# Patient Record
Sex: Female | Born: 1965
Health system: Southern US, Community
[De-identification: ages and names within clinical notes are randomized; demographics above are authoritative.]

## PROBLEM LIST (undated history)

## (undated) DIAGNOSIS — R112 Nausea with vomiting, unspecified: Secondary | ICD-10-CM

## (undated) DIAGNOSIS — K219 Gastro-esophageal reflux disease without esophagitis: Secondary | ICD-10-CM

## (undated) DIAGNOSIS — I10 Essential (primary) hypertension: Secondary | ICD-10-CM

## (undated) DIAGNOSIS — Z9889 Other specified postprocedural states: Secondary | ICD-10-CM

## (undated) HISTORY — PX: FOOT SURGERY: SHX648

## (undated) HISTORY — DX: Gastro-esophageal reflux disease without esophagitis: K21.9

## (undated) HISTORY — PX: BREAST CYST ASPIRATION: SHX578

## (undated) HISTORY — PX: CERVICAL SPINE SURGERY: SHX589

---

## 1999-12-17 ENCOUNTER — Ambulatory Visit (HOSPITAL_COMMUNITY): Admission: RE | Admit: 1999-12-17 | Discharge: 1999-12-17 | Payer: Self-pay | Admitting: Obstetrics and Gynecology

## 2000-02-26 ENCOUNTER — Inpatient Hospital Stay (HOSPITAL_COMMUNITY): Admission: AD | Admit: 2000-02-26 | Discharge: 2000-02-29 | Payer: Self-pay | Admitting: Obstetrics and Gynecology

## 2000-02-29 ENCOUNTER — Encounter: Payer: Self-pay | Admitting: Obstetrics and Gynecology

## 2000-02-29 ENCOUNTER — Encounter: Payer: Self-pay | Admitting: *Deleted

## 2000-03-01 ENCOUNTER — Encounter: Admission: RE | Admit: 2000-03-01 | Discharge: 2000-04-16 | Payer: Self-pay | Admitting: Obstetrics and Gynecology

## 2000-04-10 ENCOUNTER — Other Ambulatory Visit: Admission: RE | Admit: 2000-04-10 | Discharge: 2000-04-10 | Payer: Self-pay | Admitting: Obstetrics and Gynecology

## 2000-06-11 ENCOUNTER — Ambulatory Visit (HOSPITAL_COMMUNITY): Admission: RE | Admit: 2000-06-11 | Discharge: 2000-06-11 | Payer: Self-pay | Admitting: Neurosurgery

## 2000-06-11 ENCOUNTER — Encounter: Payer: Self-pay | Admitting: Neurosurgery

## 2000-10-24 ENCOUNTER — Encounter: Payer: Self-pay | Admitting: Neurosurgery

## 2000-10-30 ENCOUNTER — Encounter: Payer: Self-pay | Admitting: Neurosurgery

## 2000-10-30 ENCOUNTER — Inpatient Hospital Stay (HOSPITAL_COMMUNITY): Admission: RE | Admit: 2000-10-30 | Discharge: 2000-10-31 | Payer: Self-pay | Admitting: Neurosurgery

## 2000-11-11 ENCOUNTER — Encounter: Admission: RE | Admit: 2000-11-11 | Discharge: 2000-11-11 | Payer: Self-pay | Admitting: Neurosurgery

## 2000-11-11 ENCOUNTER — Encounter: Payer: Self-pay | Admitting: Neurosurgery

## 2000-12-12 ENCOUNTER — Encounter: Admission: RE | Admit: 2000-12-12 | Discharge: 2000-12-12 | Payer: Self-pay | Admitting: Neurosurgery

## 2000-12-12 ENCOUNTER — Encounter: Payer: Self-pay | Admitting: Neurosurgery

## 2001-05-18 ENCOUNTER — Other Ambulatory Visit: Admission: RE | Admit: 2001-05-18 | Discharge: 2001-05-18 | Payer: Self-pay | Admitting: Obstetrics and Gynecology

## 2001-12-11 ENCOUNTER — Inpatient Hospital Stay (HOSPITAL_COMMUNITY): Admission: AD | Admit: 2001-12-11 | Discharge: 2001-12-11 | Payer: Self-pay | Admitting: Obstetrics & Gynecology

## 2002-01-11 ENCOUNTER — Other Ambulatory Visit: Admission: RE | Admit: 2002-01-11 | Discharge: 2002-01-11 | Payer: Self-pay | Admitting: Obstetrics and Gynecology

## 2002-05-05 ENCOUNTER — Ambulatory Visit (HOSPITAL_COMMUNITY): Admission: RE | Admit: 2002-05-05 | Discharge: 2002-05-05 | Payer: Self-pay | Admitting: Obstetrics and Gynecology

## 2002-07-24 ENCOUNTER — Inpatient Hospital Stay (HOSPITAL_COMMUNITY): Admission: AD | Admit: 2002-07-24 | Discharge: 2002-07-26 | Payer: Self-pay | Admitting: Obstetrics and Gynecology

## 2002-09-02 ENCOUNTER — Other Ambulatory Visit: Admission: RE | Admit: 2002-09-02 | Discharge: 2002-09-02 | Payer: Self-pay | Admitting: Obstetrics & Gynecology

## 2003-09-07 ENCOUNTER — Other Ambulatory Visit: Admission: RE | Admit: 2003-09-07 | Discharge: 2003-09-07 | Payer: Self-pay | Admitting: Obstetrics and Gynecology

## 2004-11-20 ENCOUNTER — Other Ambulatory Visit: Admission: RE | Admit: 2004-11-20 | Discharge: 2004-11-20 | Payer: Self-pay | Admitting: Obstetrics and Gynecology

## 2005-12-25 ENCOUNTER — Other Ambulatory Visit: Admission: RE | Admit: 2005-12-25 | Discharge: 2005-12-25 | Payer: Self-pay | Admitting: Obstetrics and Gynecology

## 2006-05-26 ENCOUNTER — Ambulatory Visit (HOSPITAL_COMMUNITY): Admission: RE | Admit: 2006-05-26 | Discharge: 2006-05-26 | Payer: Self-pay | Admitting: Family Medicine

## 2006-06-25 ENCOUNTER — Ambulatory Visit: Payer: Self-pay | Admitting: Internal Medicine

## 2006-06-27 ENCOUNTER — Ambulatory Visit (HOSPITAL_COMMUNITY): Admission: RE | Admit: 2006-06-27 | Discharge: 2006-06-27 | Payer: Self-pay | Admitting: Internal Medicine

## 2006-06-27 ENCOUNTER — Ambulatory Visit: Payer: Self-pay | Admitting: Internal Medicine

## 2006-07-22 ENCOUNTER — Ambulatory Visit: Payer: Self-pay | Admitting: Internal Medicine

## 2010-10-10 ENCOUNTER — Ambulatory Visit: Payer: Self-pay | Admitting: Internal Medicine

## 2010-10-16 DIAGNOSIS — K219 Gastro-esophageal reflux disease without esophagitis: Secondary | ICD-10-CM | POA: Insufficient documentation

## 2010-10-16 DIAGNOSIS — R1013 Epigastric pain: Secondary | ICD-10-CM | POA: Insufficient documentation

## 2010-11-04 HISTORY — PX: BREAST BIOPSY: SHX20

## 2010-12-06 NOTE — Assessment & Plan Note (Signed)
Summary: epigastric pain/ss   Visit Type:  Initial Consult Referring Provider:  Dr. Henderson Cloud Primary Care Provider:  [facility]  Chief Complaint:  epigastric pain.  History of Present Illness:  45 year old lady referred by to Dr. Huntley Dec, OB/GYN  for further evaluation of  fullness/epigastric discomfort. She is in the process returning 3 Hemoccults to Dr. Huntley Dec. She states that she's noticed a fullness behind her xiphoid process over the past couple of weeks. I saw her back in 2007 and made a diagnosis of GERD. She was  doing very nicely on Zegerid 40 orally daily. She ran out of the prescription and went to over-the-counter agents which did not do nearly as well for this nice lady. Now she takes  OTC Zegerid 20 mg daily which is not nearly as effective in combating her reflux symptoms. She has taken 6 TUMS nightly on average. Does not have any dysphagia or odynophagia. She does over induldge in  caffeinated beverages daily and uses a lot of spicy tomato-based products. Prior EGD demonstrated a patulous EG junction ; esophagus appeared normal -  prior gallbladder ultrasound also came back normal. She has not had any melena or hematochezia.  She does not consume alcohol or smoke.  No NSAIDs. No family history of GI neoplasia.   Current Medications (verified): 1)  Zegerid Otc .... One Daily 2)  Zyrtec  (Equate) Allergy .... As Needed 3)  Sudafed .... As Needed 4)  Tums .Marland Kitchen.. 2-6 At Bedtime 5)  Tylenol .... As Needed  Allergies (verified): 1)  ! 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 Lungs:  clear to auscultation Heart:  regular rate and rhythm without murmur gallop rub Abdomen:  nondistended positive bowel sounds soft she does have minimal epigastric tenderness to palpation. I do not appreciate a mass hernia organomegaly or other abnormality.  Impression & Recommendations: Impression: 45 year old lady with long-standing GERD more poorly controlled recently with dietary indiscretion and inadequate acid suppression therapy. She reports a fullness/bulge in her epigastric area. Objectively, she has only mild tenderness in this region without other abnormality appreciated. I suspect her symptoms are more related to poorly controlled GERD and anything else at this time. I doubt we are dealing with occult gallbladder or  peptic ulcer disease.  She is self insured and acquired her medications is somewhat challenging.  Recommendations:Two-week trial of AcipHex 20 mg orally daily; I gave her prescription  and coupon to get 2 weeks' worth of therapy for free. I have also given her a prescription for generic Zegerid 40 mg capsules once daily to be started when she runs out of the AcipHex.  Reviewed antireflux lifestyle/diet. Provided her literature.  I've asked her to make contact with me in 2 weeks to let me know how she is doing. If she has a nice response in her symptoms with acid suppression  therapy, then no further evaluation would be warranted. If not, then further evaluation will be pursued. In addition, I would like to get the results of the Hemoccults testing being done through Dr. Esperanza Richters office.  I want to thank Dr. Henderson Cloud for allowing me to see this nice lady once again today.  Appended Document: Orders Update    Clinical Lists Changes  Problems: Added new problem of GERD (ICD-530.81) Added new problem of EPIGASTRIC PAIN (ICD-789.06) Orders: Added new Service order of Consultation Level IV [phone]) - Signed

## 2011-01-16 ENCOUNTER — Encounter (INDEPENDENT_AMBULATORY_CARE_PROVIDER_SITE_OTHER): Payer: Self-pay | Admitting: *Deleted

## 2011-01-22 NOTE — Letter (Signed)
Summary: Recall Office Visit  [facility]  [address]   [place], [place] [address]   Phone: [phone]  Fax: [phone]      January 16, 2011   LYNSAY FESPERMAN [address] [place], [place]  [address] [birth year]   Dear Ms. Holzmann,   According to our records, it is time for you to schedule a follow-up office visit with [place].   At your convenience, please call [phone] to schedule an office visit. If you have any questions, concerns, or feel that this letter is in error, we would appreciate your call.   Sincerely,    Diana Eves  [facility] Ph: [phone]   Fax: [phone]

## 2011-03-20 ENCOUNTER — Other Ambulatory Visit: Payer: Self-pay | Admitting: Obstetrics and Gynecology

## 2011-03-20 DIAGNOSIS — N6325 Unspecified lump in the left breast, overlapping quadrants: Secondary | ICD-10-CM

## 2011-03-26 ENCOUNTER — Ambulatory Visit: Payer: Self-pay

## 2011-03-26 ENCOUNTER — Ambulatory Visit
Admission: RE | Admit: 2011-03-26 | Discharge: 2011-03-26 | Disposition: A | Discharge status: Home / Self Care | Payer: Self-pay | Source: Ambulatory Visit | Attending: Obstetrics and Gynecology | Admitting: Obstetrics and Gynecology

## 2011-03-26 ENCOUNTER — Other Ambulatory Visit: Payer: Self-pay | Admitting: Obstetrics and Gynecology

## 2011-03-26 DIAGNOSIS — N6325 Unspecified lump in the left breast, overlapping quadrants: Secondary | ICD-10-CM

## 2011-04-02 ENCOUNTER — Other Ambulatory Visit: Payer: Self-pay | Admitting: Diagnostic Radiology

## 2011-04-02 ENCOUNTER — Ambulatory Visit
Admission: RE | Admit: 2011-04-02 | Discharge: 2011-04-02 | Disposition: A | Discharge status: Home / Self Care | Payer: Self-pay | Source: Ambulatory Visit | Attending: Obstetrics and Gynecology | Admitting: Obstetrics and Gynecology

## 2011-04-02 DIAGNOSIS — N6325 Unspecified lump in the left breast, overlapping quadrants: Secondary | ICD-10-CM

## 2012-04-08 ENCOUNTER — Other Ambulatory Visit: Payer: Self-pay | Admitting: Obstetrics and Gynecology

## 2012-04-08 DIAGNOSIS — R928 Other abnormal and inconclusive findings on diagnostic imaging of breast: Secondary | ICD-10-CM

## 2012-04-15 ENCOUNTER — Ambulatory Visit
Admission: RE | Admit: 2012-04-15 | Discharge: 2012-04-15 | Disposition: A | Discharge status: Home / Self Care | Payer: Self-pay | Source: Ambulatory Visit | Attending: Obstetrics and Gynecology | Admitting: Obstetrics and Gynecology

## 2012-04-15 ENCOUNTER — Other Ambulatory Visit: Payer: Self-pay | Admitting: Obstetrics and Gynecology

## 2012-04-15 DIAGNOSIS — R928 Other abnormal and inconclusive findings on diagnostic imaging of breast: Secondary | ICD-10-CM

## 2012-06-04 ENCOUNTER — Encounter (INDEPENDENT_AMBULATORY_CARE_PROVIDER_SITE_OTHER): Payer: Self-pay | Admitting: *Deleted

## 2012-06-15 ENCOUNTER — Other Ambulatory Visit (INDEPENDENT_AMBULATORY_CARE_PROVIDER_SITE_OTHER): Payer: Self-pay | Admitting: *Deleted

## 2012-06-15 ENCOUNTER — Encounter (INDEPENDENT_AMBULATORY_CARE_PROVIDER_SITE_OTHER): Payer: Self-pay | Admitting: Internal Medicine

## 2012-06-15 ENCOUNTER — Encounter (INDEPENDENT_AMBULATORY_CARE_PROVIDER_SITE_OTHER): Payer: Self-pay | Admitting: *Deleted

## 2012-06-15 ENCOUNTER — Ambulatory Visit (INDEPENDENT_AMBULATORY_CARE_PROVIDER_SITE_OTHER): Payer: Self-pay | Admitting: Internal Medicine

## 2012-06-15 VITALS — BP 100/62 | HR 72 | Temp 98.1°F | Ht 63.0 in | Wt 133.1 lb

## 2012-06-15 DIAGNOSIS — R195 Other fecal abnormalities: Secondary | ICD-10-CM | POA: Insufficient documentation

## 2012-06-15 DIAGNOSIS — R222 Localized swelling, mass and lump, trunk: Secondary | ICD-10-CM | POA: Insufficient documentation

## 2012-06-15 DIAGNOSIS — R1900 Intra-abdominal and pelvic swelling, mass and lump, unspecified site: Secondary | ICD-10-CM

## 2012-06-15 NOTE — Progress Notes (Signed)
Subjective:     Patient ID: Stephanie Bishop, female   DOB: June 01, 1966, 46 y.o.   MRN: [id number]  HPI Referred to our office by Harold Hedge for  Hem positive stools. This test involves having a BM and then dropping a special piece of chemical treated paper over the stool. The third test was positive and she had her period. Appetite is good. No weight loss. She has acid reflux and takes PPI for this. She occasionally has lower abdominal pain.  She usually has a BM x 3-4 a day, usually after a meal. Stools are brown in color. No melena or bright red rectal bleeding. There is no family hx of colon cancer. She also c/o midline mass for several years.    Review of Systems see hpi Current Outpatient Prescriptions  Medication Sig Dispense Refill  . cetirizine (ZYRTEC) 10 MG tablet Take 10 mg by mouth daily.      Maxwell Caul Bicarbonate (ZEGERID) 20-1100 MG CAPS Take 1 capsule by mouth daily before breakfast.       Past Medical History  Diagnosis Date  . GERD (gastroesophageal reflux disease)    . Past Surgical History  Procedure Date  . Cervical spine surgery     2000/04/19   Allergies  Allergen Reactions  . Ciprofloxacin    Family Status  Relation Status Death Age  . Mother Alive     good health  . Father Deceased     Apr 19, 2008 COPD  . Brother Alive     One has CAD, hypertension, One has had a CVA and is in a nursing home   History   Social History  . Marital Status: Married    Spouse Name: N/A    Number of Children: N/A  . Years of Education: N/A   Occupational History  . Not on file.   Social History Main Topics  . Smoking status: Never Smoker   . Smokeless tobacco: Not on file  . Alcohol Use: No  . Drug Use: No  . Sexually Active: Not on file   Other Topics Concern  . Not on file   Social History Narrative  . No narrative on file        Objective:   Physical Exam  Filed Vitals:   06/15/12 1017  Height: 5\' 3"  (1.6 m)  Weight: 133 lb 1.6 oz (60.374 kg)     Alert and oriented. Skin warm and dry. Oral mucosa is moist.   . Sclera anicteric, conjunctivae is pink. Thyroid not enlarged. No cervical lymphadenopathy. Lungs clear. Heart regular rate and rhythm.  Abdomen is soft. Bowel sounds are positive. No hepatomegaly. No abdominal masses felt. No tenderness. Epigastric area firmer to the touch, probably muscle. Stool brown and guaiac negative.  No edema to lower extremities.   Dr Karilyn Cota in to exam patient.    Assessment:    Guaiac positive stool per history. Colonic neoplasm needs to be ruled out.  Abdominal mid line abdominal mass.     Plan:   Colonoscopy. US abdomen.? Abdominal wall mass midline.The risks and benefits such as perforation, bleeding, and infection were reviewed with the patient and is agreeable.

## 2012-06-15 NOTE — Telephone Encounter (Signed)
This encounter was created in error - please disregard.

## 2012-06-15 NOTE — Patient Instructions (Signed)
Colonoscopy. The risks and benefits such as perforation, bleeding, and infection were reviewed with the patient and is agreeable.  US abdomen to rule out a mass.

## 2012-06-16 ENCOUNTER — Ambulatory Visit (HOSPITAL_COMMUNITY)
Admission: RE | Admit: 2012-06-16 | Discharge: 2012-06-16 | Disposition: A | Discharge status: Home / Self Care | Payer: Self-pay | Source: Ambulatory Visit | Attending: Internal Medicine | Admitting: Internal Medicine

## 2012-06-16 DIAGNOSIS — R1909 Other intra-abdominal and pelvic swelling, mass and lump: Secondary | ICD-10-CM | POA: Insufficient documentation

## 2012-06-16 DIAGNOSIS — R1900 Intra-abdominal and pelvic swelling, mass and lump, unspecified site: Secondary | ICD-10-CM

## 2012-06-30 ENCOUNTER — Encounter (HOSPITAL_COMMUNITY): Payer: Self-pay | Admitting: Pharmacy Technician

## 2012-07-15 MED ORDER — SODIUM CHLORIDE 0.45 % IV SOLN
INTRAVENOUS | Status: DC
  Administered 2012-07-16: 09:00:00 via INTRAVENOUS

## 2012-07-16 ENCOUNTER — Encounter (HOSPITAL_COMMUNITY): Payer: Self-pay | Admitting: *Deleted

## 2012-07-16 ENCOUNTER — Ambulatory Visit (HOSPITAL_COMMUNITY)
Admission: RE | Admit: 2012-07-16 | Discharge: 2012-07-16 | Disposition: A | Discharge status: Home Health | Payer: Self-pay | Source: Ambulatory Visit | Attending: Internal Medicine | Admitting: Internal Medicine

## 2012-07-16 ENCOUNTER — Encounter (HOSPITAL_COMMUNITY)
Admission: RE | Disposition: A | Discharge status: Home Health | Payer: Self-pay | Source: Ambulatory Visit | Attending: Internal Medicine

## 2012-07-16 DIAGNOSIS — K5730 Diverticulosis of large intestine without perforation or abscess without bleeding: Secondary | ICD-10-CM

## 2012-07-16 DIAGNOSIS — R195 Other fecal abnormalities: Secondary | ICD-10-CM

## 2012-07-16 DIAGNOSIS — K921 Melena: Secondary | ICD-10-CM

## 2012-07-16 DIAGNOSIS — K644 Residual hemorrhoidal skin tags: Secondary | ICD-10-CM | POA: Insufficient documentation

## 2012-07-16 HISTORY — PX: COLONOSCOPY: SHX5424

## 2012-07-16 HISTORY — DX: Other specified postprocedural states: Z98.890

## 2012-07-16 HISTORY — DX: Other specified postprocedural states: R11.2

## 2012-07-16 SURGERY — COLONOSCOPY
Anesthesia: Moderate Sedation

## 2012-07-16 MED ORDER — MEPERIDINE HCL 50 MG/ML IJ SOLN
INTRAMUSCULAR | Status: AC
  Filled 2012-07-16: qty 1

## 2012-07-16 MED ORDER — MIDAZOLAM HCL 5 MG/5ML IJ SOLN
INTRAMUSCULAR | Status: AC
  Filled 2012-07-16: qty 10

## 2012-07-16 MED ORDER — ONDANSETRON 4 MG PO TBDP
8.0000 mg | ORAL_TABLET | Freq: Once | ORAL | Status: AC
  Administered 2012-07-16: 8 mg via ORAL

## 2012-07-16 MED ORDER — MEPERIDINE HCL 50 MG/ML IJ SOLN
INTRAMUSCULAR | Status: DC | PRN
  Administered 2012-07-16: 20 mg via INTRAVENOUS
  Administered 2012-07-16: 10 mg via INTRAVENOUS

## 2012-07-16 MED ORDER — STERILE WATER FOR IRRIGATION IR SOLN
Status: DC | PRN
  Administered 2012-07-16: 09:00:00

## 2012-07-16 MED ORDER — ONDANSETRON 4 MG PO TBDP
ORAL_TABLET | ORAL | Status: AC
  Filled 2012-07-16: qty 2

## 2012-07-16 MED ORDER — MIDAZOLAM HCL 5 MG/5ML IJ SOLN
INTRAMUSCULAR | Status: DC | PRN
  Administered 2012-07-16 (×4): 2 mg via INTRAVENOUS

## 2012-07-16 NOTE — Op Note (Signed)
COLONOSCOPY PROCEDURE REPORT  PATIENT:  Stephanie Bishop  MR#:  [id number] Birthdate:  1966/08/15, 46 y.o., female Endoscopist:  Dr. Malissa Hippo, MD Referred By:  Dr. Donna Bernard, MD Procedure Date: 07/16/2012  Procedure:   Colonoscopy.  Indications:  Patient is 46 year old Caucasian female stone have heme positive stools. She has no symptoms. Her heartburn is well controlled with therapy. She is undergoing diagnostic colonoscopy.  Informed Consent:  The procedure and risks were reviewed with the patient and informed consent was obtained.  Medications:  Demerol 30 mg IV Versed 8 mg IV  Description of procedure:  After a digital rectal exam was performed, that colonoscope was advanced from the anus through the rectum and colon to the area of the cecum, ileocecal valve and appendiceal orifice. The cecum was deeply intubated. These structures were well-seen and photographed for the record. From the level of the cecum and ileocecal valve, the scope was slowly and cautiously withdrawn. The mucosal surfaces were carefully surveyed utilizing scope tip to flexion to facilitate fold flattening as needed. The scope was pulled down into the rectum where a thorough exam including retroflexion was performed.  Findings:   Prep excellent. Very tortuous sigmoid colon with single diverticulum. No polyps or other mucosal abnormalities noted. Normal rectal mucosa. Small hemorrhoids below the dentate line.  Therapeutic/Diagnostic Maneuvers Performed:  None  Complications:  None  Cecal Withdrawal Time:  8 minutes  Impression:  Examination performed to cecum. Single diverticulum at sigmoid colon and external hemorrhoids otherwise normal examination.  Recommendations:  Standard instructions given. She will have CBC and Hemoccults at the time of her next physical examination.  REHMAN,NAJEEB U  07/16/2012 9:54 AM  CC: Dr. Harlow Asa, MD & Dr. Bonnetta Barry ref. provider found

## 2012-07-16 NOTE — H&P (Signed)
Stephanie Bishop is an 46 y.o. female.   Chief Complaint: Patient is here for colonoscopy. HPI: Patient is 88 Caucasian female was noted to have heme positive stool. She did test herself at home. There is no history of melena or rectal bleeding abdominal pain. Family history is negative for colorectal carcinoma. She has noted fullness in epigastrium which is felt to be secondary to thickened linea alba.  Past Medical History  Diagnosis Date  . GERD (gastroesophageal reflux disease)   . PONV (postoperative nausea and vomiting)     Past Surgical History  Procedure Date  . Cervical spine surgery     2001    Family History  Problem Relation Age of Onset  . Colon cancer Neg Hx    Social History:  reports that she has never smoked. She does not have any smokeless tobacco history on file. She reports that she does not drink alcohol or use illicit drugs.  Allergies:  Allergies  Allergen Reactions  . Ciprofloxacin Other (See Comments)    Knots on Face     Medications Prior to Admission  Medication Sig Dispense Refill  . calcium-vitamin D (OSCAL WITH D) 500-200 MG-UNIT per tablet Take 1 tablet by mouth daily.      . calcium carbonate (TUMS - DOSED IN MG ELEMENTAL CALCIUM) 500 MG chewable tablet Chew 2 tablets by mouth at bedtime.      . cetirizine (ZYRTEC) 10 MG tablet Take 10 mg by mouth daily as needed.       Stephanie Bishop Bicarbonate (ZEGERID) 20-1100 MG CAPS Take 1 capsule by mouth daily.         No results found for this or any previous visit (from the past 48 hour(s)). No results found.  ROS  Blood pressure 114/73, pulse 86, temperature 98 F (36.7 C), temperature source Oral, resp. rate 14, height 5\' 3"  (1.6 m), weight 133 lb (60.328 kg), last menstrual period 07/07/2012, SpO2 100.00%. Physical Exam  Constitutional: She appears well-developed and well-nourished.  HENT:  Mouth/Throat: Oropharynx is clear and moist.  Eyes: Conjunctivae normal are normal. No scleral  icterus.  Neck: No thyromegaly present.  Cardiovascular: Normal rate, regular rhythm and normal heart sounds.   No murmur heard. Respiratory: Effort normal and breath sounds normal.  GI: Soft. She exhibits no distension and no mass. There is no tenderness.  Musculoskeletal: She exhibits no edema.  Lymphadenopathy:    She has no cervical adenopathy.  Neurological: She is alert.  Skin: Skin is warm and dry.     Assessment/Plan Heme positive stool. Diagnostic colonoscopy.  REHMAN,NAJEEB U 07/16/2012, 9:12 AM

## 2012-07-22 ENCOUNTER — Encounter (HOSPITAL_COMMUNITY): Payer: Self-pay | Admitting: Internal Medicine

## 2013-06-17 ENCOUNTER — Other Ambulatory Visit: Payer: Self-pay | Admitting: Obstetrics and Gynecology

## 2013-06-24 ENCOUNTER — Other Ambulatory Visit: Payer: Self-pay | Admitting: Obstetrics and Gynecology

## 2013-06-24 DIAGNOSIS — R928 Other abnormal and inconclusive findings on diagnostic imaging of breast: Secondary | ICD-10-CM

## 2013-07-13 ENCOUNTER — Ambulatory Visit
Admission: RE | Admit: 2013-07-13 | Discharge: 2013-07-13 | Disposition: A | Discharge status: Home / Self Care | Payer: Self-pay | Source: Ambulatory Visit | Attending: Obstetrics and Gynecology | Admitting: Obstetrics and Gynecology

## 2013-07-13 DIAGNOSIS — R928 Other abnormal and inconclusive findings on diagnostic imaging of breast: Secondary | ICD-10-CM

## 2013-08-23 ENCOUNTER — Encounter: Payer: Self-pay | Admitting: Family Medicine

## 2013-08-23 ENCOUNTER — Ambulatory Visit (INDEPENDENT_AMBULATORY_CARE_PROVIDER_SITE_OTHER): Payer: Self-pay | Admitting: Family Medicine

## 2013-08-23 VITALS — BP 120/88 | Temp 98.5°F | Ht 63.5 in | Wt 139.2 lb

## 2013-08-23 DIAGNOSIS — J019 Acute sinusitis, unspecified: Secondary | ICD-10-CM

## 2013-08-23 MED ORDER — AMOXICILLIN 500 MG PO TABS: 500.0000 mg | ORAL_TABLET | Freq: Two times a day (BID) | ORAL | Status: DC

## 2013-08-23 NOTE — Progress Notes (Signed)
  Subjective:    Patient ID: Stephanie Bishop, female    DOB: Oct 18, 1966, 47 y.o.   MRN: [id number]  Sinusitis This is a new problem. The current episode started in the past 7 days. The problem has been gradually worsening since onset. Maximum temperature: Low grade. Associated symptoms include congestion, coughing, ear pain, headaches, sinus pressure, sneezing and a sore throat. Past treatments include acetaminophen and oral decongestants. The treatment provided mild relief.   Started on the 16th with head sypmtoms then sinus headache Low grade fever No sweats   Review of Systems  HENT: Positive for congestion, ear pain, sinus pressure, sneezing and sore throat.   Respiratory: Positive for cough.   Neurological: Positive for headaches.       Objective:   Physical Exam  Nursing note and vitals reviewed. Constitutional: She appears well-developed.  HENT:  Head: Normocephalic.  Nose: Nose normal.  Mouth/Throat: Oropharynx is clear and moist. No oropharyngeal exudate.  Neck: Neck supple.  Cardiovascular: Normal rate and normal heart sounds.   No murmur heard. Pulmonary/Chest: Effort normal and breath sounds normal. She has no wheezes.  Lymphadenopathy:    She has no cervical adenopathy.  Skin: Skin is warm and dry.          Assessment & Plan:  Sinusitis antibiotics prescribed warning signs discussed call if problems

## 2013-12-14 ENCOUNTER — Other Ambulatory Visit: Payer: Self-pay | Admitting: Obstetrics and Gynecology

## 2013-12-14 DIAGNOSIS — N6320 Unspecified lump in the left breast, unspecified quadrant: Secondary | ICD-10-CM

## 2013-12-20 ENCOUNTER — Encounter: Payer: Self-pay | Admitting: Family Medicine

## 2013-12-20 ENCOUNTER — Ambulatory Visit (INDEPENDENT_AMBULATORY_CARE_PROVIDER_SITE_OTHER): Payer: BC Managed Care – PPO | Admitting: Family Medicine

## 2013-12-20 VITALS — BP 112/70 | Temp 99.8°F | Ht 63.0 in | Wt 141.0 lb

## 2013-12-20 DIAGNOSIS — H6693 Otitis media, unspecified, bilateral: Secondary | ICD-10-CM

## 2013-12-20 DIAGNOSIS — H6690 Otitis media, unspecified, unspecified ear: Secondary | ICD-10-CM

## 2013-12-20 MED ORDER — AMOXICILLIN-POT CLAVULANATE 875-125 MG PO TABS: 1.0000 | ORAL_TABLET | Freq: Two times a day (BID) | ORAL | Status: AC

## 2013-12-20 NOTE — Progress Notes (Signed)
   Subjective:    Patient ID: Stephanie Bishop, female    DOB: [birth year], 48 y.o.   MRN: [id number]  Cough This is a new problem. The current episode started in the past 7 days. Associated symptoms include ear pain, a fever, headaches and myalgias. Associated symptoms comments: Diarrhea, fatigue. Treatments tried: tylenol, OTC cough med. The treatment provided no relief.   High fevers  Both ears pain,  t max 103.6  Sig headache and pressure,  Bad cough  Hurts and aches to cough No hx of ear infxns.  Energy  And app no taste   Review of Systems  Constitutional: Positive for fever.  HENT: Positive for ear pain.   Respiratory: Positive for cough.   Musculoskeletal: Positive for myalgias.  Neurological: Positive for headaches.       Objective:   Physical Exam  Alert moderate malaise. Intermittent cough during exam. Positive nasal discharge. TMs bilateral erythema effusion. Pharynx normal neck supple. Lungs clear. Heart regular in rhythm.      Assessment & Plan:  Impression post flu bilateral otitis media discussed at length plan antibiotics prescribed. Symptomatic care discussed. Hycodan when necessary for pain and cough. WSL

## 2013-12-22 ENCOUNTER — Other Ambulatory Visit: Payer: Self-pay

## 2013-12-23 ENCOUNTER — Ambulatory Visit (INDEPENDENT_AMBULATORY_CARE_PROVIDER_SITE_OTHER): Payer: BC Managed Care – PPO | Admitting: General Surgery

## 2013-12-30 ENCOUNTER — Other Ambulatory Visit: Payer: Self-pay

## 2013-12-31 ENCOUNTER — Encounter: Payer: Self-pay | Admitting: Family Medicine

## 2013-12-31 ENCOUNTER — Ambulatory Visit (INDEPENDENT_AMBULATORY_CARE_PROVIDER_SITE_OTHER): Payer: BC Managed Care – PPO | Admitting: General Surgery

## 2013-12-31 ENCOUNTER — Ambulatory Visit (INDEPENDENT_AMBULATORY_CARE_PROVIDER_SITE_OTHER): Payer: BC Managed Care – PPO | Admitting: Family Medicine

## 2013-12-31 VITALS — BP 122/72 | Ht 63.0 in | Wt 142.2 lb

## 2013-12-31 DIAGNOSIS — J019 Acute sinusitis, unspecified: Secondary | ICD-10-CM

## 2013-12-31 MED ORDER — SULFAMETHOXAZOLE-TMP DS 800-160 MG PO TABS: 1.0000 | ORAL_TABLET | Freq: Two times a day (BID) | ORAL | Status: DC

## 2013-12-31 NOTE — Progress Notes (Signed)
   Subjective:    Patient ID: Stephanie Bishop, female    DOB: [birth year], 48 y.o.   MRN: [id number]  HPI  Patient arrives with continued low grade fever and ear pain. Patient has finished antibiotic but still having some ear pain and low grade temp at times. Patient denies any wheezing shortness of breath. States her energy level overall doing okay.  Review of Systems Denies high fever chills sweats nausea vomiting diarrhea.    Objective:   Physical Exam Eardrums are normal sinuses nontender throat is normal neck is supple lungs are clear hearts regular      Assessment & Plan:  Mild sinus infection with referred pain to the ears should gradually get better warning signs discussed call us if any issues  An additional round of antibiotics was sent in.

## 2014-01-04 ENCOUNTER — Ambulatory Visit
Admission: RE | Admit: 2014-01-04 | Discharge: 2014-01-04 | Disposition: A | Discharge status: Home / Self Care | Payer: BC Managed Care – PPO | Source: Ambulatory Visit | Attending: Obstetrics and Gynecology | Admitting: Obstetrics and Gynecology

## 2014-01-04 DIAGNOSIS — N6320 Unspecified lump in the left breast, unspecified quadrant: Secondary | ICD-10-CM

## 2014-01-05 ENCOUNTER — Encounter (INDEPENDENT_AMBULATORY_CARE_PROVIDER_SITE_OTHER): Payer: Self-pay | Admitting: General Surgery

## 2014-01-05 ENCOUNTER — Ambulatory Visit (INDEPENDENT_AMBULATORY_CARE_PROVIDER_SITE_OTHER): Payer: BC Managed Care – PPO | Admitting: General Surgery

## 2014-01-05 ENCOUNTER — Telehealth (INDEPENDENT_AMBULATORY_CARE_PROVIDER_SITE_OTHER): Payer: Self-pay

## 2014-01-05 VITALS — BP 124/80 | HR 76 | Temp 98.4°F | Resp 20 | Ht 63.0 in | Wt 140.0 lb

## 2014-01-05 DIAGNOSIS — R2230 Localized swelling, mass and lump, unspecified upper limb: Secondary | ICD-10-CM

## 2014-01-05 DIAGNOSIS — R229 Localized swelling, mass and lump, unspecified: Secondary | ICD-10-CM

## 2014-01-05 NOTE — Telephone Encounter (Signed)
Patient called back and was given below message.

## 2014-01-05 NOTE — Progress Notes (Signed)
Patient ID: Stephanie Bishop, female   DOB: 1966/05/14, 48 y.o.   MRN: [id number]  Chief Complaint  Patient presents with  . axillary mass    left    HPI Stephanie Bishop is a 48 y.o. female.  Referred by Dr Gaetano Net HPI This is a 48 year old female who noted a left axillary mass in January. This has not bothered her at all. This has no symptoms and has had no history of any infection. She notes no changes or any history in her breasts at all.  She underwent a highway with an ultrasound that is below. She comes in today for evaluation.  Past Medical History  Diagnosis Date  . GERD (gastroesophageal reflux disease)   . PONV (postoperative nausea and vomiting)     Past Surgical History  Procedure Laterality Date  . Cervical spine surgery      2001  . Colonoscopy  07/16/2012    Procedure: COLONOSCOPY;  Surgeon: Rogene Houston, MD;  Location: [facility];  Service: Endoscopy;  Laterality: N/A;  930    Family History  Problem Relation Age of Onset  . Colon cancer Neg Hx     Social History History  Substance Use Topics  . Smoking status: Never Smoker   . Smokeless tobacco: Not on file  . Alcohol Use: No    Allergies  Allergen Reactions  . Ciprofloxacin Other (See Comments)    Knots on Face     Current Outpatient Prescriptions  Medication Sig Dispense Refill  . calcium carbonate (TUMS - DOSED IN MG ELEMENTAL CALCIUM) 500 MG chewable tablet Chew 2 tablets by mouth at bedtime.      . calcium-vitamin D (OSCAL WITH D) 500-200 MG-UNIT per tablet Take 1 tablet by mouth daily.      . cetirizine (ZYRTEC) 10 MG tablet Take 10 mg by mouth daily as needed.       Earney Navy Bicarbonate (ZEGERID) 20-1100 MG CAPS Take 1 capsule by mouth daily.       Marland Kitchen sulfamethoxazole-trimethoprim (BACTRIM DS) 800-160 MG per tablet Take 1 tablet by mouth 2 (two) times daily.  20 tablet  0   No current facility-administered medications for this visit.    Review of Systems Review of Systems    Constitutional: Negative for fever, chills and unexpected weight change.  HENT: Negative for congestion, hearing loss, sore throat, trouble swallowing and voice change.   Eyes: Negative for visual disturbance.  Respiratory: Negative for cough and wheezing.   Cardiovascular: Negative for chest pain, palpitations and leg swelling.  Gastrointestinal: Negative for nausea, vomiting, abdominal pain, diarrhea, constipation, blood in stool, abdominal distention and anal bleeding.  Genitourinary: Negative for hematuria, vaginal bleeding and difficulty urinating.  Musculoskeletal: Negative for arthralgias.  Skin: Negative for rash and wound.  Neurological: Negative for seizures, syncope and headaches.  Hematological: Negative for adenopathy. Does not bruise/bleed easily.  Psychiatric/Behavioral: Negative for confusion.    Blood pressure 124/80, pulse 76, temperature 98.4 F (36.9 C), temperature source Oral, resp. rate 20, height 5\' 3"  (1.6 m), weight 140 lb (63.504 kg).  Physical Exam Physical Exam  Vitals reviewed. Constitutional: She appears well-developed and well-nourished.  Pulmonary/Chest: Left breast exhibits no inverted nipple, no mass, no nipple discharge, no skin change and no tenderness.    Lymphadenopathy:    She has no cervical adenopathy.       Left axillary: No pectoral and no lateral adenopathy present.   Data Reviewed ACR Breast Density Category b: There are  scattered areas of  fibroglandular density.  FINDINGS:  Within the left axillary region a spot tangential view demonstrates  underlying fat containing mass measuring up to 3.9 cm.  Additionally within the left breast upper outer quadrant middle and  posterior depth are 2 new circumscribed masses measuring 1.5 cm and  1.1 cm respectively. These are further evaluated with spot  compression CC and MLO views.  Mammographic images were processed with CAD.  On physical exam, I palpate a soft mass within the left  axillary  region. No discrete mass is palpated within the lateral aspect of  the left breast.  Ultrasound is performed, showing a homogeneously hyperechoic to  isoechoic 3.7 x 3.9 cm mass within the left axillary region  corresponding with palpable abnormality, favored to represent a  benign appearing lipoma or lymph node.  Within the left breast 233 o'clock position 8 cm from the nipple  there is a 2.2 x 1.2 x 0.4 cm cluster of cysts with thin internal  septations. This corresponds with mammographic abnormality.  Additionally within the left breast 3 o'clock position 5 cm from the  nipple there is a 0.9 x 0.6 x 0.9 cm simple cyst. This corresponds  with mammographic abnormality.  IMPRESSION:  1. Palpable abnormality within the left axilla is favored to  represent either benign lipoma or benign lymph node.  2. New lobular mass within the posterior lateral aspect left breast  on mammography corresponds with a cluster of cysts.  3. Simple cyst within the left breast.  RECOMMENDATION:  Diagnostic ultrasound left breast in 6 months to ensure stability of  cluster of microcysts at the 230 o'clock 8 cm from the nipple.    Assessment    Likely left axillary lipoma     Plan    I think clinically this area is pretty clearly a lipoma. Her ultrasound supports that also. We discussed excision but she's not interested in that. I think that that is perfectly fine also. I asked her to call me back if this area increases in size or become symptomatic. Otherwise she's going to continue her regular breast cancer screening with followup in 6 months for reevaluation of her cyst.       , 01/05/2014, 2:30 PM

## 2014-01-05 NOTE — Patient Instructions (Signed)

## 2014-01-05 NOTE — Telephone Encounter (Signed)
Called and left message for patient re: appointment today.  Provider has been changed to Dr. Donne Hazel due to possibility of breast involvement.  Please make patient aware, appointment time still the same.

## 2014-01-07 ENCOUNTER — Other Ambulatory Visit: Payer: Self-pay

## 2014-01-17 ENCOUNTER — Ambulatory Visit (INDEPENDENT_AMBULATORY_CARE_PROVIDER_SITE_OTHER): Payer: BC Managed Care – PPO | Admitting: General Surgery

## 2014-03-03 ENCOUNTER — Telehealth: Payer: Self-pay | Admitting: Family Medicine

## 2014-03-03 MED ORDER — AZITHROMYCIN 250 MG PO TABS: ORAL_TABLET | ORAL | Status: DC

## 2014-03-03 NOTE — Telephone Encounter (Signed)
Patient states she has fever, congestion, cough and sore throat. No SOB or wheezing.

## 2014-03-03 NOTE — Telephone Encounter (Signed)
Patient brought her two children in on Monday and saw Dr Nicki Reaper. Dr Nicki Reaper told her that if she started to show the same symptoms that we would just call her something in because she has her sons wedding this weekend. Patient is having a low grade fever, a lot of congestion and coughing.  [facility]

## 2014-03-03 NOTE — Telephone Encounter (Signed)
Medication sent to pharmacy. Patient was notified.  

## 2014-03-03 NOTE — Telephone Encounter (Signed)
Please call in Zithromax-Z-Pak use over the next 5 days. Hopefully patient turns corner quickly. If problems call us.

## 2014-04-12 ENCOUNTER — Other Ambulatory Visit: Payer: Self-pay | Admitting: Obstetrics and Gynecology

## 2014-04-12 DIAGNOSIS — N63 Unspecified lump in unspecified breast: Secondary | ICD-10-CM

## 2014-04-18 ENCOUNTER — Encounter (INDEPENDENT_AMBULATORY_CARE_PROVIDER_SITE_OTHER): Payer: Self-pay | Admitting: *Deleted

## 2014-04-18 ENCOUNTER — Ambulatory Visit (INDEPENDENT_AMBULATORY_CARE_PROVIDER_SITE_OTHER): Payer: BC Managed Care – PPO | Admitting: Internal Medicine

## 2014-04-18 ENCOUNTER — Encounter (INDEPENDENT_AMBULATORY_CARE_PROVIDER_SITE_OTHER): Payer: Self-pay | Admitting: Internal Medicine

## 2014-04-18 VITALS — BP 100/58 | HR 80 | Temp 98.7°F | Ht 63.0 in | Wt 142.7 lb

## 2014-04-18 DIAGNOSIS — R109 Unspecified abdominal pain: Secondary | ICD-10-CM

## 2014-04-18 DIAGNOSIS — K469 Unspecified abdominal hernia without obstruction or gangrene: Secondary | ICD-10-CM | POA: Insufficient documentation

## 2014-04-18 NOTE — Patient Instructions (Signed)
US abdomen. If no change, will wait.  [facility] in [place]

## 2014-04-18 NOTE — Progress Notes (Signed)
Subjective:     Patient ID: Stephanie Bishop, female   DOB: [birth year], 48 y.o.   MRN: [id number]  HPI She c/o weight gain. She has gained about9 pounds since her last visit in September of 2013. She tells me she has a dull ache in her mid abdomen.\ The pain is worse when lifting objects or stooping over.  She says it feels like a dull ache. She is anxious to be referred to a Psychologist, sport and exercise. Appetite is good. No weight loss. (She has gained 9 pounds over the past 2 yrs.) She has a BM 3-4 times a day. No melena or bright red rectal bleeding.     07/16/2012 Colonoscopy: Impression:  Examination performed to cecum.  Single diverticulum at sigmoid colon and external hemorrhoids otherwise normal examination.  06/15/2012 US abdomen: IMPRESSION:  Supraumbilical mass represents a midline hernia containing fat.     Review of Systems     Past Medical History  Diagnosis Date  . GERD (gastroesophageal reflux disease)   . PONV (postoperative nausea and vomiting)     Past Surgical History  Procedure Laterality Date  . Cervical spine surgery      2001  . Colonoscopy  07/16/2012    Procedure: COLONOSCOPY;  Surgeon: Rogene Houston, MD;  Location: [facility];  Service: Endoscopy;  Laterality: N/A;  930    Allergies  Allergen Reactions  . Ciprofloxacin Other (See Comments)    Knots on Face     Current Outpatient Prescriptions on File Prior to Visit  Medication Sig Dispense Refill  . cetirizine (ZYRTEC) 10 MG tablet Take 10 mg by mouth daily as needed.       Earney Navy Bicarbonate (ZEGERID) 20-1100 MG CAPS Take 1 capsule by mouth as needed.        No current facility-administered medications on file prior to visit.    Past Medical History  Diagnosis Date  . GERD (gastroesophageal reflux disease)   . PONV (postoperative nausea and vomiting)     Past Surgical History  Procedure Laterality Date  . Cervical spine surgery      2001  . Colonoscopy  07/16/2012    Procedure:  COLONOSCOPY;  Surgeon: Rogene Houston, MD;  Location: [facility];  Service: Endoscopy;  Laterality: N/A;  930    Allergies  Allergen Reactions  . Ciprofloxacin Other (See Comments)    Knots on Face     Current Outpatient Prescriptions on File Prior to Visit  Medication Sig Dispense Refill  . cetirizine (ZYRTEC) 10 MG tablet Take 10 mg by mouth daily as needed.       Earney Navy Bicarbonate (ZEGERID) 20-1100 MG CAPS Take 1 capsule by mouth as needed.        No current facility-administered medications on file prior to visit.     Objective:   Physical Exam  Filed Vitals:   04/18/14 1436  BP: 100/58  Pulse: 80  Temp: 98.7 F (37.1 C)  Height: 5\' 3"  (1.6 m)  Weight: 142 lb 11.2 oz (64.728 kg)   Alert and oriented. Skin warm and dry. Oral mucosa is moist.   . Sclera anicteric, conjunctivae is pink. Thyroid not enlarged. No cervical lymphadenopathy. Lungs clear. Heart regular rate and rhythm.  Abdomen is soft. Bowel sounds are positive. No hepatomegaly. No abdominal masses felt. Very slight mid abdominal tenderness above the umbilicus.   No edema to lower extremities.     Assessment:    Assessment:  IMPRESSION:  Supraumbilical mass represents  a midline hernia containing fat. Patient requesting f/u.       Plan:     US abdomen. If normal, will watch. If there is enlargement: patient request referral to [facility].

## 2014-04-20 ENCOUNTER — Ambulatory Visit (HOSPITAL_COMMUNITY)
Admission: RE | Admit: 2014-04-20 | Discharge: 2014-04-20 | Disposition: A | Discharge status: Home / Self Care | Payer: BC Managed Care – PPO | Source: Ambulatory Visit | Attending: Internal Medicine | Admitting: Internal Medicine

## 2014-04-20 DIAGNOSIS — R109 Unspecified abdominal pain: Secondary | ICD-10-CM

## 2014-04-20 DIAGNOSIS — K439 Ventral hernia without obstruction or gangrene: Secondary | ICD-10-CM | POA: Insufficient documentation

## 2014-04-21 ENCOUNTER — Ambulatory Visit
Admission: RE | Admit: 2014-04-21 | Discharge: 2014-04-21 | Disposition: A | Discharge status: Home / Self Care | Payer: BC Managed Care – PPO | Source: Ambulatory Visit | Attending: Obstetrics and Gynecology | Admitting: Obstetrics and Gynecology

## 2014-04-21 ENCOUNTER — Other Ambulatory Visit: Payer: Self-pay | Admitting: Obstetrics and Gynecology

## 2014-04-21 DIAGNOSIS — N63 Unspecified lump in unspecified breast: Secondary | ICD-10-CM

## 2014-06-24 ENCOUNTER — Other Ambulatory Visit: Payer: Self-pay | Admitting: Obstetrics and Gynecology

## 2014-06-27 LAB — CYTOLOGY - PAP

## 2014-08-08 ENCOUNTER — Other Ambulatory Visit: Payer: Self-pay | Admitting: Nurse Practitioner

## 2014-08-08 ENCOUNTER — Telehealth: Payer: Self-pay | Admitting: Nurse Practitioner

## 2014-08-08 MED ORDER — AZITHROMYCIN 250 MG PO TABS: ORAL_TABLET | ORAL | Status: DC

## 2014-08-08 NOTE — Telephone Encounter (Signed)
Patients children (Stephanie Bishop and Stephanie Bishop) were seen on 08/05/14 and [name] said that if she started showing symptoms that her children were showing, we could call her something in.  She is having congestion, ear ache, low grade fever, sore throat   [facility] [place]

## 2014-08-08 NOTE — Telephone Encounter (Signed)
Patient notified

## 2014-08-08 NOTE — Telephone Encounter (Signed)
Daughter treated for strep; will call in antibiotic

## 2014-08-23 ENCOUNTER — Other Ambulatory Visit: Payer: Self-pay

## 2014-08-23 DIAGNOSIS — Z1231 Encounter for screening mammogram for malignant neoplasm of breast: Secondary | ICD-10-CM

## 2014-09-08 ENCOUNTER — Ambulatory Visit
Admission: RE | Admit: 2014-09-08 | Discharge: 2014-09-08 | Disposition: A | Discharge status: Home / Self Care | Payer: BC Managed Care – PPO | Source: Ambulatory Visit

## 2014-09-08 ENCOUNTER — Encounter (INDEPENDENT_AMBULATORY_CARE_PROVIDER_SITE_OTHER): Payer: Self-pay

## 2014-09-08 DIAGNOSIS — Z1231 Encounter for screening mammogram for malignant neoplasm of breast: Secondary | ICD-10-CM

## 2015-07-12 ENCOUNTER — Other Ambulatory Visit: Payer: Self-pay | Admitting: Obstetrics and Gynecology

## 2015-07-14 LAB — CYTOLOGY - PAP

## 2015-08-07 ENCOUNTER — Other Ambulatory Visit: Payer: Self-pay

## 2015-08-07 DIAGNOSIS — Z1231 Encounter for screening mammogram for malignant neoplasm of breast: Secondary | ICD-10-CM

## 2015-08-14 ENCOUNTER — Encounter: Payer: Self-pay | Admitting: Family Medicine

## 2015-08-14 ENCOUNTER — Ambulatory Visit (INDEPENDENT_AMBULATORY_CARE_PROVIDER_SITE_OTHER): Payer: 59 | Admitting: Family Medicine

## 2015-08-14 ENCOUNTER — Ambulatory Visit (HOSPITAL_COMMUNITY)
Admission: RE | Admit: 2015-08-14 | Discharge: 2015-08-14 | Disposition: A | Discharge status: Home / Self Care | Payer: 59 | Source: Ambulatory Visit | Attending: Family Medicine | Admitting: Family Medicine

## 2015-08-14 VITALS — BP 118/80 | Ht 62.0 in | Wt 140.0 lb

## 2015-08-14 DIAGNOSIS — M5010 Cervical disc disorder with radiculopathy, unspecified cervical region: Secondary | ICD-10-CM

## 2015-08-14 MED ORDER — PREDNISONE 20 MG PO TABS: ORAL_TABLET | ORAL | Status: DC

## 2015-08-14 NOTE — Progress Notes (Signed)
   Subjective:    Patient ID: Stephanie Bishop, female    DOB: 01/29/1966, 49 y.o.   MRN: [id number]  HPIneck pain. Neck surgery 15 years ago. Pain started several months ago. Getting worse the past 2 weeks.  Treatments tried advil, motrin, heat, and ice.  Ice helps better Dr. Rita Ohara  Patient states when the pain is severe as a 10 out of 10. She is noticed over the past 6 months progression on neck pain radiating into the right trapezius in the right arm worse over the past couple weeks. She denies high fever chills. Denies any chest congestion or cough Review of Systems    see above Objective:   Physical Exam on physical exam she has tenderness on the right side of the neck. She has good strength in both arms. Reflexes are good. Subjective pain discomfort damage up easiest back of the arm into the forearm.      Assessment & Plan:  Severe right side neck pain with previous surgery recommend x-rays recommend prednisone taper patient defers on pain medicine anti-inflammatory's may be used after the prednisone if not doing better over the next couple weeks then will need MRI possible referral back to neurosurgery. Patient aware that if she has severe pain weakness to follow-up immediately.

## 2015-09-11 ENCOUNTER — Ambulatory Visit
Admission: RE | Admit: 2015-09-11 | Discharge: 2015-09-11 | Disposition: A | Discharge status: Home / Self Care | Payer: 59 | Source: Ambulatory Visit

## 2015-09-11 DIAGNOSIS — Z1231 Encounter for screening mammogram for malignant neoplasm of breast: Secondary | ICD-10-CM

## 2015-09-12 ENCOUNTER — Encounter: Payer: Self-pay | Admitting: Family Medicine

## 2015-09-12 ENCOUNTER — Ambulatory Visit (INDEPENDENT_AMBULATORY_CARE_PROVIDER_SITE_OTHER): Payer: 59 | Admitting: Family Medicine

## 2015-09-12 VITALS — BP 118/80 | Ht 62.0 in | Wt 148.4 lb

## 2015-09-12 DIAGNOSIS — R635 Abnormal weight gain: Secondary | ICD-10-CM

## 2015-09-12 DIAGNOSIS — J0100 Acute maxillary sinusitis, unspecified: Secondary | ICD-10-CM

## 2015-09-12 MED ORDER — CEFDINIR 300 MG PO CAPS: 300.0000 mg | ORAL_CAPSULE | Freq: Two times a day (BID) | ORAL | Status: DC

## 2015-09-12 NOTE — Progress Notes (Signed)
   Subjective:    Patient ID: Stephanie Bishop, female    DOB: June 17, 1966, 49 y.o.   MRN: [id number]  Cough This is a chronic problem. The current episode started in the past 7 days. Associated symptoms include ear pain, a fever, headaches, nasal congestion and a sore throat. Treatments tried: zyrtec, tussin dm, sudafed.   Felt bad Friday, got gradually worse  Felt a ittle better, but now prod of phlegm Multi meds, but not helpoing  [place]  First thig in the morn h a the worse  Review of Systems  Constitutional: Positive for fever.  HENT: Positive for ear pain and sore throat.   Respiratory: Positive for cough.   Neurological: Positive for headaches.       Objective:   Physical Exam Alert mild malaise lungs clear heart rare rhythm H&T moderate nasal congestion abdomen soft       Assessment & Plan:  Impression rhinosinusitis/bronchitis plan antibiotics prescribed. Symptom care discussed anticipatory guidance given. On way out door patient said she would like to have thyroid checked because of weight gain past couple months WSL

## 2015-09-13 LAB — TSH: TSH: 2.88 u[IU]/mL (ref 0.450–4.500)

## 2015-09-14 ENCOUNTER — Telehealth: Payer: Self-pay | Admitting: Family Medicine

## 2015-09-14 ENCOUNTER — Encounter: Payer: Self-pay | Admitting: Family Medicine

## 2015-09-14 DIAGNOSIS — R1900 Intra-abdominal and pelvic swelling, mass and lump, unspecified site: Secondary | ICD-10-CM

## 2015-09-14 NOTE — Telephone Encounter (Signed)
Please give referral for this patient for these reasons. Thank you

## 2015-09-14 NOTE — Telephone Encounter (Signed)
Pt is needing a referral sent to Dr. Olevia Perches office by Monday for abd. Swelling and hernia issues. Pt has an appt there on Tues at 3:30 pm

## 2015-09-15 NOTE — Telephone Encounter (Signed)
Referral in system. Patient was notified.  

## 2015-09-19 ENCOUNTER — Encounter (INDEPENDENT_AMBULATORY_CARE_PROVIDER_SITE_OTHER): Payer: Self-pay | Admitting: Internal Medicine

## 2015-09-19 ENCOUNTER — Ambulatory Visit (INDEPENDENT_AMBULATORY_CARE_PROVIDER_SITE_OTHER): Payer: 59 | Admitting: Internal Medicine

## 2015-09-19 ENCOUNTER — Encounter (INDEPENDENT_AMBULATORY_CARE_PROVIDER_SITE_OTHER): Payer: Self-pay | Admitting: *Deleted

## 2015-09-19 VITALS — BP 144/86 | HR 96 | Temp 98.3°F | Ht 63.0 in | Wt 143.8 lb

## 2015-09-19 DIAGNOSIS — M542 Cervicalgia: Secondary | ICD-10-CM | POA: Insufficient documentation

## 2015-09-19 DIAGNOSIS — M4310 Spondylolisthesis, site unspecified: Secondary | ICD-10-CM | POA: Insufficient documentation

## 2015-09-19 DIAGNOSIS — K429 Umbilical hernia without obstruction or gangrene: Secondary | ICD-10-CM | POA: Diagnosis not present

## 2015-09-19 NOTE — Patient Instructions (Signed)
US abdomen 

## 2015-09-19 NOTE — Progress Notes (Signed)
   Subjective:    Patient ID: Stephanie Bishop, female    DOB: 22-Jul-1966, 49 y.o.   MRN: [id number]  HPIHere today for f/u with c/o abdominal distention and weight gain. She presents today with c/o 08/14/2015. She says she had a broken screw in her neck and was placed on Prednisone x 8 days and is finished now. The following week she had diarrhea and then the next week she had diarrhea x 3 days. She says her stomach began to swell.  She says she couldn't bend over to pick something up. This was the end of October. She says her BMs are not regular now since she had bouts of diarrhea.  She is now only having one BM a day and not as large.  Most of her stools are formed. No melena or BRRB.  She says her hernia feels bigger.  Her appetite is normal.  07/16/2012 Colonoscopy: Impression:  Examination performed to cecum.  Single diverticulum at sigmoid colon and external hemorrhoids otherwise normal examination.   04/20/2014 US abdomen;   FINDINGS: Real-time sonography of the anterior abdomen was performed. There is a supraumbilical fat containing hernia measuring approximately 3.4 cm. There is no peristalsing abnormality to suggest bowel.  IMPRESSION: Stable fact containing supraumbilical hernia.  06/15/2012 US abdomen: IMPRESSION:  Supraumbilical mass represents a midline hernia containing fat.    Review of Systems Past Medical History  Diagnosis Date  . GERD (gastroesophageal reflux disease)   . PONV (postoperative nausea and vomiting)     Past Surgical History  Procedure Laterality Date  . Cervical spine surgery      2001  . Colonoscopy  07/16/2012    Procedure: COLONOSCOPY;  Surgeon: Rogene Houston, MD;  Location: [facility];  Service: Endoscopy;  Laterality: N/A;  930    Allergies  Allergen Reactions  . Ciprofloxacin Other (See Comments)    Knots on Face     Current Outpatient Prescriptions on File Prior to Visit  Medication Sig Dispense Refill  . cefdinir  (OMNICEF) 300 MG capsule Take 1 capsule (300 mg total) by mouth 2 (two) times daily. 20 capsule 0  . cetirizine (ZYRTEC) 10 MG tablet Take 10 mg by mouth daily as needed.     Earney Navy Bicarbonate (ZEGERID) 20-1100 MG CAPS Take 1 capsule by mouth as needed.      No current facility-administered medications on file prior to visit.        Objective:   Physical Exam Blood pressure 144/86, pulse 96, temperature 98.3 F (36.8 C), height 5\' 3"  (1.6 m), weight 143 lb 12.8 oz (65.227 kg).  Alert and oriented. Skin warm and dry. Oral mucosa is moist.   . Sclera anicteric, conjunctivae is pink. Thyroid not enlarged. No cervical lymphadenopathy. Lungs clear. Heart regular rate and rhythm.  Abdomen is soft. Bowel sounds are positive. No hepatomegaly. No abdominal masses felt. Slight tenderness just above umblicus.  No edema to lower extremities.   When standing, I can feel her hernia.        Assessment & Plan:  Supraumbilical hernia. Will get US abdomen today to be sure there is no change.

## 2015-09-22 ENCOUNTER — Ambulatory Visit (HOSPITAL_COMMUNITY)
Admission: RE | Admit: 2015-09-22 | Discharge: 2015-09-22 | Disposition: A | Discharge status: Home / Self Care | Payer: 59 | Source: Ambulatory Visit | Attending: Internal Medicine | Admitting: Internal Medicine

## 2015-09-22 DIAGNOSIS — K429 Umbilical hernia without obstruction or gangrene: Secondary | ICD-10-CM | POA: Diagnosis not present

## 2015-09-25 ENCOUNTER — Telehealth: Payer: Self-pay | Admitting: Family Medicine

## 2015-09-25 NOTE — Telephone Encounter (Signed)
Thyroid is normal

## 2015-09-25 NOTE — Telephone Encounter (Signed)
Pt would like to know the results of her TSH

## 2015-09-25 NOTE — Telephone Encounter (Signed)
Notified patient that thyroid is normal. Patient verbalized understanding.

## 2015-10-03 ENCOUNTER — Encounter: Payer: Self-pay | Admitting: Nurse Practitioner

## 2015-10-03 ENCOUNTER — Ambulatory Visit (INDEPENDENT_AMBULATORY_CARE_PROVIDER_SITE_OTHER): Payer: 59 | Admitting: Nurse Practitioner

## 2015-10-03 VITALS — BP 120/86 | Temp 98.3°F | Ht 62.0 in | Wt 145.4 lb

## 2015-10-03 DIAGNOSIS — J329 Chronic sinusitis, unspecified: Secondary | ICD-10-CM

## 2015-10-03 DIAGNOSIS — H66001 Acute suppurative otitis media without spontaneous rupture of ear drum, right ear: Secondary | ICD-10-CM

## 2015-10-03 MED ORDER — AMOXICILLIN-POT CLAVULANATE 875-125 MG PO TABS: 1.0000 | ORAL_TABLET | Freq: Two times a day (BID) | ORAL | Status: DC

## 2015-10-03 NOTE — Patient Instructions (Signed)
Nasacort AQ as directed 

## 2015-10-06 ENCOUNTER — Encounter: Payer: Self-pay | Admitting: Nurse Practitioner

## 2015-10-06 NOTE — Progress Notes (Signed)
Subjective:  Presents for recheck on her sinus symptoms. Last seen 11/8. Continue to have some head congestion. Worse over the past 3 days. No fever. Sore throat. Maxillary area sinus headache. Yellow-green nasal drainage. Began having cough this morning. Bilateral ear pain. No wheezing. Minimal relief with OTC meds.   Objective:   BP 120/86 mmHg  Temp(Src) 98.3 F (36.8 C) (Oral)  Ht 5\' 2"  (1.575 m)  Wt 145 lb 6 oz (65.942 kg)  BMI 26.58 kg/m2 NAD. Alert, oriented. Left TM clear effusion, no erythema. Right TM effusion with moderate erythema. Pharynx injected with PND noted. Neck supple with mild soft anterior adenopathy. Lungs clear. Heart regular rate rhythm.  Assessment: Rhinosinusitis  Acute suppurative otitis media of right ear without spontaneous rupture of tympanic membrane, recurrence not specified  Plan:  Meds ordered this encounter  Medications  . amoxicillin-clavulanate (AUGMENTIN) 875-125 MG tablet    Sig: Take 1 tablet by mouth 2 (two) times daily.    Dispense:  20 tablet    Refill:  0    Order Specific Question:  Supervising Provider    Answer:  Mikey Kirschner [[id number]]   Continue OTC meds as directed. Add Nasacort AQ as directed. Call back in 7-10 days if no improvement, sooner if worse.

## 2016-02-08 ENCOUNTER — Encounter: Payer: Self-pay | Admitting: Nurse Practitioner

## 2016-02-08 ENCOUNTER — Ambulatory Visit (INDEPENDENT_AMBULATORY_CARE_PROVIDER_SITE_OTHER): Payer: BLUE CROSS/BLUE SHIELD | Admitting: Nurse Practitioner

## 2016-02-08 VITALS — BP 112/72 | Temp 98.3°F | Ht 63.0 in | Wt 142.0 lb

## 2016-02-08 DIAGNOSIS — R42 Dizziness and giddiness: Secondary | ICD-10-CM | POA: Diagnosis not present

## 2016-02-08 DIAGNOSIS — J300 Vasomotor rhinitis: Secondary | ICD-10-CM

## 2016-02-08 MED ORDER — PREDNISONE 20 MG PO TABS: ORAL_TABLET | ORAL | Status: DC

## 2016-02-08 MED ORDER — FLUTICASONE PROPIONATE 50 MCG/ACT NA SUSP: 2.0000 | Freq: Every day | NASAL | Status: DC

## 2016-02-08 NOTE — Patient Instructions (Signed)
Meclizine 25 mg every 6 hours as needed for dizziness. 

## 2016-02-09 ENCOUNTER — Encounter: Payer: Self-pay | Admitting: Nurse Practitioner

## 2016-02-09 NOTE — Progress Notes (Signed)
Subjective:  Presents for c/o dizziness occuring rarely for the past few months, worse over past 7 days. No fever. Slight headache. PND. Cough mainly in am. No sore throat or ear pain. Slight nausea. No vomiting. Worse with lying down and turning her head. Also with bending head. Minimal problem with position change. No visual changes or syncope. No numbness or weakness of face, arms or legs. Can still function and drive.   Objective:   BP 112/72 mmHg  Temp(Src) 98.3 F (36.8 C) (Oral)  Ht 5\' 3"  (1.6 m)  Wt 142 lb (64.411 kg)  BMI 25.16 kg/m2 NAD. Alert, oriented. TMs clear effusion, greater on the left. Pharynx non erythematous with cloudy PND noted. Neck supple with mild anterior adenopathy. Lungs clear. Heart RRR. Pupils equal and reactive to light. EOMs intact without nystagmus. Point to point localization normal. No nystagmus noted with head rotation in supine position although patient moved slowly due to dizziness. Gait normal. Patellar reflexes normal. Romberg neg.   Assessment: Vasomotor rhinitis  Vertigo  Plan:  Meds ordered this encounter  Medications  . fluticasone (FLONASE) 50 MCG/ACT nasal spray    Sig: Place 2 sprays into both nostrils daily.    Dispense:  16 g    Refill:  6    Order Specific Question:  Supervising Provider    Answer:  Mikey Kirschner [[id number]]  . predniSONE (DELTASONE) 20 MG tablet    Sig: 2 po qd x 5 d    Dispense:  10 tablet    Refill:  0    Order Specific Question:  Supervising Provider    Answer:  Mikey Kirschner [id number]   Warning signs reviewed. Call back in 7-10 days if no better, sooner if worse. Consider ENT referral if persists. OTC meclizine as directed.

## 2016-07-31 ENCOUNTER — Ambulatory Visit (INDEPENDENT_AMBULATORY_CARE_PROVIDER_SITE_OTHER): Payer: BLUE CROSS/BLUE SHIELD | Admitting: Family Medicine

## 2016-07-31 ENCOUNTER — Encounter: Payer: Self-pay | Admitting: Family Medicine

## 2016-07-31 VITALS — BP 124/82 | Ht 63.0 in | Wt 145.0 lb

## 2016-07-31 DIAGNOSIS — G729 Myopathy, unspecified: Secondary | ICD-10-CM | POA: Insufficient documentation

## 2016-07-31 NOTE — Progress Notes (Signed)
   Subjective:    Patient ID: Stephanie Bishop, female    DOB: [birth year], 50 y.o.   MRN: [id number]  Shoulder Pain   The pain is present in the left shoulder. This is a recurrent problem. The current episode started more than 1 month ago. There has been a history of trauma.  This patient relates that she's had some discomfort. She was lifting some items from a shed and she slipped and fell she landed on her shoulder felt fairly good afterwards but then later on started experiencing pain on the left side of her neck that goes into the trapezius does not go down the arm no numbness or tingling when she extends her head upward causes the pain as well as when she twists her head to the left. She denies weakness. She does have a history of previous cervical surgery. Patient states no other concerns this visit.    Review of Systems Relates trapezius pain denies arm pain no chest tightness pressure pain or shortness of breath    Objective:   Physical Exam Subjective discomfort left side of neck in the trapezius region increased pain with extending her head upward difficulty rotating left and right due to previous surgery reflexes good lungs clear heart regular strength good       Assessment & Plan:  Cervical nerve root impingement made worse with certain movements long discussion held with the patient she would prefer not to go through surgery if possible she will try anti-inflammatory over the next few weeks if that does not help enough consider gabapentin if ongoing troubles consider referral back to her surgeon for possible MRI and nerve root injections. If patient starts having unilateral arm pain or numbness or weakness into the arm she needs to notify us she will give [place] an update in several weeks

## 2016-08-06 DIAGNOSIS — H5213 Myopia, bilateral: Secondary | ICD-10-CM | POA: Diagnosis not present

## 2016-08-09 ENCOUNTER — Other Ambulatory Visit: Payer: Self-pay | Admitting: Obstetrics and Gynecology

## 2016-08-09 DIAGNOSIS — Z1231 Encounter for screening mammogram for malignant neoplasm of breast: Secondary | ICD-10-CM

## 2016-09-11 ENCOUNTER — Ambulatory Visit
Admission: RE | Admit: 2016-09-11 | Discharge: 2016-09-11 | Disposition: A | Discharge status: Home / Self Care | Payer: BLUE CROSS/BLUE SHIELD | Source: Ambulatory Visit | Attending: Obstetrics and Gynecology | Admitting: Obstetrics and Gynecology

## 2016-09-11 DIAGNOSIS — Z1231 Encounter for screening mammogram for malignant neoplasm of breast: Secondary | ICD-10-CM | POA: Diagnosis not present

## 2016-09-16 DIAGNOSIS — Z13820 Encounter for screening for osteoporosis: Secondary | ICD-10-CM | POA: Diagnosis not present

## 2016-09-16 DIAGNOSIS — Z01419 Encounter for gynecological examination (general) (routine) without abnormal findings: Secondary | ICD-10-CM | POA: Diagnosis not present

## 2016-09-16 DIAGNOSIS — Z6825 Body mass index (BMI) 25.0-25.9, adult: Secondary | ICD-10-CM | POA: Diagnosis not present

## 2016-11-08 DIAGNOSIS — M5030 Other cervical disc degeneration, unspecified cervical region: Secondary | ICD-10-CM | POA: Diagnosis not present

## 2016-11-08 DIAGNOSIS — M4722 Other spondylosis with radiculopathy, cervical region: Secondary | ICD-10-CM | POA: Diagnosis not present

## 2016-11-08 DIAGNOSIS — M4802 Spinal stenosis, cervical region: Secondary | ICD-10-CM | POA: Diagnosis not present

## 2016-11-08 DIAGNOSIS — R42 Dizziness and giddiness: Secondary | ICD-10-CM | POA: Diagnosis not present

## 2016-11-08 DIAGNOSIS — M5412 Radiculopathy, cervical region: Secondary | ICD-10-CM | POA: Insufficient documentation

## 2016-11-08 DIAGNOSIS — M4312 Spondylolisthesis, cervical region: Secondary | ICD-10-CM | POA: Diagnosis not present

## 2016-11-11 DIAGNOSIS — M5030 Other cervical disc degeneration, unspecified cervical region: Secondary | ICD-10-CM | POA: Diagnosis not present

## 2016-11-11 DIAGNOSIS — M4312 Spondylolisthesis, cervical region: Secondary | ICD-10-CM | POA: Diagnosis not present

## 2016-11-11 DIAGNOSIS — M5020 Other cervical disc displacement, unspecified cervical region: Secondary | ICD-10-CM | POA: Diagnosis not present

## 2016-11-11 DIAGNOSIS — M4722 Other spondylosis with radiculopathy, cervical region: Secondary | ICD-10-CM | POA: Diagnosis not present

## 2016-11-18 ENCOUNTER — Ambulatory Visit (INDEPENDENT_AMBULATORY_CARE_PROVIDER_SITE_OTHER): Payer: BLUE CROSS/BLUE SHIELD | Admitting: Family Medicine

## 2016-11-18 ENCOUNTER — Encounter: Payer: Self-pay | Admitting: Family Medicine

## 2016-11-18 VITALS — BP 122/86 | Temp 98.2°F | Ht 62.0 in | Wt 145.0 lb

## 2016-11-18 DIAGNOSIS — J329 Chronic sinusitis, unspecified: Secondary | ICD-10-CM | POA: Diagnosis not present

## 2016-11-18 MED ORDER — AMOXICILLIN 500 MG PO CAPS: 500.0000 mg | ORAL_CAPSULE | Freq: Three times a day (TID) | ORAL | 0 refills | Status: DC

## 2016-11-18 NOTE — Progress Notes (Signed)
   Subjective:    Patient ID: Stephanie Bishop, female    DOB: [birth year], 51 y.o.   MRN: [id number]  Sinusitis  This is a new problem. Episode onset: 10 days. Associated symptoms include congestion, coughing, ear pain, headaches and a sore throat. Treatments tried: allergy meds, tussin dm.    Started with dranage and cough  Now both ears painful  irrit throat worse in the bmorn   cough  Pos gunky prodtn both nasal anc cough    Allergy meds prn  No subs fever, dim erngy   Review of Systems  HENT: Positive for congestion, ear pain and sore throat.   Respiratory: Positive for cough.   Neurological: Positive for headaches.       Objective:   Physical Exam Alert, mild malaise. Hydration good Vitals stable. frontal/ maxillary tenderness evident positive nasal congestion. pharynx normal neck supple  lungs clear/no crackles or wheezes. heart regular in rhythm        Assessment & Plan:  Impression rhinosinusitis likely post viral, discussed with patient. plan antibiotics prescribed. Questions answered. Symptomatic care discussed. warning signs discussed. WSL

## 2016-12-04 ENCOUNTER — Ambulatory Visit (INDEPENDENT_AMBULATORY_CARE_PROVIDER_SITE_OTHER): Payer: BLUE CROSS/BLUE SHIELD | Admitting: Family Medicine

## 2016-12-04 VITALS — BP 118/82 | Temp 98.5°F | Wt 144.0 lb

## 2016-12-04 DIAGNOSIS — B349 Viral infection, unspecified: Secondary | ICD-10-CM | POA: Diagnosis not present

## 2016-12-04 DIAGNOSIS — J019 Acute sinusitis, unspecified: Secondary | ICD-10-CM

## 2016-12-04 MED ORDER — CLARITHROMYCIN 500 MG PO TABS: 500.0000 mg | ORAL_TABLET | Freq: Two times a day (BID) | ORAL | 0 refills | Status: DC

## 2016-12-04 NOTE — Progress Notes (Signed)
   Subjective:    Patient ID: BONNIELOU STEINGRABER, female    DOB: May 07, 1966, 51 y.o.   MRN: [id number]  HPI  Patient c/o productive cough-clear white mucus, bilateral ear pain, increased SOB on exertion.  She denies fever. Pt states symptoms started 11/18/16, saw Dr. Richardson Landry.  Viral like illness Secondary sinus Treated with antibiotics Now having bronchial chest component denies high fever chills vomiting sweats  Review of Systems Please see above    Objective:   Physical Exam Eardrums normal throat normal neck supple lungs clear heart regular       Assessment & Plan:  Viral syndrome Secondary rhinosinusitis Bronchial component which may take a couple weeks to go away Second round of antibiotics prescribed Warning signs discussed follow-up if

## 2017-08-07 ENCOUNTER — Other Ambulatory Visit: Payer: Self-pay | Admitting: Obstetrics and Gynecology

## 2017-08-07 DIAGNOSIS — Z1239 Encounter for other screening for malignant neoplasm of breast: Secondary | ICD-10-CM

## 2017-08-14 DIAGNOSIS — H5213 Myopia, bilateral: Secondary | ICD-10-CM | POA: Diagnosis not present

## 2017-08-28 ENCOUNTER — Other Ambulatory Visit: Payer: Self-pay | Admitting: Nurse Practitioner

## 2017-09-01 ENCOUNTER — Encounter: Payer: Self-pay | Admitting: Family Medicine

## 2017-09-01 ENCOUNTER — Ambulatory Visit (INDEPENDENT_AMBULATORY_CARE_PROVIDER_SITE_OTHER): Payer: BLUE CROSS/BLUE SHIELD | Admitting: Family Medicine

## 2017-09-01 VITALS — Temp 98.5°F | Ht 62.0 in | Wt 148.2 lb

## 2017-09-01 DIAGNOSIS — J019 Acute sinusitis, unspecified: Secondary | ICD-10-CM

## 2017-09-01 DIAGNOSIS — M7711 Lateral epicondylitis, right elbow: Secondary | ICD-10-CM

## 2017-09-01 MED ORDER — AMOXICILLIN-POT CLAVULANATE 875-125 MG PO TABS: 1.0000 | ORAL_TABLET | Freq: Two times a day (BID) | ORAL | 0 refills | Status: DC

## 2017-09-01 NOTE — Progress Notes (Signed)
   Subjective:    Patient ID: Stephanie Bishop, female    DOB: Feb 15, 1966, 51 y.o.   MRN: [id number]  Cough  This is a new problem. The current episode started 1 to 4 weeks ago. Associated symptoms include ear pain, a fever, headaches, nasal congestion, rhinorrhea and a sore throat. Pertinent negatives include no chest pain, shortness of breath or wheezing. She has tried OTC cough suppressant for the symptoms.  Viral-like illness for couple weeks with head congestion drainage coughing now with sinus pressure denies high fever chills wheezing or difficulty breathing  Also has left index finger MCP tenderness from slamming her hand on a table approximately 4 months ago some soreness and stiffness in the morning but then it goes away uses ibuprofen intermittently she wonders if she could have broke it  Also right elbow pain and discomfort after doing some heavy lifting in the summertime this is been present persistent.  She is not tried anything for it.    Review of Systems  Constitutional: Positive for fever. Negative for activity change.  HENT: Positive for congestion, ear pain, rhinorrhea and sore throat.   Eyes: Negative for discharge.  Respiratory: Positive for cough. Negative for shortness of breath and wheezing.   Cardiovascular: Negative for chest pain.  Neurological: Positive for headaches.       Objective:   Physical Exam  Constitutional: She appears well-developed.  HENT:  Head: Normocephalic.  Right Ear: External ear normal.  Left Ear: External ear normal.  Nose: Nose normal.  Mouth/Throat: Oropharynx is clear and moist. No oropharyngeal exudate.  Eyes: Right eye exhibits no discharge. Left eye exhibits no discharge.  Neck: Neck supple. No tracheal deviation present.  Cardiovascular: Normal rate and normal heart sounds.   No murmur heard. Pulmonary/Chest: Effort normal and breath sounds normal. She has no wheezes. She has no rales.  Lymphadenopathy:    She has no  cervical adenopathy.  Skin: Skin is warm and dry.  Nursing note and vitals reviewed.   Tenderness in the lateral epicondylitis region of the elbow no sign of any dislocation or fracture  Left MCP of the index finger slightly tender subjectively but does not appear to be abnormal.      Assessment & Plan:  Left MCP tenderness probably posttraumatic arthritis Aleve as needed if persistent or getting worse consider x-rays patient does not have any other signs stone with rheumatoid arthritis  Lateral epicondylitis right elbow ice compresses 20 minutes at a time several times per day wear tennis elbow brace and Aleve for the next 7-14 days follow-up if progressive troubles  Viral syndrome with secondary rhinosinusitis antibiotics prescribed warning signs discussed follow-up if problems

## 2017-09-12 ENCOUNTER — Ambulatory Visit
Admission: RE | Admit: 2017-09-12 | Discharge: 2017-09-12 | Disposition: A | Discharge status: Home / Self Care | Payer: BLUE CROSS/BLUE SHIELD | Source: Ambulatory Visit | Attending: Obstetrics and Gynecology | Admitting: Obstetrics and Gynecology

## 2017-09-12 DIAGNOSIS — Z1231 Encounter for screening mammogram for malignant neoplasm of breast: Secondary | ICD-10-CM | POA: Diagnosis not present

## 2017-09-12 DIAGNOSIS — Z1239 Encounter for other screening for malignant neoplasm of breast: Secondary | ICD-10-CM

## 2017-09-18 DIAGNOSIS — Z6825 Body mass index (BMI) 25.0-25.9, adult: Secondary | ICD-10-CM | POA: Diagnosis not present

## 2017-09-18 DIAGNOSIS — Z01419 Encounter for gynecological examination (general) (routine) without abnormal findings: Secondary | ICD-10-CM | POA: Diagnosis not present

## 2017-09-18 DIAGNOSIS — N841 Polyp of cervix uteri: Secondary | ICD-10-CM | POA: Diagnosis not present

## 2017-10-09 ENCOUNTER — Encounter: Payer: Self-pay | Admitting: Nurse Practitioner

## 2017-10-09 ENCOUNTER — Ambulatory Visit: Payer: BLUE CROSS/BLUE SHIELD | Admitting: Nurse Practitioner

## 2017-10-09 VITALS — BP 118/82 | Temp 98.2°F | Ht 62.0 in | Wt 150.8 lb

## 2017-10-09 DIAGNOSIS — B9689 Other specified bacterial agents as the cause of diseases classified elsewhere: Secondary | ICD-10-CM | POA: Diagnosis not present

## 2017-10-09 DIAGNOSIS — J019 Acute sinusitis, unspecified: Secondary | ICD-10-CM | POA: Diagnosis not present

## 2017-10-09 MED ORDER — CLARITHROMYCIN 500 MG PO TABS: 500.0000 mg | ORAL_TABLET | Freq: Two times a day (BID) | ORAL | 0 refills | Status: DC

## 2017-10-10 ENCOUNTER — Encounter: Payer: Self-pay | Admitting: Nurse Practitioner

## 2017-10-10 NOTE — Progress Notes (Signed)
Subjective: Presents for complaints of cough and congestion that began around Thanksgiving.  Had a similar issue back  in October, was prescribed Augmentin.  Symptoms improved but still had some coughing.  No fever or sore throat.  No wheezing.  Green yellow sputum at times.  Mainly coughing all day.  Minimal coughing at night as long as she lays very still.  Head congestion and runny nose.  Bilateral ear pain.  Pressure behind the eyes.  Non-smoker.  No alcohol use.  Has decreased her caffeine and citrus intake.  History of reflux which has been stable on Zegerid.  No abdominal pain.  Objective:   BP 118/82   Temp 98.2 F (36.8 C) (Oral)   Ht 5\' 2"  (1.575 m)   Wt 150 lb 12.8 oz (68.4 kg)   LMP 06/24/2013   BMI 27.58 kg/m  NAD.  Alert, oriented.  TMs clear effusion more on the right.  Pharynx posterior erythematous with green PND noted.  Neck supple with mild soft anterior adenopathy.  Lungs clear.  Occasional nonproductive cough noted.  Heart regular rate and rhythm.  Abdomen soft nontender.  Assessment:  Acute bacterial rhinosinusitis    Plan:   Meds ordered this encounter  Medications  . clarithromycin (BIAXIN) 500 MG tablet    Sig: Take 1 tablet (500 mg total) by mouth 2 (two) times daily.    Dispense:  20 tablet    Refill:  0    Order Specific Question:   Supervising Provider    Answer:   Mikey Kirschner [[id number]]   Continue Flonase and Zyrtec daily as directed.  Call back in 10-14 days if symptoms persist, sooner if worse.

## 2018-07-07 ENCOUNTER — Encounter (INDEPENDENT_AMBULATORY_CARE_PROVIDER_SITE_OTHER): Payer: Self-pay | Admitting: *Deleted

## 2018-07-07 ENCOUNTER — Encounter (INDEPENDENT_AMBULATORY_CARE_PROVIDER_SITE_OTHER): Payer: Self-pay | Admitting: Internal Medicine

## 2018-07-07 ENCOUNTER — Ambulatory Visit (INDEPENDENT_AMBULATORY_CARE_PROVIDER_SITE_OTHER): Payer: BLUE CROSS/BLUE SHIELD | Admitting: Internal Medicine

## 2018-07-07 VITALS — BP 118/90 | HR 78 | Temp 98.6°F | Resp 18 | Ht 63.0 in | Wt 151.9 lb

## 2018-07-07 DIAGNOSIS — R1010 Upper abdominal pain, unspecified: Secondary | ICD-10-CM | POA: Diagnosis not present

## 2018-07-07 NOTE — Patient Instructions (Signed)
Can take Tylenol up to 2 g/day in divided dose on as-needed basis.

## 2018-07-07 NOTE — Progress Notes (Signed)
Presenting complaint;  Upper abdominal pain.  History of present illness:  Patient is a 52 year old Caucasian female history of small ventral/supraumbilical hernia who was last seen in November 2016 and was doing well until April 15, 2018 when she experience sudden onset of pain across her upper abdomen while she was having helping her elderly mother.  Pain occurred around 2 AM.  She did not have any other symptoms such as nausea vomiting diaphoreses.  She took few times and went to sleep.  She woke up 2 hours later and now had just soreness.  The soreness gradually resolved a few days later. She had another episode of more or less similar pain a few days ago after she ate lasagna.  Once again she did not experience nausea vomiting diaphoreses chest pain or shortness of breath.  She has gone back to her PPI which she was taking 1 as-needed basis.  When she had this pain did she did not feel any lump or swelling in epigastric region where she has hernia.  She says her appetite is good but she is scared to eat pain might return.  But she has not lost any weight.  She denies hematuria dysuria melena or rectal bleeding.  She takes Aleve on as-needed basis which is not very often. Ultrasound in July 2007 and was negative for cholelithiasis. Stone in November 2016 was a limited study and did not address gallbladder.    Current Medications: Outpatient Encounter Medications as of 07/07/2018  Medication Sig  . calcium carbonate (TUMS - DOSED IN MG ELEMENTAL CALCIUM) 500 MG chewable tablet Chew 1 tablet by mouth as needed for indigestion or heartburn.  . cetirizine (ZYRTEC) 10 MG tablet Take 10 mg by mouth daily as needed.   . fluticasone (FLONASE) 50 MCG/ACT nasal spray USE TWO SPRAY(S) IN EACH NOSTRIL ONCE DAILY  . Omeprazole-Sodium Bicarbonate (ZEGERID) 20-1100 MG CAPS Take 1 capsule by mouth daily.   . [DISCONTINUED] clarithromycin (BIAXIN) 500 MG tablet Take 1 tablet (500 mg total) by mouth 2 (two) times  daily. (Patient not taking: Reported on 07/07/2018)   No facility-administered encounter medications on file as of 07/07/2018.    Past Medical History:  Diagnosis Date  . GERD (gastroesophageal reflux disease)   . PONV (postoperative nausea and vomiting)    Past Surgical History:  Procedure Laterality Date  . BREAST BIOPSY Left   . BREAST CYST ASPIRATION Bilateral   . CERVICAL SPINE SURGERY     2001  . COLONOSCOPY  07/16/2012   Procedure: COLONOSCOPY;  Surgeon: Rogene Houston, MD;  Location: [facility];  Service: Endoscopy;  Laterality: N/A;  930   Allergies:  Allergies  Allergen Reactions  . Ciprofloxacin Other (See Comments)    Knots on Face    Family history:  Mother is 46 years old and stays at home.  She and her brother take turns taking care of her. Father had pulmonary TB and died at age 41. She has 2 brothers with hypertension.  One brother died at age 51.  He had a hemorrhagic stroke at 65 and also had end-stage renal disease.  Social history:  She is married.  She has 3 children ages 44, 70 and 97. 57 years.  She is not working but taking care of her elderly mother. She has never smoked cigarettes and does not drink alcohol.   Objective: Blood pressure 118/90, pulse 78, temperature 98.6 F (37 C), temperature source Oral, resp. rate 18, height 5\' 3"  (1.6 m),  weight 151 lb 14.4 oz (68.9 kg), last menstrual period 06/24/2013. Patient is alert and in no acute distress. Conjunctiva is pink. Sclera is nonicteric Oropharyngeal mucosa is normal. No neck masses or thyromegaly noted. Cardiac exam with regular rhythm normal S1 and S2. No murmur or gallop noted. Lungs are clear to auscultation. Abdomen is symmetrical.  Bowel sounds are normal.  On palpation abdomen is soft and nontender with organomegaly or masses. No LE edema or clubbing noted.   Assessment:  Patient is 52 year old Caucasian female who presents with intermittent upper abdominal pain without any  associated symptoms.  Between the spells she has been asymptomatic.  She has chronic GERD and heartburn is well controlled with therapy.  She is on OTC NSAID on PRN basis but her symptoms are not typical of peptic ulcer disease.  She certainly could have cholelithiasis or could be having intermittent recurrence with pain.   Plan:  Patient advised to stop taking Aleve. She can take Tylenol up to 2 g/day on as-needed basis. Patient will call if she has another episode of severe pain in which case she may have to be seen on urgent basis. She will go to the lab for CBC and comprehensive chemistry panel. Upper abdominal ultrasound. Further recommendations to follow.

## 2018-07-10 ENCOUNTER — Ambulatory Visit (HOSPITAL_COMMUNITY)
Admission: RE | Admit: 2018-07-10 | Discharge: 2018-07-10 | Disposition: A | Discharge status: Home / Self Care | Payer: BLUE CROSS/BLUE SHIELD | Source: Ambulatory Visit | Attending: Internal Medicine | Admitting: Internal Medicine

## 2018-07-10 DIAGNOSIS — R1010 Upper abdominal pain, unspecified: Secondary | ICD-10-CM | POA: Insufficient documentation

## 2018-07-10 DIAGNOSIS — K7689 Other specified diseases of liver: Secondary | ICD-10-CM | POA: Diagnosis not present

## 2018-07-10 LAB — COMPREHENSIVE METABOLIC PANEL
AG Ratio: 1.6 (calc) (ref 1.0–2.5)
ALT: 19 U/L (ref 6–29)
AST: 20 U/L (ref 10–35)
Albumin: 4.4 g/dL (ref 3.6–5.1)
Alkaline phosphatase (APISO): 87 U/L (ref 33–130)
BUN: 13 mg/dL (ref 7–25)
CO2: 28 mmol/L (ref 20–32)
Calcium: 9.5 mg/dL (ref 8.6–10.4)
Chloride: 106 mmol/L (ref 98–110)
Creat: 0.92 mg/dL (ref 0.50–1.05)
Globulin: 2.7 g/dL (calc) (ref 1.9–3.7)
Glucose, Bld: 86 mg/dL (ref 65–99)
Potassium: 4.6 mmol/L (ref 3.5–5.3)
Sodium: 140 mmol/L (ref 135–146)
Total Bilirubin: 0.3 mg/dL (ref 0.2–1.2)
Total Protein: 7.1 g/dL (ref 6.1–8.1)

## 2018-07-10 LAB — CBC
HCT: 45.5 % — ABNORMAL HIGH (ref 35.0–45.0)
Hemoglobin: 14.7 g/dL (ref 11.7–15.5)
MCH: 27.5 pg (ref 27.0–33.0)
MCHC: 32.3 g/dL (ref 32.0–36.0)
MCV: 85 fL (ref 80.0–100.0)
MPV: 11.1 fL (ref 7.5–12.5)
Platelets: 272 10*3/uL (ref 140–400)
RBC: 5.35 10*6/uL — ABNORMAL HIGH (ref 3.80–5.10)
RDW: 13.8 % (ref 11.0–15.0)
WBC: 5.7 10*3/uL (ref 3.8–10.8)

## 2018-07-22 ENCOUNTER — Other Ambulatory Visit: Payer: Self-pay | Admitting: Obstetrics and Gynecology

## 2018-07-22 DIAGNOSIS — Z1231 Encounter for screening mammogram for malignant neoplasm of breast: Secondary | ICD-10-CM

## 2018-07-23 ENCOUNTER — Encounter: Payer: Self-pay | Admitting: Family Medicine

## 2018-07-23 ENCOUNTER — Ambulatory Visit: Payer: BLUE CROSS/BLUE SHIELD | Admitting: Family Medicine

## 2018-07-23 VITALS — BP 112/78 | Wt 154.1 lb

## 2018-07-23 DIAGNOSIS — M7741 Metatarsalgia, right foot: Secondary | ICD-10-CM | POA: Diagnosis not present

## 2018-07-23 DIAGNOSIS — M19079 Primary osteoarthritis, unspecified ankle and foot: Secondary | ICD-10-CM | POA: Diagnosis not present

## 2018-07-23 NOTE — Progress Notes (Signed)
   Subjective:    Patient ID: Stephanie Bishop, female    DOB: 1966/03/12, 52 y.o.   MRN: [id number]  HPI  Patient is here today with bilateral foot pain. She states the left foot pain on the top of the foot. Right foot hurts under the bottom and feels like she is walking on marbles. She states she had injured her self in January when she had fallen and states since then she has been having these problems. Patient relates some midfoot pain ever since having twisted foot back in January She also relates a lot of pain discomfort on the bottom of her feet more so on the right than the left She does try to use good walking shoes Denies any other injury Does not tolerate anti-inflammatories well Recently seen for gastritis and they advised her to be off of NSAIDs. Review of Systems Denies calf pain knee pain ankle pain please see above    Objective:   Physical Exam Calf normal ankle normal tenderness in the midportion of the right foot as well as the midportion of the left foot tenderness on the right side and the metatarsal joints No significant swelling noted.       Assessment & Plan:  Significant left foot pain discomfort probably related to a midfoot sprain hopefully will gradually get better patient also has arthritis in her left great MTP  She also has metatarsalgia in the bottom of the right foot she may try cool compresses frequently and if ongoing troubles notify us we will help set her up with podiatry.

## 2018-08-24 ENCOUNTER — Telehealth: Payer: Self-pay | Admitting: Family Medicine

## 2018-08-24 DIAGNOSIS — M79671 Pain in right foot: Secondary | ICD-10-CM

## 2018-08-24 DIAGNOSIS — M79672 Pain in left foot: Secondary | ICD-10-CM

## 2018-08-24 NOTE — Telephone Encounter (Signed)
Pt was seen on 07/23/18 and discussed moving forward with seeing Dr. Caprice Beaver for her feet. She would like to move forward and have that referral placed.

## 2018-08-24 NOTE — Telephone Encounter (Signed)
[place] referral was put in for dr. Yehuda Mao but its DR. Mckinney. Pt was notified our office would set up appt

## 2018-08-24 NOTE — Telephone Encounter (Signed)
Please go ahead and place referral to podiatry Dr. Yehuda Mao for patient bilateral foot pain

## 2018-09-01 ENCOUNTER — Encounter: Payer: Self-pay | Admitting: Family Medicine

## 2018-09-14 ENCOUNTER — Ambulatory Visit
Admission: RE | Admit: 2018-09-14 | Discharge: 2018-09-14 | Disposition: A | Discharge status: Home / Self Care | Payer: BLUE CROSS/BLUE SHIELD | Source: Ambulatory Visit | Attending: Obstetrics and Gynecology | Admitting: Obstetrics and Gynecology

## 2018-09-14 DIAGNOSIS — Z1231 Encounter for screening mammogram for malignant neoplasm of breast: Secondary | ICD-10-CM

## 2018-09-21 DIAGNOSIS — Z6826 Body mass index (BMI) 26.0-26.9, adult: Secondary | ICD-10-CM | POA: Diagnosis not present

## 2018-09-21 DIAGNOSIS — Z01419 Encounter for gynecological examination (general) (routine) without abnormal findings: Secondary | ICD-10-CM | POA: Diagnosis not present

## 2018-09-22 DIAGNOSIS — M19071 Primary osteoarthritis, right ankle and foot: Secondary | ICD-10-CM | POA: Diagnosis not present

## 2018-09-22 DIAGNOSIS — M79671 Pain in right foot: Secondary | ICD-10-CM | POA: Diagnosis not present

## 2018-09-22 DIAGNOSIS — M79672 Pain in left foot: Secondary | ICD-10-CM | POA: Diagnosis not present

## 2018-09-22 DIAGNOSIS — M205X2 Other deformities of toe(s) (acquired), left foot: Secondary | ICD-10-CM | POA: Diagnosis not present

## 2018-09-22 DIAGNOSIS — M205X1 Other deformities of toe(s) (acquired), right foot: Secondary | ICD-10-CM | POA: Diagnosis not present

## 2018-12-29 ENCOUNTER — Encounter: Payer: Self-pay | Admitting: Family Medicine

## 2018-12-30 NOTE — Telephone Encounter (Signed)
Nurses please communicate with the patient It is true that flulike illnesses can hurt the immune system It is quite possible that the patient has another viral illness If she gradually gets better over the next four or 5 days then no further antibiotics are necessary But if she does not see things getting better or if they are getting worse I recommend an office visit  I would recommend doing the lab work in approximately 10 days regardless  We are certainly happy to see her sooner if need be or if family would like to be seen  I also recommend the next time they are at the office to discuss with the front help to create a my chart application for the daughter specifically so all documentation regarding Shirlee Limerick can end up in her chart

## 2018-12-30 NOTE — Telephone Encounter (Signed)
Replied to mother via phone under patient chart.

## 2019-01-07 DIAGNOSIS — D1720 Benign lipomatous neoplasm of skin and subcutaneous tissue of unspecified limb: Secondary | ICD-10-CM | POA: Insufficient documentation

## 2019-01-07 DIAGNOSIS — N6009 Solitary cyst of unspecified breast: Secondary | ICD-10-CM | POA: Insufficient documentation

## 2019-01-07 DIAGNOSIS — N6332 Unspecified lump in axillary tail of the left breast: Secondary | ICD-10-CM | POA: Diagnosis not present

## 2019-01-07 DIAGNOSIS — N819 Female genital prolapse, unspecified: Secondary | ICD-10-CM | POA: Insufficient documentation

## 2019-01-08 ENCOUNTER — Other Ambulatory Visit: Payer: Self-pay | Admitting: Obstetrics and Gynecology

## 2019-01-08 DIAGNOSIS — N6320 Unspecified lump in the left breast, unspecified quadrant: Secondary | ICD-10-CM

## 2019-01-13 ENCOUNTER — Other Ambulatory Visit: Payer: BLUE CROSS/BLUE SHIELD

## 2019-01-13 ENCOUNTER — Ambulatory Visit
Admission: RE | Admit: 2019-01-13 | Discharge: 2019-01-13 | Disposition: A | Discharge status: Home / Self Care | Payer: BLUE CROSS/BLUE SHIELD | Source: Ambulatory Visit | Attending: Obstetrics and Gynecology | Admitting: Obstetrics and Gynecology

## 2019-01-13 ENCOUNTER — Other Ambulatory Visit: Payer: Self-pay

## 2019-01-13 ENCOUNTER — Other Ambulatory Visit: Payer: Self-pay | Admitting: Obstetrics and Gynecology

## 2019-01-13 DIAGNOSIS — N6320 Unspecified lump in the left breast, unspecified quadrant: Secondary | ICD-10-CM

## 2019-01-13 DIAGNOSIS — R928 Other abnormal and inconclusive findings on diagnostic imaging of breast: Secondary | ICD-10-CM | POA: Diagnosis not present

## 2019-01-19 ENCOUNTER — Ambulatory Visit (INDEPENDENT_AMBULATORY_CARE_PROVIDER_SITE_OTHER): Payer: BLUE CROSS/BLUE SHIELD | Admitting: Family Medicine

## 2019-01-19 ENCOUNTER — Other Ambulatory Visit: Payer: Self-pay

## 2019-01-19 ENCOUNTER — Encounter: Payer: Self-pay | Admitting: Family Medicine

## 2019-01-19 VITALS — HR 80 | Temp 98.4°F

## 2019-01-19 DIAGNOSIS — J329 Chronic sinusitis, unspecified: Secondary | ICD-10-CM | POA: Diagnosis not present

## 2019-01-19 MED ORDER — AMOXICILLIN 500 MG PO TABS: ORAL_TABLET | ORAL | 0 refills | Status: DC

## 2019-01-19 NOTE — Progress Notes (Signed)
   Subjective:    Patient ID: Stephanie Bishop, female    DOB: September 18, 1966, 53 y.o.   MRN: [id number]  Cough  This is a new problem. Episode onset: first week of february.  cough getting worse with tree pollen. Coughing up green mucus. Started out clear, then white now green. Has noticied fever. No wheezing or sob.  Tried zyrtec, sudafed, robitussin dm, nasal spray,.     Review of Systems  Respiratory: Positive for cough.        Objective:   Physical Exam  Alert active afebrile 98.5 O2 sat 95% pharynx good lungs clear heart rate and rhythm abdomen soft occasional bronchial cough     Assessment & Plan:  Impression subacute bronchitis likely triggered by allergies.  Antibiotics prescribed symptom care discussed Robitussin-DM.

## 2019-04-19 ENCOUNTER — Telehealth (INDEPENDENT_AMBULATORY_CARE_PROVIDER_SITE_OTHER): Payer: Self-pay | Admitting: Internal Medicine

## 2019-04-19 NOTE — Telephone Encounter (Signed)
Tried to call the patient. Phone rang multiple times. No answer nor did it go to voicemail.

## 2019-04-19 NOTE — Telephone Encounter (Signed)
Patient wants to know if Dr Laural Golden can call her in something for her acid reflux -  Patient did not want to see Karna Christmas  -  Patient ph# [phone]

## 2019-04-20 ENCOUNTER — Emergency Department (HOSPITAL_COMMUNITY)
Admission: EM | Admit: 2019-04-20 | Discharge: 2019-04-20 | Disposition: A | Discharge status: Home / Self Care | Payer: BC Managed Care – PPO | Attending: Emergency Medicine | Admitting: Emergency Medicine

## 2019-04-20 ENCOUNTER — Other Ambulatory Visit: Payer: Self-pay

## 2019-04-20 ENCOUNTER — Encounter (HOSPITAL_COMMUNITY): Payer: Self-pay | Admitting: *Deleted

## 2019-04-20 ENCOUNTER — Ambulatory Visit (INDEPENDENT_AMBULATORY_CARE_PROVIDER_SITE_OTHER): Payer: BC Managed Care – PPO | Admitting: Family Medicine

## 2019-04-20 DIAGNOSIS — Z79899 Other long term (current) drug therapy: Secondary | ICD-10-CM | POA: Diagnosis not present

## 2019-04-20 DIAGNOSIS — R112 Nausea with vomiting, unspecified: Secondary | ICD-10-CM

## 2019-04-20 LAB — CBC WITH DIFFERENTIAL/PLATELET
Abs Immature Granulocytes: 0.02 10*3/uL (ref 0.00–0.07)
Basophils Absolute: 0 10*3/uL (ref 0.0–0.1)
Basophils Relative: 1 %
Eosinophils Absolute: 0 10*3/uL (ref 0.0–0.5)
Eosinophils Relative: 0 %
HCT: 44.3 % (ref 36.0–46.0)
Hemoglobin: 14.3 g/dL (ref 12.0–15.0)
Immature Granulocytes: 0 %
Lymphocytes Relative: 15 %
Lymphs Abs: 1.1 10*3/uL (ref 0.7–4.0)
MCH: 27.6 pg (ref 26.0–34.0)
MCHC: 32.3 g/dL (ref 30.0–36.0)
MCV: 85.4 fL (ref 80.0–100.0)
Monocytes Absolute: 0.4 10*3/uL (ref 0.1–1.0)
Monocytes Relative: 6 %
Neutro Abs: 5.9 10*3/uL (ref 1.7–7.7)
Neutrophils Relative %: 78 %
Platelets: 261 10*3/uL (ref 150–400)
RBC: 5.19 MIL/uL — ABNORMAL HIGH (ref 3.87–5.11)
RDW: 14.4 % (ref 11.5–15.5)
WBC: 7.5 10*3/uL (ref 4.0–10.5)
nRBC: 0 % (ref 0.0–0.2)

## 2019-04-20 LAB — URINALYSIS, ROUTINE W REFLEX MICROSCOPIC
Bilirubin Urine: NEGATIVE
Glucose, UA: NEGATIVE mg/dL
Hgb urine dipstick: NEGATIVE
Ketones, ur: 5 mg/dL — AB
Leukocytes,Ua: NEGATIVE
Nitrite: NEGATIVE
Protein, ur: NEGATIVE mg/dL
Specific Gravity, Urine: 1.004 — ABNORMAL LOW (ref 1.005–1.030)
pH: 7 (ref 5.0–8.0)

## 2019-04-20 LAB — COMPREHENSIVE METABOLIC PANEL
ALT: 17 U/L (ref 0–44)
AST: 20 U/L (ref 15–41)
Albumin: 4 g/dL (ref 3.5–5.0)
Alkaline Phosphatase: 86 U/L (ref 38–126)
Anion gap: 10 (ref 5–15)
BUN: 12 mg/dL (ref 6–20)
CO2: 25 mmol/L (ref 22–32)
Calcium: 9.2 mg/dL (ref 8.9–10.3)
Chloride: 105 mmol/L (ref 98–111)
Creatinine, Ser: 0.79 mg/dL (ref 0.44–1.00)
GFR calc Af Amer: >60 mL/min (ref >60)
GFR calc non Af Amer: >60 mL/min (ref >60)
Glucose, Bld: 100 mg/dL — ABNORMAL HIGH (ref 70–99)
Potassium: 3.7 mmol/L (ref 3.5–5.1)
Sodium: 140 mmol/L (ref 135–145)
Total Bilirubin: 0.6 mg/dL (ref 0.3–1.2)
Total Protein: 7.4 g/dL (ref 6.5–8.1)

## 2019-04-20 LAB — LIPASE, BLOOD: Lipase: 27 U/L (ref 11–51)

## 2019-04-20 MED ORDER — LIDOCAINE VISCOUS HCL 2 % MT SOLN
15.0000 mL | Freq: Once | OROMUCOSAL | Status: AC
  Administered 2019-04-20: 15 mL via OROMUCOSAL
  Filled 2019-04-20: qty 15

## 2019-04-20 MED ORDER — ONDANSETRON HCL 4 MG/2ML IJ SOLN
4.0000 mg | Freq: Once | INTRAMUSCULAR | Status: AC
  Administered 2019-04-20: 4 mg via INTRAVENOUS
  Filled 2019-04-20: qty 2

## 2019-04-20 MED ORDER — ALUM & MAG HYDROXIDE-SIMETH 200-200-20 MG/5ML PO SUSP
15.0000 mL | Freq: Once | ORAL | Status: AC
  Administered 2019-04-20: 15 mL via ORAL
  Filled 2019-04-20: qty 30

## 2019-04-20 MED ORDER — PROMETHAZINE HCL 25 MG/ML IJ SOLN
12.5000 mg | Freq: Once | INTRAMUSCULAR | Status: AC
  Administered 2019-04-20: 12.5 mg via INTRAVENOUS
  Filled 2019-04-20: qty 1

## 2019-04-20 MED ORDER — SODIUM CHLORIDE 0.9 % IV BOLUS
1000.0000 mL | Freq: Once | INTRAVENOUS | Status: AC
  Administered 2019-04-20: 1000 mL via INTRAVENOUS

## 2019-04-20 MED ORDER — ONDANSETRON 8 MG PO TBDP: ORAL_TABLET | ORAL | 1 refills | Status: DC

## 2019-04-20 MED ORDER — PROMETHAZINE HCL 25 MG RE SUPP: 25.0000 mg | Freq: Four times a day (QID) | RECTAL | 1 refills | Status: DC | PRN

## 2019-04-20 NOTE — Progress Notes (Signed)
   Subjective:    Patient ID: Stephanie Bishop, female    DOB: May 23, 1966, 53 y.o.   MRN: [id number]  Dizziness This is a new problem. Associated symptoms include nausea, neck pain and vomiting. The symptoms are aggravated by bending. Treatments tried: Tums.  Unable to keep anything down today. Pt was able to eat a bland diet yesterday and the weekend. Symptoms primarily of nausea vomiting not feeling good denies body aches sweats chills fevers Pt did try to get in touch with GI but GI is booked up until September. Pt has stopped caffeine, chocolate, ibuprofen and Tums.  Virtual Visit via Video Note  I connected with Stephanie Bishop on 04/20/19 at  3:50 PM EDT by a video enabled telemedicine application and verified that I am speaking with the correct person using two identifiers.  Location: Patient: home Provider: office   I discussed the limitations of evaluation and management by telemedicine and the availability of in person appointments. The patient expressed understanding and agreed to proceed.  History of Present Illness:    Observations/Objective:   Assessment and Plan:   Follow Up Instructions:    I discussed the assessment and treatment plan with the patient. The patient was provided an opportunity to ask questions and all were answered. The patient agreed with the plan and demonstrated an understanding of the instructions.   The patient was advised to call back or seek an in-person evaluation if the symptoms worsen or if the condition fails to improve as anticipated.  I provided 15 minutes of non-face-to-face time during this encounter.   Vicente Males, LPN    Review of Systems  Gastrointestinal: Positive for nausea and vomiting.  Musculoskeletal: Positive for neck pain.  Neurological: Positive for dizziness.  Denies abdominal pain She denies high fevers chills body aches    Objective:   Physical Exam   Patient had virtual visit Appears to be in no  distress Atraumatic Neuro able to relate and oriented No apparent resp distress Color normal Patient looks like she does not feel good     Assessment & Plan:  Significant nausea for several days Intractable vomiting Will try nausea medicine Patient denies abdominal pain but is having some reflux pain If patient does not see significant improvement with nausea medicine I highly recommend going to the ER for further evaluation treatment and nausea medicines

## 2019-04-20 NOTE — ED Provider Notes (Signed)
[facility] EMERGENCY DEPARTMENT Provider Note   CSN: [id number] Arrival date & time: 04/20/19  1728    History   Chief Complaint Chief Complaint  Patient presents with  . Emesis    HPI Stephanie Bishop is a 53 y.o. female past medical history of GERD, abdominal hernia, presenting to the emergency department with complaint of nausea and vomiting that began Saturday evening.  She states on Friday she had a meal she should have, she had "fat boys" and then had ice cream later that evening.  She states Saturday evening she began having nausea and vomiting with associated epigastric abdominal gnawing pain.  She states her emesis is nonbloody and nonbilious.  She has no associated diarrhea, constipation, fever, urinary symptoms.  She called her PCP who prescribed her ODT Zofran, however she states it has not been helping.  She states she has had intermittent issues with vertigo throughout the month, however this is different.  She states her nausea and vomiting with the vertigo was when she was having changes in position and had the dizziness which caused a nausea and vomiting.  Her nausea and vomiting is not associated with her dizziness since Saturday.  No history of abdominal surgeries.     The history is provided by the patient.    Past Medical History:  Diagnosis Date  . GERD (gastroesophageal reflux disease)   . PONV (postoperative nausea and vomiting)     Patient Active Problem List   Diagnosis Date Noted  . Arthritis of first MTP joint 07/23/2018  . Metatarsalgia of right foot 07/23/2018  . Cervical myopathy 07/31/2016  . Abdominal hernia 04/18/2014  . Guaiac positive stools 06/15/2012  . Abdominal wall mass 06/15/2012  . GERD 10/16/2010  . EPIGASTRIC PAIN 10/16/2010    Past Surgical History:  Procedure Laterality Date  . BREAST BIOPSY Left 2012   benign  . BREAST CYST ASPIRATION Bilateral   . CERVICAL SPINE SURGERY     2001  . COLONOSCOPY  07/16/2012   Procedure:  COLONOSCOPY;  Surgeon: Rogene Houston, MD;  Location: [facility];  Service: Endoscopy;  Laterality: N/A;  930     OB History   No obstetric history on file.      Home Medications    Prior to Admission medications   Medication Sig Start Date End Date Taking? Authorizing Provider  calcium carbonate (TUMS - DOSED IN MG ELEMENTAL CALCIUM) 500 MG chewable tablet Chew 1 tablet by mouth as needed for indigestion or heartburn.   Yes [provider]  cetirizine (ZYRTEC) 10 MG tablet Take 10 mg by mouth daily as needed for allergies.    Yes [provider]  Omeprazole-Sodium Bicarbonate (ZEGERID) 20-1100 MG CAPS Take 1 capsule by mouth daily.    Yes [provider]  ondansetron (ZOFRAN ODT) 8 MG disintegrating tablet Take one tablet TID prn Patient taking differently: Take 8 mg by mouth every 8 (eight) hours as needed for nausea or vomiting.  04/20/19  Yes Luking, Elayne Snare, MD  promethazine (PHENERGAN) 25 MG suppository Place 1 suppository (25 mg total) rectally every 6 (six) hours as needed for nausea or vomiting. 04/20/19  Yes Luking, Scott A, MD  pseudoephedrine (SUDAFED) 30 MG tablet Take 30 mg by mouth daily as needed for congestion.   Yes [provider]  fluticasone (FLONASE) 50 MCG/ACT nasal spray USE TWO SPRAY(S) IN EACH NOSTRIL ONCE DAILY Patient not taking: Reported on 04/20/2019 08/28/17   Nilda Simmer, NP  Family History Family History  Problem Relation Age of Onset  . Colon cancer Neg Hx     Social History Social History   Tobacco Use  . Smoking status: Never Smoker  . Smokeless tobacco: Never Used  Substance Use Topics  . Alcohol use: No  . Drug use: No     Allergies   Ciprofloxacin   Review of Systems Review of Systems  Constitutional: Negative for fever.  Gastrointestinal: Positive for abdominal pain, nausea and vomiting. Negative for constipation and diarrhea.  Genitourinary: Negative for dysuria and frequency.   All other systems reviewed and are negative.    Physical Exam Updated Vital Signs BP (!) 133/91   Pulse 97   Temp 97.9 F (36.6 C)   Resp 20   Ht 5\' 2"  (1.575 m)   Wt 68 kg   LMP 06/24/2013   SpO2 100%   BMI 27.44 kg/m   Physical Exam Vitals signs and nursing note reviewed.  Constitutional:      General: She is not in acute distress.    Appearance: She is well-developed. She is not ill-appearing.  HENT:     Head: Normocephalic and atraumatic.     Mouth/Throat:     Mouth: Mucous membranes are moist.  Eyes:     Conjunctiva/sclera: Conjunctivae normal.  Cardiovascular:     Rate and Rhythm: Normal rate and regular rhythm.  Pulmonary:     Effort: Pulmonary effort is normal. No respiratory distress.     Breath sounds: Normal breath sounds.  Abdominal:     General: Abdomen is flat. There is no distension.     Palpations: Abdomen is soft.     Tenderness: There is abdominal tenderness in the right upper quadrant and epigastric area. There is no guarding or rebound. Negative signs include Murphy's sign.  Skin:    General: Skin is warm.  Neurological:     Mental Status: She is alert.  Psychiatric:        Behavior: Behavior normal.      ED Treatments / Results  Labs (all labs ordered are listed, but only abnormal results are displayed) Labs Reviewed  COMPREHENSIVE METABOLIC PANEL - Abnormal; Notable for the following components:      Result Value   Glucose, Bld 100 (*)    All other components within normal limits  CBC WITH DIFFERENTIAL/PLATELET - Abnormal; Notable for the following components:   RBC 5.19 (*)    All other components within normal limits  URINALYSIS, ROUTINE W REFLEX MICROSCOPIC - Abnormal; Notable for the following components:   Color, Urine STRAW (*)    Specific Gravity, Urine 1.004 (*)    Ketones, ur 5 (*)    All other components within normal limits  LIPASE, BLOOD    EKG None  Radiology No results found.  Procedures Procedures  (including critical care time)  Medications Ordered in ED Medications  sodium chloride 0.9 % bolus 1,000 mL (0 mLs Intravenous Stopped 04/20/19 1948)  ondansetron (ZOFRAN) injection 4 mg (4 mg Intravenous Given 04/20/19 1817)  alum & mag hydroxide-simeth (MAALOX/MYLANTA) 200-200-20 MG/5ML suspension 15 mL (15 mLs Oral Given 04/20/19 1957)  lidocaine (XYLOCAINE) 2 % viscous mouth solution 15 mL (15 mLs Mouth/Throat Given 04/20/19 1957)  promethazine (PHENERGAN) injection 12.5 mg (12.5 mg Intravenous Given 04/20/19 1957)     Initial Impression / Assessment and Plan / ED Course  I have reviewed the triage vital signs and the nursing notes.  Pertinent labs & imaging results that were available  during my care of the patient were reviewed by me and considered in my medical decision making (see chart for details).  Clinical Course as of Apr 19 2046  Tue Apr 20, 2019  1940 Patient reevaluated, reports her nausea is still present, however no vomiting.  Will dose with Phenergan and provide GI cocktail.  P.o. challenge.   [JR]  2034 Patient still has some nausea, however no vomiting.  Tolerating p.o.  At this time will discharge with instruction to use the Phenergan suppositories she was prescribed by PCP, she reports she was not using these.  Clear liquid diet until symptom improvement.  Antacids.  Close PCP follow-up.  Return precautions.   [JR]    Clinical Course User Index [JR] , [place] N, PA-C      Patient presenting with 3 days of nausea, vomiting, and epigastric abdominal pain.  Hx of GERD with some worsening symptoms.  No diarrhea, constipation, fevers, urinary symptoms.  On exam today abdomen is soft with some epigastric tenderness, negative Murphy sign.  Vital signs are stable, afebrile.  Patient is nontoxic-appearing.  Treated with antiemetics and IV fluids.  Labs are very reassuring, no leukocytosis or anemia.  No electrolyte derangements, normal renal and hepatic function.  Urine is  negative for infection.  Lipase is within normal limits.  At this time, do not believe advanced imaging is indicated, given reassuring work-up and some improvement in symptoms or interventions.  Tolerating p.o. fluids.  Patient was prescribed Phenergan suppository by PCP, however has not used that yet.  Encouraged she use that if she is having little relief with Zofran.  Will discharge with clear liquid diet and instructions to follow-up closely with PCP.  Strict return precautions discussed.  Patient is agreeable to plan and safe for discharge.  Pt discussed with Dr. Roderic Palau.  Discussed results, findings, treatment and follow up. Patient advised of return precautions. Patient verbalized understanding and agreed with plan.   Final Clinical Impressions(s) / ED Diagnoses   Final diagnoses:  Non-intractable vomiting with nausea, unspecified vomiting type    ED Discharge Orders    None       , [place] N, PA-C 04/20/19 2047    Milton Ferguson, MD 04/22/19 1625

## 2019-04-20 NOTE — ED Notes (Signed)
Asked patient if she was able to give a urine sample but she said not right now and maybe after fluids are given.

## 2019-04-20 NOTE — ED Triage Notes (Signed)
Pt c/o vomiting and slight epigastric pain that started Saturday. Pt reports hx of GERD and she ate foods she shouldn't have on Saturday and then started vomiting. Pt reports the vomiting hasn't stopped. Pt saw Dr. Wolfgang Phoenix via telehealth today and was given nausea medication but she has been unable to keep it down.

## 2019-04-20 NOTE — Discharge Instructions (Addendum)
Please read instructions below. Drink clear liquids until your stomach feels better. Then, slowly introduce bland foods into your diet as tolerated, such as bread, rice, apples, bananas. Use the Phenergan suppositories and Zofran as prescribed as needed for nausea. It is recommended that you use and an acid, such as Pepcid.  You can also try liquid antacid such as Maalox for your stomach upset and GERD. Follow up with your primary care. Return to the ER for severe abdominal pain, fever, uncontrollable vomiting, or new or concerning symptoms.

## 2019-04-21 ENCOUNTER — Encounter: Payer: Self-pay | Admitting: Family Medicine

## 2019-04-22 ENCOUNTER — Telehealth: Payer: Self-pay | Admitting: Family Medicine

## 2019-04-22 DIAGNOSIS — K8020 Calculus of gallbladder without cholecystitis without obstruction: Secondary | ICD-10-CM

## 2019-04-22 DIAGNOSIS — R1110 Vomiting, unspecified: Secondary | ICD-10-CM

## 2019-04-22 DIAGNOSIS — K219 Gastro-esophageal reflux disease without esophagitis: Secondary | ICD-10-CM

## 2019-04-22 NOTE — Telephone Encounter (Signed)
Please go ahead with consultation with Dr.Rehman regarding severe reflux as well as frequent vomiting and discomfort-may need EGD

## 2019-04-22 NOTE — Telephone Encounter (Signed)
Referral put in.

## 2019-04-22 NOTE — Telephone Encounter (Signed)
Nurses Please set up HIDA test because of intractable vomiting and right upper quadrant discomfort The patient is ready to have this done as soon as they can schedule the test thanks

## 2019-04-22 NOTE — Telephone Encounter (Signed)
Trying to reach patient by phone.

## 2019-04-22 NOTE — Addendum Note (Signed)
Addended by: Carmelina Noun on: 04/22/2019 02:11 PM   Modules accepted: Orders

## 2019-04-23 ENCOUNTER — Encounter: Payer: Self-pay | Admitting: Family Medicine

## 2019-04-23 NOTE — Telephone Encounter (Signed)
HIDA test scheduled for Tuesday April 27 2019 at 8 am over at [facility]. Arrive at 7:45 am. NPO after midnight. Do not take any pain medications that morning. Pt will be there abou 2-2.5 hours. Pt contacted and informed of appt. Pt verbalized understanding.

## 2019-04-23 NOTE — Addendum Note (Signed)
Addended by: Vicente Males on: 04/23/2019 10:51 AM   Modules accepted: Orders

## 2019-04-27 ENCOUNTER — Other Ambulatory Visit: Payer: Self-pay

## 2019-04-27 ENCOUNTER — Encounter (HOSPITAL_COMMUNITY): Payer: Self-pay

## 2019-04-27 ENCOUNTER — Ambulatory Visit (HOSPITAL_COMMUNITY)
Admission: RE | Admit: 2019-04-27 | Discharge: 2019-04-27 | Disposition: A | Discharge status: Home / Self Care | Payer: BC Managed Care – PPO | Source: Ambulatory Visit | Attending: Family Medicine | Admitting: Family Medicine

## 2019-04-27 DIAGNOSIS — R1013 Epigastric pain: Secondary | ICD-10-CM | POA: Diagnosis not present

## 2019-04-27 DIAGNOSIS — K8020 Calculus of gallbladder without cholecystitis without obstruction: Secondary | ICD-10-CM | POA: Diagnosis not present

## 2019-04-27 MED ORDER — STERILE WATER FOR INJECTION IJ SOLN
INTRAMUSCULAR | Status: AC
  Administered 2019-04-27: 1.32 mL via INTRAVENOUS
  Filled 2019-04-27: qty 10

## 2019-04-27 MED ORDER — SINCALIDE 5 MCG IJ SOLR
INTRAMUSCULAR | Status: AC
  Administered 2019-04-27: 1.32 ug via INTRAVENOUS
  Filled 2019-04-27: qty 5

## 2019-04-27 MED ORDER — SODIUM CHLORIDE FLUSH 0.9 % IV SOLN
INTRAVENOUS | Status: AC
  Filled 2019-04-27: qty 40

## 2019-04-27 MED ORDER — TECHNETIUM TC 99M MEBROFENIN IV KIT
5.0000 | PACK | Freq: Once | INTRAVENOUS | Status: AC | PRN
  Administered 2019-04-27: 5.3 via INTRAVENOUS

## 2019-04-28 NOTE — Telephone Encounter (Signed)
Talked with the patient.  She states that June has been a rough month for her. It is also her month to provide care for her 53 year old mother with dementia in her home. The acid reflux has been horrible, she admits that she has been working from home ans eating a lot of junk , then going to bed. The nausea and vomiting have been awful , she has lost 11 lbs since 04/21/19. C/O burping a lot and states that they are sour. She has been on Zegrid for a long while. Patient did go to the ED on 04/20/19. While there she was given Zofran , Phenergan these did not help. Finally she was given a GI cocktail and this helped calm her esophagus. Her PCP ordered a Hida Scan to be done on 04/27/19. She is eating a bland diet that consist of crackers , broiled shrimp , cream potatoes ,chicken soup.  Patient has appointment with Terri on 05/03/19. I advised the patient that I would share this iinformation with Karna Christmas, I also ask that she call her insurance to see what PPI they cover.  She will bring with her to [facility] , records and notes that she has kept up with during this time.  Note , last abdominal ultrasound was done 07-10-2018, ordered by Dr.Rehman.

## 2019-05-03 ENCOUNTER — Ambulatory Visit (INDEPENDENT_AMBULATORY_CARE_PROVIDER_SITE_OTHER): Payer: BC Managed Care – PPO | Admitting: Internal Medicine

## 2019-05-03 ENCOUNTER — Encounter (INDEPENDENT_AMBULATORY_CARE_PROVIDER_SITE_OTHER): Payer: Self-pay | Admitting: *Deleted

## 2019-05-03 ENCOUNTER — Encounter (INDEPENDENT_AMBULATORY_CARE_PROVIDER_SITE_OTHER): Payer: Self-pay | Admitting: Internal Medicine

## 2019-05-03 ENCOUNTER — Other Ambulatory Visit (INDEPENDENT_AMBULATORY_CARE_PROVIDER_SITE_OTHER): Payer: Self-pay | Admitting: Internal Medicine

## 2019-05-03 ENCOUNTER — Other Ambulatory Visit: Payer: Self-pay

## 2019-05-03 VITALS — BP 122/80 | HR 92 | Temp 98.2°F | Ht 63.0 in | Wt 149.4 lb

## 2019-05-03 DIAGNOSIS — R1110 Vomiting, unspecified: Secondary | ICD-10-CM | POA: Insufficient documentation

## 2019-05-03 DIAGNOSIS — K219 Gastro-esophageal reflux disease without esophagitis: Secondary | ICD-10-CM

## 2019-05-03 DIAGNOSIS — R112 Nausea with vomiting, unspecified: Secondary | ICD-10-CM

## 2019-05-03 MED ORDER — PANTOPRAZOLE SODIUM 40 MG PO TBEC: 40.0000 mg | DELAYED_RELEASE_TABLET | Freq: Two times a day (BID) | ORAL | 3 refills | Status: DC

## 2019-05-03 NOTE — Progress Notes (Addendum)
Subjective:    Patient ID: Stephanie Bishop, female    DOB: Jul 15, 1966, 53 y.o.   MRN: [id number]  HPI Here today with c/o GERD/nausea. Underwent a HIDA scan 04/27/2019 for N and V.  IMPRESSION: Normal ejection fraction of radiotracer from the gallbladder. The patient did experience abdominal pressure sensation with the CCK administration. Cystic and common bile ducts are patent as is evidenced by visualization of gallbladder and small bowel.   Also underwent an US abdomen 07/10/2018 for epigastric pain.   IMPRESSION: 1. Increased hepatic echogenicity consistent with fatty infiltration and/or hepatocellular disease.  2.  No gallstones or biliary distention. Has lost 11 pounds since 04/21/2019. Has been on a bland diet (Crackers, broiled shrimp, cream potatoes, chicken soup). Her GERD has been terrible. She has been taking care of her mother who has dementia and is 29 yrs old. She tells me today she has been eating later in the day. She has dizziness. She thinks it is from her neck. On 61/13/2020 she ate fat back and home made ice cream. She says it was down hill from there. On 04/20/2019 she was seen in the ED for vomiting. Zofran did not help. Phenergan ?. GI cocktail helped. No vomiting since being seeing in the ED. She has held all her home medications.  She has a heavy weight feeling in her epigastric region. She says she can taste bile 12 hrs after she eats.  BMs are moving okay. No melena or BRRB.     Review of Systems Past Medical History:  Diagnosis Date  . GERD (gastroesophageal reflux disease)   . PONV (postoperative nausea and vomiting)     Past Surgical History:  Procedure Laterality Date  . BREAST BIOPSY Left 2012   benign  . BREAST CYST ASPIRATION Bilateral   . CERVICAL SPINE SURGERY     2001  . COLONOSCOPY  07/16/2012   Procedure: COLONOSCOPY;  Surgeon: Rogene Houston, MD;  Location: [facility];  Service: Endoscopy;  Laterality: N/A;  930     Allergies  Allergen Reactions  . Ciprofloxacin Other (See Comments)    Knots on Face     Current Outpatient Medications on File Prior to Visit  Medication Sig Dispense Refill  . calcium carbonate (TUMS - DOSED IN MG ELEMENTAL CALCIUM) 500 MG chewable tablet Chew 1 tablet by mouth as needed for indigestion or heartburn.    . cetirizine (ZYRTEC) 10 MG tablet Take 10 mg by mouth daily as needed for allergies.     . fluticasone (FLONASE) 50 MCG/ACT nasal spray USE TWO SPRAY(S) IN EACH NOSTRIL ONCE DAILY (Patient not taking: Reported on 04/20/2019) 16 g 6  . Omeprazole-Sodium Bicarbonate (ZEGERID) 20-1100 MG CAPS Take 1 capsule by mouth daily.     . ondansetron (ZOFRAN ODT) 8 MG disintegrating tablet Take one tablet TID prn (Patient not taking: Reported on 05/03/2019) 12 tablet 1  . promethazine (PHENERGAN) 25 MG suppository Place 1 suppository (25 mg total) rectally every 6 (six) hours as needed for nausea or vomiting. (Patient not taking: Reported on 05/03/2019) 6 each 1  . pseudoephedrine (SUDAFED) 30 MG tablet Take 30 mg by mouth daily as needed for congestion.     No current facility-administered medications on file prior to visit.         Objective:   Physical Exam Blood pressure 122/80, pulse 92, temperature 98.2 F (36.8 C), height 5\' 3"  (1.6 m), weight 149 lb 6.4 oz (67.8 kg), last menstrual period 06/24/2013. Alert  and oriented. Skin warm and dry. Oral mucosa is moist.   . Sclera anicteric, conjunctivae is pink. Thyroid not enlarged. No cervical lymphadenopathy. Lungs clear. Heart regular rate and rhythm.  Abdomen is soft. Bowel sounds are positive. No hepatomegaly. No abdominal masses felt. Epigastric tenderness.  No edema to lower extremities.         Assessment & Plan:  GERD. Am going to start her on Protonix 40mg  BID. EGD. CBC and Hepatic.

## 2019-05-03 NOTE — Patient Instructions (Signed)
EGD. Labs today

## 2019-05-03 NOTE — Telephone Encounter (Signed)
Patient in office today. EGD

## 2019-05-04 LAB — HEPATIC FUNCTION PANEL
AG Ratio: 1.7 (calc) (ref 1.0–2.5)
ALT: 16 U/L (ref 6–29)
AST: 20 U/L (ref 10–35)
Albumin: 4.6 g/dL (ref 3.6–5.1)
Alkaline phosphatase (APISO): 82 U/L (ref 37–153)
Bilirubin, Direct: 0.1 mg/dL (ref 0.0–0.2)
Globulin: 2.7 g/dL (calc) (ref 1.9–3.7)
Indirect Bilirubin: 0.3 mg/dL (calc) (ref 0.2–1.2)
Total Bilirubin: 0.4 mg/dL (ref 0.2–1.2)
Total Protein: 7.3 g/dL (ref 6.1–8.1)

## 2019-05-04 LAB — CBC WITH DIFFERENTIAL/PLATELET
Absolute Monocytes: 515 cells/uL (ref 200–950)
Basophils Absolute: 60 cells/uL (ref 0–200)
Basophils Relative: 1.2 %
Eosinophils Absolute: 120 cells/uL (ref 15–500)
Eosinophils Relative: 2.4 %
HCT: 47.2 % — ABNORMAL HIGH (ref 35.0–45.0)
Hemoglobin: 15.6 g/dL — ABNORMAL HIGH (ref 11.7–15.5)
Lymphs Abs: 1610 cells/uL (ref 850–3900)
MCH: 27.9 pg (ref 27.0–33.0)
MCHC: 33.1 g/dL (ref 32.0–36.0)
MCV: 84.4 fL (ref 80.0–100.0)
MPV: 12.3 fL (ref 7.5–12.5)
Monocytes Relative: 10.3 %
Neutro Abs: 2695 cells/uL (ref 1500–7800)
Neutrophils Relative %: 53.9 %
Platelets: 300 10*3/uL (ref 140–400)
RBC: 5.59 10*6/uL — ABNORMAL HIGH (ref 3.80–5.10)
RDW: 13.6 % (ref 11.0–15.0)
Total Lymphocyte: 32.2 %
WBC: 5 10*3/uL (ref 3.8–10.8)

## 2019-05-10 ENCOUNTER — Other Ambulatory Visit (HOSPITAL_COMMUNITY)
Admission: RE | Admit: 2019-05-10 | Discharge: 2019-05-10 | Disposition: A | Discharge status: Home / Self Care | Payer: BC Managed Care – PPO | Source: Ambulatory Visit | Attending: Internal Medicine | Admitting: Internal Medicine

## 2019-05-10 ENCOUNTER — Other Ambulatory Visit: Payer: Self-pay

## 2019-05-10 DIAGNOSIS — Z1159 Encounter for screening for other viral diseases: Secondary | ICD-10-CM | POA: Diagnosis not present

## 2019-05-10 DIAGNOSIS — Z01812 Encounter for preprocedural laboratory examination: Secondary | ICD-10-CM | POA: Insufficient documentation

## 2019-05-11 LAB — SARS CORONAVIRUS 2 (TAT 6-24 HRS): SARS Coronavirus 2: NEGATIVE

## 2019-05-12 ENCOUNTER — Encounter (HOSPITAL_COMMUNITY)
Admission: RE | Disposition: A | Discharge status: Home / Self Care | Payer: Self-pay | Source: Home / Self Care | Attending: Internal Medicine

## 2019-05-12 ENCOUNTER — Encounter (HOSPITAL_COMMUNITY): Payer: Self-pay | Admitting: *Deleted

## 2019-05-12 ENCOUNTER — Other Ambulatory Visit: Payer: Self-pay

## 2019-05-12 ENCOUNTER — Ambulatory Visit (HOSPITAL_COMMUNITY)
Admission: RE | Admit: 2019-05-12 | Discharge: 2019-05-12 | Disposition: A | Discharge status: Home / Self Care | Payer: BC Managed Care – PPO | Attending: Internal Medicine | Admitting: Internal Medicine

## 2019-05-12 DIAGNOSIS — R112 Nausea with vomiting, unspecified: Secondary | ICD-10-CM | POA: Insufficient documentation

## 2019-05-12 DIAGNOSIS — Z79899 Other long term (current) drug therapy: Secondary | ICD-10-CM | POA: Insufficient documentation

## 2019-05-12 DIAGNOSIS — Z7951 Long term (current) use of inhaled steroids: Secondary | ICD-10-CM | POA: Insufficient documentation

## 2019-05-12 DIAGNOSIS — K219 Gastro-esophageal reflux disease without esophagitis: Secondary | ICD-10-CM | POA: Diagnosis not present

## 2019-05-12 DIAGNOSIS — K2950 Unspecified chronic gastritis without bleeding: Secondary | ICD-10-CM | POA: Insufficient documentation

## 2019-05-12 DIAGNOSIS — K228 Other specified diseases of esophagus: Secondary | ICD-10-CM

## 2019-05-12 DIAGNOSIS — R1013 Epigastric pain: Secondary | ICD-10-CM | POA: Insufficient documentation

## 2019-05-12 DIAGNOSIS — K319 Disease of stomach and duodenum, unspecified: Secondary | ICD-10-CM | POA: Insufficient documentation

## 2019-05-12 DIAGNOSIS — K449 Diaphragmatic hernia without obstruction or gangrene: Secondary | ICD-10-CM | POA: Insufficient documentation

## 2019-05-12 DIAGNOSIS — R1110 Vomiting, unspecified: Secondary | ICD-10-CM | POA: Insufficient documentation

## 2019-05-12 DIAGNOSIS — K3189 Other diseases of stomach and duodenum: Secondary | ICD-10-CM

## 2019-05-12 HISTORY — PX: ESOPHAGOGASTRODUODENOSCOPY: SHX5428

## 2019-05-12 HISTORY — PX: BIOPSY: SHX5522

## 2019-05-12 SURGERY — EGD (ESOPHAGOGASTRODUODENOSCOPY)
Anesthesia: Moderate Sedation

## 2019-05-12 MED ORDER — MEPERIDINE HCL 50 MG/ML IJ SOLN
INTRAMUSCULAR | Status: AC
  Filled 2019-05-12: qty 1

## 2019-05-12 MED ORDER — LIDOCAINE VISCOUS HCL 2 % MT SOLN
OROMUCOSAL | Status: AC
  Filled 2019-05-12: qty 15

## 2019-05-12 MED ORDER — STERILE WATER FOR IRRIGATION IR SOLN
Status: DC | PRN
  Administered 2019-05-12: 1.5 mL

## 2019-05-12 MED ORDER — LIDOCAINE VISCOUS HCL 2 % MT SOLN
OROMUCOSAL | Status: DC | PRN
  Administered 2019-05-12: 1 via OROMUCOSAL

## 2019-05-12 MED ORDER — MEPERIDINE HCL 50 MG/ML IJ SOLN
INTRAMUSCULAR | Status: DC | PRN
  Administered 2019-05-12 (×2): 25 mg via INTRAVENOUS

## 2019-05-12 MED ORDER — SODIUM CHLORIDE 0.9 % IV SOLN
INTRAVENOUS | Status: DC
  Administered 2019-05-12: 1000 mL via INTRAVENOUS

## 2019-05-12 MED ORDER — MIDAZOLAM HCL 5 MG/5ML IJ SOLN
INTRAMUSCULAR | Status: AC
  Filled 2019-05-12: qty 10

## 2019-05-12 MED ORDER — MIDAZOLAM HCL 5 MG/5ML IJ SOLN
INTRAMUSCULAR | Status: DC | PRN
  Administered 2019-05-12: 2 mg via INTRAVENOUS
  Administered 2019-05-12: 1 mg via INTRAVENOUS
  Administered 2019-05-12: 2 mg via INTRAVENOUS
  Administered 2019-05-12: 1 mg via INTRAVENOUS

## 2019-05-12 NOTE — H&P (Signed)
Stephanie Bishop is an 53 y.o. female.   Chief Complaint: Patient is here for esophagogastroduodenoscopy. HPI: Patient is 53 year old Caucasian female who has several year history of GERD and had been doing well on Zegerid OTC.  For the past few months she has noted postprandial heartburn and regurgitation.  Both 3 weeks ago she developed intractable nausea and vomiting and was seen in emergency room.  She also had diarrhea but it has resolved.  She did not experience fever melena or rectal bleeding.  She has seen scant amount of blood in vomitus only after here multiple times.  Since her visit to emergency room she has changed her diet completely and has been on essentially bland food and still not getting better.  She is also experienced epigastric pain which she describes as pressure and dullness.  She has lost 11 pounds in less than a month.  She does not take OTC NSAIDs.  She does not drink alcohol or smoke cigarettes. She had ultrasound in September 2019 and was negative cholelithiasis. She had a HIDA scan few weeks ago and EF was 98%.  Past Medical History:  Diagnosis Date  . GERD (gastroesophageal reflux disease)   . PONV (postoperative nausea and vomiting)     Past Surgical History:  Procedure Laterality Date  . BREAST BIOPSY Left 2012   benign  . BREAST CYST ASPIRATION Bilateral   . CERVICAL SPINE SURGERY     2001  . COLONOSCOPY  07/16/2012   Procedure: COLONOSCOPY;  Surgeon: Rogene Houston, MD;  Location: [facility];  Service: Endoscopy;  Laterality: N/A;  930    Family History  Problem Relation Age of Onset  . Colon cancer Neg Hx    Social History:  reports that she has never smoked. She has never used smokeless tobacco. She reports that she does not drink alcohol or use drugs.  Allergies:  Allergies  Allergen Reactions  . Ciprofloxacin Other (See Comments)    Knots on Face     Medications Prior to Admission  Medication Sig Dispense Refill  . cetirizine (ZYRTEC) 10  MG tablet Take 10 mg by mouth daily as needed for allergies.     . fluticasone (FLONASE) 50 MCG/ACT nasal spray USE TWO SPRAY(S) IN EACH NOSTRIL ONCE DAILY (Patient taking differently: Place 1-2 sprays into both nostrils daily as needed for allergies. ) 16 g 6  . pantoprazole (PROTONIX) 40 MG tablet Take 1 tablet (40 mg total) by mouth 2 (two) times daily before a meal. 60 tablet 3  . promethazine (PHENERGAN) 25 MG suppository Place 1 suppository (25 mg total) rectally every 6 (six) hours as needed for nausea or vomiting. 6 each 1    No results found for this or any previous visit (from the past 48 hour(s)). No results found.  ROS  Blood pressure (!) 141/67, pulse 92, temperature 98.3 F (36.8 C), temperature source Oral, resp. rate 20, height 5\' 2"  (1.575 m), weight 65.8 kg, last menstrual period 10/11/2018, SpO2 100 %. Physical Exam  Constitutional: She appears well-developed and well-nourished.  HENT:  Mouth/Throat: Oropharynx is clear and moist.  Eyes: Conjunctivae are normal.  Neck: No thyromegaly present.  Cardiovascular: Normal rate, regular rhythm and normal heart sounds.  No murmur heard. Respiratory: Effort normal and breath sounds normal.  GI: Soft. She exhibits no distension and no mass. There is abdominal tenderness. There is no rebound.  Mild midepigastric tenderness.  Musculoskeletal:        General: No edema.  Lymphadenopathy:    She has no cervical adenopathy.  Neurological: She is alert.  Skin: Skin is warm and dry.     Assessment/Plan Nausea and vomiting. Poorly controlled GERD symptoms with current therapy. Diagnostic EGD.  Hildred Laser, MD 05/12/2019, 10:12 AM

## 2019-05-12 NOTE — Discharge Instructions (Signed)
No aspirin or NSAIDs for 24 hours. Resume pantoprazole.  Take 40 mg by mouth 30 minutes before breakfast and evening meal daily. Resume usual diet. No driving for 24 hours. Physician will call with biopsy results and further recommendations.   Upper Endoscopy, Adult, Care After This sheet gives you information about how to care for yourself after your procedure. Your health care provider may also give you more specific instructions. If you have problems or questions, contact your health care provider. What can I expect after the procedure? After the procedure, it is common to have:  A sore throat.  Mild stomach pain or discomfort.  Bloating.  Nausea. Follow these instructions at home:   Follow instructions from your health care provider about what to eat or drink after your procedure.  Return to your normal activities as told by your health care provider. Ask your health care provider what activities are safe for you.  Take over-the-counter and prescription medicines only as told by your health care provider.  Do not drive for 24 hours if you were given a sedative during your procedure.  Keep all follow-up visits as told by your health care provider. This is important. Contact a health care provider if you have:  A sore throat that lasts longer than one day.  Trouble swallowing. Get help right away if:  You vomit blood or your vomit looks like coffee grounds.  You have: ? A fever. ? Bloody, black, or tarry stools. ? A severe sore throat or you cannot swallow. ? Difficulty breathing. ? Severe pain in your chest or abdomen. Summary  After the procedure, it is common to have a sore throat, mild stomach discomfort, bloating, and nausea.  Do not drive for 24 hours if you were given a sedative during the procedure.  Follow instructions from your health care provider about what to eat or drink after your procedure.  Return to your normal activities as told by your  health care provider. This information is not intended to replace advice given to you by your health care provider. Make sure you discuss any questions you have with your health care provider. Document Released: 04/21/2012 Document Revised: 04/14/2018 Document Reviewed: 03/23/2018 Elsevier Patient Education  2020 [employer].  Hiatal Hernia  A hiatal hernia occurs when part of the stomach slides above the muscle that separates the abdomen from the chest (diaphragm). A person can be born with a hiatal hernia (congenital), or it may develop over time. In almost all cases of hiatal hernia, only the top part of the stomach pushes through the diaphragm. Many people have a hiatal hernia with no symptoms. The larger the hernia, the more likely it is that you will have symptoms. In some cases, a hiatal hernia allows stomach acid to flow back into the tube that carries food from your mouth to your stomach (esophagus). This may cause heartburn symptoms. Severe heartburn symptoms may mean that you have developed a condition called gastroesophageal reflux disease (GERD). What are the causes? This condition is caused by a weakness in the opening (hiatus) where the esophagus passes through the diaphragm to attach to the upper part of the stomach. A person may be born with a weakness in the hiatus, or a weakness can develop over time. What increases the risk? This condition is more likely to develop in:  Older people. Age is a major risk factor for a hiatal hernia, especially if you are over the age of 62.  Pregnant women.  People  who are overweight.  People who have frequent constipation. What are the signs or symptoms? Symptoms of this condition usually develop in the form of GERD symptoms. Symptoms include:  Heartburn.  Belching.  Indigestion.  Trouble swallowing.  Coughing or wheezing.  Sore throat.  Hoarseness.  Chest pain.  Nausea and vomiting. How is this diagnosed? This condition  may be diagnosed during testing for GERD. Tests that may be done include:  X-rays of your stomach or chest.  An upper gastrointestinal (GI) series. This is an X-ray exam of your GI tract that is taken after you swallow a chalky liquid that shows up clearly on the X-ray.  Endoscopy. This is a procedure to look into your stomach using a thin, flexible tube that has a tiny camera and light on the end of it. How is this treated? This condition may be treated by:  Dietary and lifestyle changes to help reduce GERD symptoms.  Medicines. These may include: ? Over-the-counter antacids. ? Medicines that make your stomach empty more quickly. ? Medicines that block the production of stomach acid (H2 blockers). ? Stronger medicines to reduce stomach acid (proton pump inhibitors).  Surgery to repair the hernia, if other treatments are not helping. If you have no symptoms, you may not need treatment. Follow these instructions at home: Lifestyle and activity  Do not use any products that contain nicotine or tobacco, such as cigarettes and e-cigarettes. If you need help quitting, ask your health care provider.  Try to achieve and maintain a healthy body weight.  Avoid putting pressure on your abdomen. Anything that puts pressure on your abdomen increases the amount of acid that may be pushed up into your esophagus. ? Avoid bending over, especially after eating. ? Raise the head of your bed by putting blocks under the legs. This keeps your head and esophagus higher than your stomach. ? Do not wear tight clothing around your chest or stomach. ? Try not to strain when having a bowel movement, when urinating, or when lifting heavy objects. Eating and drinking  Avoid foods that can worsen GERD symptoms. These may include: ? Fatty foods, like fried foods. ? Citrus fruits, like oranges or lemon. ? Other foods and drinks that contain acid, like orange juice or tomatoes. ? Spicy  food. ? Chocolate.  Eat frequent small meals instead of three large meals a day. This helps prevent your stomach from getting too full. ? Eat slowly. ? Do not lie down right after eating. ? Do not eat 1-2 hours before bed.  Do not drink beverages with caffeine. These include cola, coffee, cocoa, and tea.  Do not drink alcohol. General instructions  Take over-the-counter and prescription medicines only as told by your health care provider.  Keep all follow-up visits as told by your health care provider. This is important. Contact a health care provider if:  Your symptoms are not controlled with medicines or lifestyle changes.  You are having trouble swallowing.  You have coughing or wheezing that will not go away. Get help right away if:  Your pain is getting worse.  Your pain spreads to your arms, neck, jaw, teeth, or back.  You have shortness of breath.  You sweat for no reason.  You feel sick to your stomach (nauseous) or you vomit.  You vomit blood.  You have bright red blood in your stools.  You have black, tarry stools. This information is not intended to replace advice given to you by your health care provider.  Make sure you discuss any questions you have with your health care provider. Document Released: 01/11/2004 Document Revised: 10/03/2017 Document Reviewed: 05/26/2017 Elsevier Patient Education  2020 [employer].

## 2019-05-12 NOTE — Op Note (Signed)
[facility] Patient Name: Stephanie Bishop Procedure Date: 05/12/2019 9:41 AM MRN: [id number] Date of Birth: 1966/09/19 Attending MD: Hildred Laser , MD CSN: [id number] Age: 53 Admit Type: Outpatient Procedure:                Upper GI endoscopy Indications:              Epigastric abdominal pain, Nausea with vomiting Providers:                Hildred Laser, MD, Charlsie Quest. Theda Sers RN, RN,                            Raphael Gibney, Technician Referring MD:             Elayne Snare. Wolfgang Phoenix, MD Medicines:                Lidocaine jelly, Meperidine 50 mg IV, Midazolam 6                            mg IV Complications:            No immediate complications. Estimated Blood Loss:     Estimated blood loss was minimal. Procedure:                Pre-Anesthesia Assessment:                           - Prior to the procedure, a History and Physical                            was performed, and patient medications and                            allergies were reviewed. The patient's tolerance of                            previous anesthesia was also reviewed. The risks                            and benefits of the procedure and the sedation                            options and risks were discussed with the patient.                            All questions were answered, and informed consent                            was obtained. Prior Anticoagulants: The patient has                            taken no previous anticoagulant or antiplatelet                            agents. ASA Grade Assessment: I - A normal, healthy  patient. After reviewing the risks and benefits,                            the patient was deemed in satisfactory condition to                            undergo the procedure.                           After obtaining informed consent, the endoscope was                            passed under direct vision. Throughout the   procedure, the patient's blood pressure, pulse, and                            oxygen saturations were monitored continuously. The                            GIF-[id number] ([id number]) was introduced through the                            mouth, and advanced to the second part of duodenum.                            The upper GI endoscopy was accomplished without                            difficulty. The patient tolerated the procedure                            well. Scope In: 10:25:35 AM Scope Out: 10:35:05 AM Total Procedure Duration: 0 hours 9 minutes 30 seconds  Findings:      The examined esophagus was normal.      The Z-line was irregular and was found 37 cm from the incisors.      A 2 cm hiatal hernia was present.      Patchy mildly erythematous mucosa without bleeding was found in the       gastric antrum. Biopsies were taken with a cold forceps for histology.       The pathology specimen was placed into Bottle Number 2.      The exam of the stomach was otherwise normal.      The duodenal bulb and second portion of the duodenum were normal.       Biopsies were taken from D2 with a cold forceps for histology. The       pathology specimen was placed into Bottle Number 1. Impression:               - Normal esophagus.                           - Z-line irregular, 37 cm from the incisors.                           - 2 cm hiatal hernia.                           -  Erythematous mucosa in the antrum. Biopsied.                           - Normal duodenal bulb and second portion of the                            duodenum. Biopsied. Moderate Sedation:      Moderate (conscious) sedation was administered by the endoscopy nurse       and supervised by the endoscopist. The following parameters were       monitored: oxygen saturation, heart rate, blood pressure, CO2       capnography and response to care. Total physician intraservice time was       15 minutes. Recommendation:           - Patient  has a contact number available for                            emergencies. The signs and symptoms of potential                            delayed complications were discussed with the                            patient. Return to normal activities tomorrow.                            Written discharge instructions were provided to the                            patient.                           - Resume previous diet today.                           - Continue present medications.                           - No aspirin, ibuprofen, naproxen, or other                            non-steroidal anti-inflammatory drugs for 1 day.                           - Await pathology results. Procedure Code(s):        --- Professional ---                           [phone], Esophagogastroduodenoscopy, flexible,                            transoral; with biopsy, single or multiple                           G0500, Moderate sedation services provided by the  same physician or other qualified health care                            professional performing a gastrointestinal                            endoscopic service that sedation supports,                            requiring the presence of an independent trained                            observer to assist in the monitoring of the                            patient's level of consciousness and physiological                            status; initial 15 minutes of intra-service time;                            patient age 48 years or older (additional time may                            be reported with [phone], as appropriate) Diagnosis Code(s):        --- Professional ---                           K22.8, Other specified diseases of esophagus                           K44.9, Diaphragmatic hernia without obstruction or                            gangrene                           K31.89, Other diseases of stomach and duodenum                            R10.13, Epigastric pain                           R11.2, Nausea with vomiting, unspecified CPT copyright 2019 American Medical Association. All rights reserved. The codes documented in this report are preliminary and upon coder review may  be revised to meet current compliance requirements. Hildred Laser, MD Hildred Laser, MD 05/12/2019 10:43:03 AM This report has been signed electronically. Number of Addenda: 0

## 2019-05-17 ENCOUNTER — Telehealth (INDEPENDENT_AMBULATORY_CARE_PROVIDER_SITE_OTHER): Payer: Self-pay | Admitting: Internal Medicine

## 2019-05-17 ENCOUNTER — Encounter (HOSPITAL_COMMUNITY): Payer: Self-pay | Admitting: Internal Medicine

## 2019-05-17 NOTE — Telephone Encounter (Signed)
Patient called stated she had spoke with NUR over the weekend and he wanted to know how she was feeling - she stated she is still not feeling well and would like to proceed with the procedure he had mentioned   Ph# [phone]

## 2019-05-18 ENCOUNTER — Other Ambulatory Visit (INDEPENDENT_AMBULATORY_CARE_PROVIDER_SITE_OTHER): Payer: Self-pay | Admitting: Internal Medicine

## 2019-05-18 DIAGNOSIS — R112 Nausea with vomiting, unspecified: Secondary | ICD-10-CM

## 2019-05-18 DIAGNOSIS — R634 Abnormal weight loss: Secondary | ICD-10-CM

## 2019-05-18 DIAGNOSIS — R1084 Generalized abdominal pain: Secondary | ICD-10-CM

## 2019-05-19 NOTE — Telephone Encounter (Signed)
It appears that the patient is already posted for 06/04/19 , and has been given instructions.

## 2019-06-04 ENCOUNTER — Ambulatory Visit (HOSPITAL_COMMUNITY)
Admission: RE | Admit: 2019-06-04 | Discharge: 2019-06-04 | Disposition: A | Discharge status: Home / Self Care | Payer: BC Managed Care – PPO | Source: Ambulatory Visit | Attending: Internal Medicine | Admitting: Internal Medicine

## 2019-06-04 ENCOUNTER — Other Ambulatory Visit: Payer: Self-pay

## 2019-06-04 ENCOUNTER — Other Ambulatory Visit (HOSPITAL_COMMUNITY): Payer: BC Managed Care – PPO

## 2019-06-04 DIAGNOSIS — R1110 Vomiting, unspecified: Secondary | ICD-10-CM | POA: Diagnosis not present

## 2019-06-04 DIAGNOSIS — R197 Diarrhea, unspecified: Secondary | ICD-10-CM | POA: Diagnosis not present

## 2019-06-04 DIAGNOSIS — R634 Abnormal weight loss: Secondary | ICD-10-CM | POA: Diagnosis not present

## 2019-06-04 DIAGNOSIS — R112 Nausea with vomiting, unspecified: Secondary | ICD-10-CM | POA: Diagnosis not present

## 2019-06-04 DIAGNOSIS — R1084 Generalized abdominal pain: Secondary | ICD-10-CM | POA: Diagnosis not present

## 2019-06-04 LAB — POCT I-STAT CREATININE: Creatinine, Ser: 1 mg/dL (ref 0.44–1.00)

## 2019-06-04 MED ORDER — IOHEXOL 300 MG/ML  SOLN
100.0000 mL | Freq: Once | INTRAMUSCULAR | Status: AC | PRN
  Administered 2019-06-04: 100 mL via INTRAVENOUS

## 2019-06-08 ENCOUNTER — Other Ambulatory Visit (INDEPENDENT_AMBULATORY_CARE_PROVIDER_SITE_OTHER): Payer: Self-pay | Admitting: Internal Medicine

## 2019-06-08 DIAGNOSIS — R112 Nausea with vomiting, unspecified: Secondary | ICD-10-CM

## 2019-06-15 ENCOUNTER — Other Ambulatory Visit: Payer: Self-pay

## 2019-06-15 ENCOUNTER — Ambulatory Visit (HOSPITAL_COMMUNITY)
Admission: RE | Admit: 2019-06-15 | Discharge: 2019-06-15 | Disposition: A | Discharge status: Home / Self Care | Payer: BC Managed Care – PPO | Source: Ambulatory Visit | Attending: Internal Medicine | Admitting: Internal Medicine

## 2019-06-15 DIAGNOSIS — R110 Nausea: Secondary | ICD-10-CM | POA: Diagnosis not present

## 2019-06-15 DIAGNOSIS — R112 Nausea with vomiting, unspecified: Secondary | ICD-10-CM | POA: Insufficient documentation

## 2019-06-15 DIAGNOSIS — R109 Unspecified abdominal pain: Secondary | ICD-10-CM | POA: Diagnosis not present

## 2019-06-15 MED ORDER — TECHNETIUM TC 99M SULFUR COLLOID
2.0000 | Freq: Once | INTRAVENOUS | Status: AC | PRN
  Administered 2019-06-15: 1.86 via ORAL

## 2019-06-18 ENCOUNTER — Other Ambulatory Visit (INDEPENDENT_AMBULATORY_CARE_PROVIDER_SITE_OTHER): Payer: Self-pay | Admitting: Internal Medicine

## 2019-06-18 MED ORDER — METOCLOPRAMIDE HCL 10 MG PO TABS: 10.0000 mg | ORAL_TABLET | Freq: Three times a day (TID) | ORAL | 0 refills | Status: DC

## 2019-06-18 NOTE — Progress Notes (Unsigned)
reglan

## 2019-07-20 DIAGNOSIS — M542 Cervicalgia: Secondary | ICD-10-CM | POA: Diagnosis not present

## 2019-07-20 DIAGNOSIS — R42 Dizziness and giddiness: Secondary | ICD-10-CM | POA: Diagnosis not present

## 2019-07-20 DIAGNOSIS — N951 Menopausal and female climacteric states: Secondary | ICD-10-CM | POA: Diagnosis not present

## 2019-07-20 DIAGNOSIS — N959 Unspecified menopausal and perimenopausal disorder: Secondary | ICD-10-CM | POA: Diagnosis not present

## 2019-07-27 DIAGNOSIS — M5481 Occipital neuralgia: Secondary | ICD-10-CM | POA: Diagnosis not present

## 2019-07-27 DIAGNOSIS — M4722 Other spondylosis with radiculopathy, cervical region: Secondary | ICD-10-CM | POA: Diagnosis not present

## 2019-07-27 DIAGNOSIS — M542 Cervicalgia: Secondary | ICD-10-CM | POA: Diagnosis not present

## 2019-07-27 DIAGNOSIS — R42 Dizziness and giddiness: Secondary | ICD-10-CM | POA: Diagnosis not present

## 2019-07-27 DIAGNOSIS — M5412 Radiculopathy, cervical region: Secondary | ICD-10-CM | POA: Diagnosis not present

## 2019-08-09 DIAGNOSIS — L819 Disorder of pigmentation, unspecified: Secondary | ICD-10-CM | POA: Diagnosis not present

## 2019-08-09 DIAGNOSIS — D485 Neoplasm of uncertain behavior of skin: Secondary | ICD-10-CM | POA: Diagnosis not present

## 2019-08-09 DIAGNOSIS — L859 Epidermal thickening, unspecified: Secondary | ICD-10-CM | POA: Diagnosis not present

## 2019-08-26 ENCOUNTER — Other Ambulatory Visit: Payer: Self-pay | Admitting: Obstetrics and Gynecology

## 2019-08-26 DIAGNOSIS — Z1231 Encounter for screening mammogram for malignant neoplasm of breast: Secondary | ICD-10-CM

## 2019-10-04 DIAGNOSIS — N952 Postmenopausal atrophic vaginitis: Secondary | ICD-10-CM | POA: Diagnosis not present

## 2019-10-04 DIAGNOSIS — N819 Female genital prolapse, unspecified: Secondary | ICD-10-CM | POA: Diagnosis not present

## 2019-10-04 DIAGNOSIS — Z6827 Body mass index (BMI) 27.0-27.9, adult: Secondary | ICD-10-CM | POA: Diagnosis not present

## 2019-10-04 DIAGNOSIS — Z01419 Encounter for gynecological examination (general) (routine) without abnormal findings: Secondary | ICD-10-CM | POA: Diagnosis not present

## 2019-10-15 ENCOUNTER — Ambulatory Visit
Admission: RE | Admit: 2019-10-15 | Discharge: 2019-10-15 | Disposition: A | Discharge status: Home / Self Care | Payer: BC Managed Care – PPO | Source: Ambulatory Visit | Attending: Obstetrics and Gynecology | Admitting: Obstetrics and Gynecology

## 2019-10-15 ENCOUNTER — Other Ambulatory Visit: Payer: Self-pay

## 2019-10-15 DIAGNOSIS — Z1231 Encounter for screening mammogram for malignant neoplasm of breast: Secondary | ICD-10-CM

## 2019-11-09 ENCOUNTER — Other Ambulatory Visit: Payer: Self-pay

## 2019-11-09 ENCOUNTER — Ambulatory Visit (INDEPENDENT_AMBULATORY_CARE_PROVIDER_SITE_OTHER): Payer: 59 | Admitting: Family Medicine

## 2019-11-09 DIAGNOSIS — U071 COVID-19: Secondary | ICD-10-CM

## 2019-11-09 DIAGNOSIS — Z20822 Contact with and (suspected) exposure to covid-19: Secondary | ICD-10-CM | POA: Diagnosis not present

## 2019-11-09 MED ORDER — CEFDINIR 300 MG PO CAPS: 300.0000 mg | ORAL_CAPSULE | Freq: Two times a day (BID) | ORAL | 0 refills | Status: DC

## 2019-11-09 NOTE — Progress Notes (Signed)
   Subjective:  Audio  Patient ID: Stephanie Bishop, female    DOB: June 29, 1966, 54 y.o.   MRN: [id number]  Otalgia  There is pain in both ears. This is a new problem. Episode onset: 10 days. Maximum temperature: does not thinks she has a fever but hard to tell with hot flashes. Associated symptoms comments: Drainage in throat. Treatments tried: zyrtec, sudafed, robitussin dm.   Virtual Visit via Telephone Note  I connected with [name] on 11/09/19 at  1:10 PM EST by telephone and verified that I am speaking with the correct person using two identifiers.  Location: Patient: home Provider: office   I discussed the limitations, risks, security and privacy concerns of performing an evaluation and management service by telephone and the availability of in person appointments. I also discussed with the patient that there may be a patient responsible charge related to this service. The patient expressed understanding and agreed to proceed.   History of Present Illness:    Observations/Objective:   Assessment and Plan:   Follow Up Instructions:    I discussed the assessment and treatment plan with the patient. The patient was provided an opportunity to ask questions and all were answered. The patient agreed with the plan and demonstrated an understanding of the instructions.   The patient was advised to call back or seek an in-person evaluation if the symptoms worsen or if the condition fails to improve as anticipated.  I provided 1818 minutes of non-face-to-face time during this encounter.  Throat drainage plus ear ache   Pain in throat   Doubt feer   No fever but hot flashes    Review of Systems  HENT: Positive for ear pain.        Objective:   Physical Exam   Virtual     Assessment & Plan:  Impression suspected coronavirus.  Discussed.  Positive exposure.  Also element of ear symptoms and sinus symptoms so will cover with antibiotic.  Await testing.   Nature of quarantine discussed.  [name] of isolation discussed general questions answered

## 2019-11-10 ENCOUNTER — Ambulatory Visit: Payer: 59 | Attending: Internal Medicine

## 2019-11-10 DIAGNOSIS — Z20822 Contact with and (suspected) exposure to covid-19: Secondary | ICD-10-CM

## 2019-11-11 LAB — NOVEL CORONAVIRUS, NAA: SARS-CoV-2, NAA: DETECTED — AB

## 2019-11-12 ENCOUNTER — Telehealth: Payer: Self-pay | Admitting: Family Medicine

## 2019-11-12 NOTE — Telephone Encounter (Signed)
Diarrhea certainly can happen with the antibiotic but it can also happen with Covid Good hydration important as well as soft foods and soft diet More than likely will not need antibiotic more than 7 days

## 2019-11-12 NOTE — Telephone Encounter (Signed)
Pt tested positive for COVID. Contacted pt to see how she was doing. Pt states she is doing fine but is having diarrhea with antibiotic. Pt had no other question or concerns.

## 2019-11-15 NOTE — Telephone Encounter (Signed)
Discussed with pt. Pt verbalized understanding.  °

## 2019-11-22 ENCOUNTER — Telehealth (INDEPENDENT_AMBULATORY_CARE_PROVIDER_SITE_OTHER): Payer: Self-pay | Admitting: Gastroenterology

## 2019-11-22 DIAGNOSIS — K219 Gastro-esophageal reflux disease without esophagitis: Secondary | ICD-10-CM

## 2019-11-22 MED ORDER — PANTOPRAZOLE SODIUM 40 MG PO TBEC: 40.0000 mg | DELAYED_RELEASE_TABLET | Freq: Two times a day (BID) | ORAL | 3 refills | Status: DC

## 2019-11-22 NOTE — Telephone Encounter (Signed)
Refill request for pantoprazole received, will send to pharmacy.

## 2020-01-20 ENCOUNTER — Other Ambulatory Visit: Payer: Self-pay

## 2020-01-20 ENCOUNTER — Ambulatory Visit (INDEPENDENT_AMBULATORY_CARE_PROVIDER_SITE_OTHER): Payer: 59

## 2020-01-20 ENCOUNTER — Ambulatory Visit (INDEPENDENT_AMBULATORY_CARE_PROVIDER_SITE_OTHER): Payer: 59 | Admitting: Podiatry

## 2020-01-20 ENCOUNTER — Encounter: Payer: Self-pay | Admitting: Podiatry

## 2020-01-20 VITALS — BP 128/81 | HR 98 | Temp 96.3°F | Resp 16

## 2020-01-20 DIAGNOSIS — M722 Plantar fascial fibromatosis: Secondary | ICD-10-CM

## 2020-01-20 MED ORDER — METHYLPREDNISOLONE 4 MG PO TBPK: ORAL_TABLET | ORAL | 0 refills | Status: DC

## 2020-01-20 MED ORDER — MELOXICAM 15 MG PO TABS: 15.0000 mg | ORAL_TABLET | Freq: Every day | ORAL | 3 refills | Status: DC

## 2020-01-20 NOTE — Patient Instructions (Signed)

## 2020-01-20 NOTE — Progress Notes (Signed)
ROS  Subjective:  Patient ID: Stephanie Bishop, female    DOB: [birth year],  MRN: [id number] HPI Chief Complaint  Patient presents with  . Foot Pain    Plantar heel and lateral side right - aching x several months, AM pain, tried Ibuprofen, tried son's PF braces, acewrap, different shoes    54 y.o. female presents with the above complaint.   Ros: Denies fever chills nausea vomiting muscle aches pains calf pain back pain chest pain shortness of breath.  Past Medical History:  Diagnosis Date  . GERD (gastroesophageal reflux disease)   . PONV (postoperative nausea and vomiting)    Past Surgical History:  Procedure Laterality Date  . BIOPSY  05/12/2019   Procedure: BIOPSY;  Surgeon: Rogene Houston, MD;  [facility];  Service: Endoscopy;;  duodenum, antrum  . BREAST BIOPSY Left 2012   benign  . BREAST CYST ASPIRATION Bilateral   . CERVICAL SPINE SURGERY     2001  . COLONOSCOPY  07/16/2012   Procedure: COLONOSCOPY;  Surgeon: Rogene Houston, MD;  [facility];  Service: Endoscopy;  Laterality: N/A;  930  . ESOPHAGOGASTRODUODENOSCOPY N/A 05/12/2019   Procedure: ESOPHAGOGASTRODUODENOSCOPY (EGD);  Surgeon: Rogene Houston, MD;  [facility];  Service: Endoscopy;  Laterality: N/A;  245    Current Outpatient Medications:  .  cetirizine (ZYRTEC) 10 MG tablet, Take 10 mg by mouth daily as needed for allergies. , Disp: , Rfl:  .  fluticasone (FLONASE) 50 MCG/ACT nasal spray, USE TWO SPRAY(S) IN EACH NOSTRIL ONCE DAILY (Patient taking differently: Place 1-2 sprays into both nostrils daily as needed for allergies. ), Disp: 16 g, Rfl: 6 .  meloxicam (MOBIC) 15 MG tablet, Take 1 tablet (15 mg total) by mouth daily., Disp: 30 tablet, Rfl: 3 .  methylPREDNISolone (MEDROL DOSEPAK) 4 MG TBPK tablet, 6 day dose pack - take as directed, Disp: 21 tablet, Rfl: 0 .  neomycin-polymyxin b-dexamethasone (MAXITROL) 3.5-10000-0.1 OINT, APPLY A SMALL AMOUNT OF OINTMENT ON EYELID  TWICE A DAY FOR 7 DAYS, Disp: , Rfl:  .  pantoprazole (PROTONIX) 40 MG tablet, Take 1 tablet (40 mg total) by mouth 2 (two) times daily before a meal., Disp: 60 tablet, Rfl: 3  Allergies  Allergen Reactions  . Ciprofloxacin Other (See Comments)    Knots on Face    Review of Systems Objective:   Vitals:   01/20/20 1029  BP: 128/81  Pulse: 98  Resp: 16  Temp: (!) 96.3 F (35.7 C)    General: Well developed, nourished, in no acute distress, alert and oriented x3   Dermatological: Skin is warm, dry and supple bilateral. Nails x 10 are well maintained; remaining integument appears unremarkable at this time. There are no open sores, no preulcerative lesions, no rash or signs of infection present.  Vascular: Dorsalis Pedis artery and Posterior Tibial artery pedal pulses are 2/4 bilateral with immedate capillary fill time. Pedal hair growth present. No varicosities and no lower extremity edema present bilateral.   Neruologic: Grossly intact via light touch bilateral. Vibratory intact via tuning fork bilateral. Protective threshold with Semmes Wienstein monofilament intact to all pedal sites bilateral. Patellar and Achilles deep tendon reflexes 2+ bilateral. No Babinski or clonus noted bilateral.   Musculoskeletal: No gross boney pedal deformities bilateral. No pain, crepitus, or limitation noted with foot and ankle range of motion bilateral. Muscular strength 5/5 in all groups tested bilateral.  She has pain on palpation medial calcaneal tubercle of  the right heel.  No pain on medial and lateral compression of the calcaneus.  Gait: Unassisted, Nonantalgic.    Radiographs:  Radiographs taken today demonstrate soft tissue increase in density plantar fifth calcaneal insertion site of the right heel.  Mild plantar spur is noted.  Assessment & Plan:   Assessment: Plantar fasciitis right foot.  Plan: We discussed the etiology pathology conservative versus surgical therapies.  We discussed  appropriate shoe gear stretching exercises ice therapy and shoe gear modifications.  I injected the right heel today with 20 mg of Kenalog 5 mg Marcaine to the point of maximal tenderness at the plantar fascial calcaneal insertion site of the right heel.  Start her on a Medrol Dosepak to be followed by meloxicam.  Dispensed a plantar fascial brace and and a night splint.  I will follow-up with her in about 1 month.      T. [place], [place]

## 2020-02-22 ENCOUNTER — Ambulatory Visit (INDEPENDENT_AMBULATORY_CARE_PROVIDER_SITE_OTHER): Payer: 59 | Admitting: Podiatry

## 2020-02-22 ENCOUNTER — Other Ambulatory Visit: Payer: Self-pay

## 2020-02-22 ENCOUNTER — Encounter: Payer: Self-pay | Admitting: Podiatry

## 2020-02-22 DIAGNOSIS — M722 Plantar fascial fibromatosis: Secondary | ICD-10-CM

## 2020-02-22 NOTE — Progress Notes (Signed)
She presents today for follow-up of her plantar fasciitis.  States that her redness and gave her a migraine and she did not take her meloxicam because she was taking care of her 53 year old mother and she is afraid that it will put her in a similar condition that the prednisone did.  She states that she does wear the boot but it puts her foot to sleep but she did not try removing the wedge.  I did also ask her if she were to any of the time and she wears it during the day when she sitting or at night while she is sitting before she goes to bed.  She loves the plantar fascial brace.  And continues to wear her old need of new balance tennis shoes.  70 to 80% improvement.   Objective: Vital signs are stable alert and oriented x3.  Pulses are palpable.  She has pain on palpation medial calcaneal tubercle of the right heel.  Assessment: 70 to 80% resolution of plantar fasciitis right.  Plan: At this point went ahead and reinjected the right heel today 20 mg Kenalog 5 mg Marcaine point maximal tenderness.  Encouraged her to take the meloxicam we will follow-up with her in 1 month and see how well she did.

## 2020-02-28 ENCOUNTER — Other Ambulatory Visit (INDEPENDENT_AMBULATORY_CARE_PROVIDER_SITE_OTHER): Payer: Self-pay | Admitting: *Deleted

## 2020-02-28 NOTE — Telephone Encounter (Signed)
Patient will need to have OV in 5 months in order to get refills on her PPI. A message has been sent to  [facility].

## 2020-03-21 ENCOUNTER — Ambulatory Visit (INDEPENDENT_AMBULATORY_CARE_PROVIDER_SITE_OTHER): Payer: 59 | Admitting: Podiatry

## 2020-03-21 ENCOUNTER — Other Ambulatory Visit: Payer: Self-pay

## 2020-03-21 ENCOUNTER — Encounter: Payer: Self-pay | Admitting: Podiatry

## 2020-03-21 DIAGNOSIS — M722 Plantar fascial fibromatosis: Secondary | ICD-10-CM | POA: Diagnosis not present

## 2020-03-21 NOTE — Progress Notes (Signed)
She presents today for follow-up of her right heel second shot really did not do as good as the first 1.  Objective: Vital signs are stable alert oriented x3 still has moderate severe pain on palpation medial calcaneal tubercle.  No open lesions or wounds.  Pulses remain palpable.  Assessment: Plantar fasciitis right chronic.  Plan: Reinjected the area again today continue to wear the brace and appropriate shoe gear.  Follow-up with me in 1 month consider MRI.

## 2020-04-25 ENCOUNTER — Ambulatory Visit (INDEPENDENT_AMBULATORY_CARE_PROVIDER_SITE_OTHER): Payer: 59 | Admitting: Podiatry

## 2020-04-25 ENCOUNTER — Telehealth: Payer: Self-pay | Admitting: *Deleted

## 2020-04-25 ENCOUNTER — Other Ambulatory Visit: Payer: Self-pay

## 2020-04-25 ENCOUNTER — Encounter: Payer: Self-pay | Admitting: Podiatry

## 2020-04-25 DIAGNOSIS — S93691A Other sprain of right foot, initial encounter: Secondary | ICD-10-CM | POA: Diagnosis not present

## 2020-04-25 DIAGNOSIS — M722 Plantar fascial fibromatosis: Secondary | ICD-10-CM

## 2020-04-25 NOTE — Telephone Encounter (Signed)
-----   Message from Rip Harbour, [facility] sent at 04/25/2020  2:17 PM EDT ----- Regarding: MRI MRI right ankle - evaluate plantar fascial tear right - surgical consideration  She requests [facility]

## 2020-04-25 NOTE — Progress Notes (Signed)
She presents today for follow-up of her plantar fasciitis.  She states that is not getting any better.  Objective: She has pain on palpation medial calcaneal tubercle of the right heel.  Assessment: Plan fasciitis probable tear of the plantar fascia since it is not getting any better.  Plan: At this point secondary to failure to improve with conservative therapies I think an MRI is necessary to evaluate the attachment of the plantar fascia at the calcaneus.  Most likely there is a tear that is causing this chronicity.  I will follow-up with her once the MRI is complete.

## 2020-04-25 NOTE — Telephone Encounter (Signed)
Orders prepared to be faxed to [facility] for pre-cert and scheduling once the 04/25/2020 clinicals are available.

## 2020-04-26 NOTE — Telephone Encounter (Signed)
Pt called and stated she wanted to go to [facility] for MRI  Not [facility]

## 2020-05-29 ENCOUNTER — Other Ambulatory Visit: Payer: Self-pay

## 2020-05-29 ENCOUNTER — Ambulatory Visit
Admission: RE | Admit: 2020-05-29 | Discharge: 2020-05-29 | Disposition: A | Discharge status: Home / Self Care | Payer: 59 | Source: Ambulatory Visit | Attending: Podiatry | Admitting: Podiatry

## 2020-06-01 ENCOUNTER — Ambulatory Visit: Payer: 59 | Admitting: Podiatry

## 2020-06-01 ENCOUNTER — Encounter: Payer: Self-pay | Admitting: Podiatry

## 2020-06-01 ENCOUNTER — Ambulatory Visit (INDEPENDENT_AMBULATORY_CARE_PROVIDER_SITE_OTHER): Payer: 59 | Admitting: Podiatry

## 2020-06-01 ENCOUNTER — Other Ambulatory Visit: Payer: Self-pay

## 2020-06-01 DIAGNOSIS — S93691D Other sprain of right foot, subsequent encounter: Secondary | ICD-10-CM

## 2020-06-01 NOTE — Progress Notes (Signed)
She presents today for follow-up of her MRI stating that her right foot is not doing any better at all.  She states that it feels like it is somewhat worse.  Objective: Vital signs are stable alert and oriented x3.  Pulses are palpable.  She has pain on palpation medial calcaneal tubercle of the right heel.  No central band or lateral pain.  Radiographs reviewed do demonstrate soft tissue increase in density in the area.  Also reviewed the MRI today which does demonstrate a small tear of the insertion site of the medial band of the plantar fascia.  Assessment: Chronic proximal foot plantar fasciitis medial band with a partial tear.  Plan: Discussed etiology pathology conservative versus surgical therapies at this point we consented her for a medial endoscopic plantar fasciotomy of the right foot.  We discussed the possible side effects which may include but are not limited to postop pain bleeding swelling infection recurrence need for further surgery overcorrection under correction loss of digit loss of limb loss of life.  Provided her with information regarding the surgery center and anesthesia group as well as instructions for the morning of surgery.  Dispensed a Cam walker for her to utilize prior to the procedures.  And after.  I will follow-up with her in the near future for surgical intervention.

## 2020-06-02 ENCOUNTER — Telehealth: Payer: Self-pay

## 2020-06-02 NOTE — Telephone Encounter (Signed)
DOS 06/09/2020  EPF RT - [facility] FROM [facility]. NO PRECERT REQUIRED FOR CPT [phone].

## 2020-06-07 ENCOUNTER — Other Ambulatory Visit: Payer: Self-pay | Admitting: Podiatry

## 2020-06-07 MED ORDER — HYDROCODONE-ACETAMINOPHEN 10-325 MG PO TABS: 1.0000 | ORAL_TABLET | Freq: Four times a day (QID) | ORAL | 0 refills | Status: DC | PRN

## 2020-06-07 MED ORDER — ONDANSETRON HCL 4 MG PO TABS: 4.0000 mg | ORAL_TABLET | Freq: Three times a day (TID) | ORAL | 0 refills | Status: DC | PRN

## 2020-06-07 MED ORDER — CEPHALEXIN 500 MG PO CAPS: 500.0000 mg | ORAL_CAPSULE | Freq: Three times a day (TID) | ORAL | 0 refills | Status: DC

## 2020-06-09 DIAGNOSIS — M722 Plantar fascial fibromatosis: Secondary | ICD-10-CM

## 2020-06-15 ENCOUNTER — Other Ambulatory Visit: Payer: Self-pay

## 2020-06-15 ENCOUNTER — Ambulatory Visit (INDEPENDENT_AMBULATORY_CARE_PROVIDER_SITE_OTHER): Payer: 59 | Admitting: Podiatry

## 2020-06-15 ENCOUNTER — Encounter: Payer: Self-pay | Admitting: Podiatry

## 2020-06-15 DIAGNOSIS — S93691D Other sprain of right foot, subsequent encounter: Secondary | ICD-10-CM

## 2020-06-15 DIAGNOSIS — Z9889 Other specified postprocedural states: Secondary | ICD-10-CM

## 2020-06-15 NOTE — Progress Notes (Signed)
She presents today for her first postop visit date of surgery 06/09/2020 status that post EPF right foot.  States that have been taking any pain medicine I have not had any pain at all other than when I had to run after my mother who had fallen in the living room.  But then the pain went away.  Objective: Signs are stable she is alert and oriented x3.  Pulses are palpable.  Dry sterile dressing was intact was removed demonstrates no erythema there is mild edema no cellulitis drainage or odor sutures are intact medial and lateral portals she has some ecchymosis.  No pain on palpation of the surgical site.  Assessment: Well-healing surgical foot.  Plan: Covered with Band-Aids today placed her in a compression anklet and is to continue the use of her cam walker 24/7.  We will follow-up with her in 1 week for suture removal at which time she will go into her tennis shoe and then continue to use either her cam boot or her night splint for the next month.  I will follow-up with her 2 weeks from next Friday.

## 2020-06-23 ENCOUNTER — Encounter: Payer: Self-pay | Admitting: Podiatry

## 2020-06-23 ENCOUNTER — Ambulatory Visit (INDEPENDENT_AMBULATORY_CARE_PROVIDER_SITE_OTHER): Payer: 59 | Admitting: Podiatry

## 2020-06-23 ENCOUNTER — Other Ambulatory Visit: Payer: Self-pay

## 2020-06-23 DIAGNOSIS — S93691D Other sprain of right foot, subsequent encounter: Secondary | ICD-10-CM

## 2020-06-23 DIAGNOSIS — Z9889 Other specified postprocedural states: Secondary | ICD-10-CM

## 2020-06-24 NOTE — Progress Notes (Signed)
  Subjective:  Patient ID: Stephanie Bishop, female    DOB: [birth year],  MRN: [id number]  Chief Complaint  Patient presents with  . Routine Post Op    i am here to get the stitches out on the right foot     DOS: 06/09/2020 Procedure: Right foot endoscopic plantar fasciotomy  54 y.o. female returns for post-op check.  Here for suture removal  Review of Systems: Negative except as noted in the HPI. Denies N/V/F/Ch.   Objective:  There were no vitals filed for this visit. There is no height or weight on file to calculate BMI. Constitutional Well developed. Well nourished.  Vascular Foot warm and well perfused. Capillary refill normal to all digits.   Neurologic Normal speech. Oriented to person, place, and time. Epicritic sensation to light touch grossly present bilaterally.  Dermatologic Skin healing well without signs of infection. Skin edges well coapted without signs of infection.  Orthopedic: Tenderness to palpation noted about the surgical site.   Assessment:   1. Rupture of plantar fascia of right foot, subsequent encounter   2. S/P foot surgery, right   3. Post-operative state    Plan:  Patient was evaluated and treated and all questions answered.  S/p foot surgery right -Progressing as expected post-operatively. -WB Status: WBAT in CAM boot, she can begin to wean this and return to regular shoe gear as tolerated -Sutures: Removed today. -Medications: N/A -Foot redressed. -Return as scheduled to see Dr. Milinda Pointer for postoperative visit

## 2020-07-06 ENCOUNTER — Encounter: Payer: 59 | Admitting: Podiatry

## 2020-07-20 ENCOUNTER — Other Ambulatory Visit: Payer: Self-pay

## 2020-07-20 ENCOUNTER — Ambulatory Visit (INDEPENDENT_AMBULATORY_CARE_PROVIDER_SITE_OTHER): Payer: 59 | Admitting: Podiatry

## 2020-07-20 DIAGNOSIS — S93691D Other sprain of right foot, subsequent encounter: Secondary | ICD-10-CM

## 2020-07-20 DIAGNOSIS — Z9889 Other specified postprocedural states: Secondary | ICD-10-CM

## 2020-07-20 NOTE — Progress Notes (Signed)
She presents today she is 6 weeks status post endoscopic plantar fasciotomy right foot.  She states is about 80 to 85% improved.  She would like to be able to get back to her walking and exercise.  Objective: Vital signs are stable she is alert oriented x3.  Pulses are palpable.  There is no erythema edema cellulitis drainage or odor she has minimal pain on palpation of the left heel.  Assessment: Resolving surgical foot left.  Plan: Letter back to regular shoes and activities and will follow up with her in 1 month.

## 2020-07-24 ENCOUNTER — Ambulatory Visit (INDEPENDENT_AMBULATORY_CARE_PROVIDER_SITE_OTHER): Payer: 59 | Admitting: Gastroenterology

## 2020-07-24 ENCOUNTER — Encounter (INDEPENDENT_AMBULATORY_CARE_PROVIDER_SITE_OTHER): Payer: Self-pay | Admitting: Gastroenterology

## 2020-07-24 ENCOUNTER — Other Ambulatory Visit: Payer: Self-pay

## 2020-07-24 VITALS — BP 125/70 | HR 99 | Temp 98.0°F | Ht 63.0 in | Wt 165.0 lb

## 2020-07-24 DIAGNOSIS — Z8371 Family history of colonic polyps: Secondary | ICD-10-CM | POA: Diagnosis not present

## 2020-07-24 DIAGNOSIS — K219 Gastro-esophageal reflux disease without esophagitis: Secondary | ICD-10-CM | POA: Diagnosis not present

## 2020-07-24 NOTE — Progress Notes (Signed)
Patient profile: Stephanie Bishop is a 54 y.o. female seen for follow-up. She was last seen August 2020 for nausea vomiting.  History of Present Illness: Stephanie Bishop is seen today for follow-up. She reports overall her symptoms are doing very well.  She takes Protonix once a day most days but occasionally does use twice a day.  She has fairly good control of symptoms as long as she avoids caffeine and spicy foods.  She has not had further episodes of the severe abdominal pain since her visit last year.  She currently has any nausea vomiting.  She takes 2 tums each evening for calcium supplementation.  She is also on vitamin D.  She infrequently takes ibuprofen for foot pain, she did have foot surgery last month.  She is no longer taking Reglan since her last visit.   She is having a bowel movement usually 2-3 times a day.  Occasional rectal bleeding with wiping but this is unchanged recently.  She denies constipation or diarrhea.  No abdominal pain.  Reports her brother had a history of colon polyps.  Wt Readings from Last 3 Encounters:  07/24/20 165 lb (74.8 kg)  05/12/19 145 lb (65.8 kg)  05/03/19 149 lb 6.4 oz (67.8 kg)     Last Colonoscopy: Findings:  07/2012 Prep excellent. Very tortuous sigmoid colon with single diverticulum. No polyps or other mucosal abnormalities noted. Normal rectal mucosa. Small hemorrhoids below the dentate line.    Last Endoscopy: 05/2019-- Normal esophagus. - Z-line irregular, 37 cm from the incisors. - 2 cm hiatal hernia. - Erythematous mucosa in the antrum. Biopsied. - Normal duodenal bulb and second portion of the duodenum. Biopsied.   Past Medical History:  Past Medical History:  Diagnosis Date   GERD (gastroesophageal reflux disease)    PONV (postoperative nausea and vomiting)     Problem List: Patient Active Problem List   Diagnosis Date Noted   Gastroesophageal reflux disease without esophagitis 05/03/2019   Intractable vomiting  05/03/2019   Cyst of breast 01/07/2019   Lipoma of axilla 01/07/2019   Prolapse of female genital organs 01/07/2019   Arthritis of first MTP joint 07/23/2018   Metatarsalgia of right foot 07/23/2018   Cervical myopathy 07/31/2016   Abdominal hernia 04/18/2014   Guaiac positive stools 06/15/2012   Abdominal wall mass 06/15/2012   GERD 10/16/2010   EPIGASTRIC PAIN 10/16/2010    Past Surgical History: Past Surgical History:  Procedure Laterality Date   BIOPSY  05/12/2019   Procedure: BIOPSY;  Surgeon: Rogene Houston, MD;  [facility];  Service: Endoscopy;;  duodenum, antrum   BREAST BIOPSY Left 2012   benign   BREAST CYST ASPIRATION Bilateral    CERVICAL SPINE SURGERY     2001   COLONOSCOPY  07/16/2012   Procedure: COLONOSCOPY;  Surgeon: Rogene Houston, MD;  [facility];  Service: Endoscopy;  Laterality: N/A;  930   ESOPHAGOGASTRODUODENOSCOPY N/A 05/12/2019   Procedure: ESOPHAGOGASTRODUODENOSCOPY (EGD);  Surgeon: Rogene Houston, MD;  [facility];  Service: Endoscopy;  Laterality: N/A;  245    Allergies: Allergies  Allergen Reactions   Prednisone     Gives her migraines   Ciprofloxacin Other (See Comments)    Knots on Face       Home Medications:  Current Outpatient Medications:    cetirizine (ZYRTEC) 10 MG tablet, Take 10 mg by mouth daily as needed for allergies. , Disp: , Rfl:    fluticasone (  FLONASE) 50 MCG/ACT nasal spray, USE TWO SPRAY(S) IN EACH NOSTRIL ONCE DAILY (Patient taking differently: Place 1-2 sprays into both nostrils daily as needed for allergies. ), Disp: 16 g, Rfl: 6   HYDROcodone-acetaminophen (NORCO) 10-325 MG tablet, Take 1 tablet by mouth every 6 (six) hours as needed., Disp: 30 tablet, Rfl: 0   meloxicam (MOBIC) 15 MG tablet, Take 1 tablet (15 mg total) by mouth daily., Disp: 30 tablet, Rfl: 3   neomycin-polymyxin b-dexamethasone (MAXITROL) 3.5-10000-0.1 OINT, APPLY A SMALL AMOUNT OF OINTMENT  ON EYELID TWICE A DAY FOR 7 DAYS, Disp: , Rfl:    ondansetron (ZOFRAN) 4 MG tablet, Take 1 tablet (4 mg total) by mouth every 8 (eight) hours as needed., Disp: 20 tablet, Rfl: 0   pantoprazole (PROTONIX) 40 MG tablet, Take 1 tablet (40 mg total) by mouth 2 (two) times daily before a meal., Disp: 60 tablet, Rfl: 3   Family History: family history is not on file.    Social History:   reports that she has never smoked. She has never used smokeless tobacco. She reports that she does not drink alcohol and does not use drugs.   Review of Systems: Constitutional: Denies weight loss/weight gain  Eyes: No changes in vision. ENT: No oral lesions, sore throat.  GI: see HPI.  Heme/Lymph: No easy bruising.  CV: No chest pain.  GU: No hematuria.  Integumentary: No rashes.  Neuro: No headaches.  Psych: No depression/anxiety.  Endocrine: No heat/cold intolerance.  Allergic/Immunologic: No urticaria.  Resp: No cough, SOB.  Musculoskeletal: No joint swelling.    Physical Examination: BP 125/70 (BP Location: Right Arm, Patient Position: Sitting, Cuff Size: Normal)    Pulse 99    Temp 98 F (36.7 C) (Oral)    Ht 5\' 3"  (1.6 m)    Wt 165 lb (74.8 kg)    LMP 10/11/2018    BMI 29.23 kg/m  Gen: NAD, alert and oriented x 4 HEENT: PEERLA, EOMI, Neck: supple, no JVD Chest: CTA bilaterally, no wheezes, crackles, or other adventitious sounds CV: RRR, no m/g/c/r Abd: soft, NT, ND, +BS in all four quadrants; no HSM, guarding, ridigity, or rebound tenderness Ext: no edema, well perfused with 2+ pulses, Skin: no rash or lesions noted on observed skin Lymph: no noted LAD  Data Reviewed:  GES 06/2019-53.6% emptied at 1 hr ( normal >= 10%) 81.2% emptied at 2 hr ( normal >= 40% 86.2% emptied at 3 hr ( normal >= 70%) 91.3% emptied at 4 hr ( normal >= 90%) IMPRESSION: Normal gastric emptying study   05/2019-IMPRESSION: CT a/p  1. No acute findings are noted in the abdomen or pelvis to account for the  patient's symptoms. 2. Incidental findings, as above.  HIDA Scan - IMPRESSION: 04/2019 Normal ejection fraction of radiotracer from the gallbladder. The patient did experience abdominal pressure sensation with the CCK administration. Cystic and common bile ducts are patent as is evidenced by visualization of gallbladder and small bowel   Assessment/Plan: Ms. Dileo is a 54 y.o. female seen for follow-up.  1.  Epigastric pain-last year in 2020 had severe epigastric pain with nausea vomiting.  She had a HIDA scan that was unremarkable as well as CT scan and a normal gastric emptying scan.  She did a short course of Reglan but has now been off Reglan since last year.  She is currently taking Protonix 1-2 times a day and feels this regimen works very well and will continue.  She denies any upper  GI symptoms.  She is compliant with diet modifications and has found clear foods that do trigger symptoms.  No lower GI symptoms.  Her brother has a history of colon polyps.  We discussed transitioning to a 5-year colonoscopy interval but she will like to continue every 10 years.  She is due 2023 or sooner if prefers.   Diagnoses and all orders for this visit:  Chronic GERD  Family history of colonic polyps      I personally performed the service, non-incident to. (WP)  Laurine Blazer, [facility]

## 2020-07-31 ENCOUNTER — Ambulatory Visit (INDEPENDENT_AMBULATORY_CARE_PROVIDER_SITE_OTHER): Payer: 59 | Admitting: Gastroenterology

## 2020-08-29 ENCOUNTER — Ambulatory Visit (INDEPENDENT_AMBULATORY_CARE_PROVIDER_SITE_OTHER): Payer: 59 | Admitting: Podiatry

## 2020-08-29 ENCOUNTER — Encounter: Payer: Self-pay | Admitting: Podiatry

## 2020-08-29 ENCOUNTER — Other Ambulatory Visit: Payer: Self-pay

## 2020-08-29 DIAGNOSIS — S93691D Other sprain of right foot, subsequent encounter: Secondary | ICD-10-CM

## 2020-08-29 DIAGNOSIS — Z9889 Other specified postprocedural states: Secondary | ICD-10-CM

## 2020-08-29 NOTE — Progress Notes (Signed)
She presents today date of surgery June 09, 2020 status post EPF right states that is 900% that was 90% better than it was prior to surgery I does want my foot back so that I can enjoy my activities and doing things that I want to do.  She states that her soda hurts on the side and here she points to the fifth metatarsal cuboid articulation.  Objective: Vital signs are stable she alert oriented x3.  Pulses are palpable.  She has mild tenderness on palpation of the medial calcaneal tubercle she still retains an obvious fasciotomy she has tenderness on frontal plane range of motion at the cuboid fourth fifth met articulation as well as the calcaneocuboid articulation.  Assessment: Probably starting to develop some lateral compensatory syndrome or some cuboid impingement syndrome.  Status post EPF right x7month.  Plan: At this point I am going to recommend orthotics so we are going to call to try to pre-CERT orthotics with her insurance.  I did [facility]  I will follow-up with her when she is done with physical therapy.

## 2020-09-12 ENCOUNTER — Other Ambulatory Visit: Payer: Self-pay | Admitting: Obstetrics and Gynecology

## 2020-09-12 DIAGNOSIS — Z1231 Encounter for screening mammogram for malignant neoplasm of breast: Secondary | ICD-10-CM

## 2020-09-13 ENCOUNTER — Ambulatory Visit (HOSPITAL_COMMUNITY): Payer: 59 | Attending: Podiatry | Admitting: Physical Therapy

## 2020-09-13 ENCOUNTER — Other Ambulatory Visit: Payer: Self-pay

## 2020-09-13 ENCOUNTER — Encounter (HOSPITAL_COMMUNITY): Payer: Self-pay | Admitting: Physical Therapy

## 2020-09-13 DIAGNOSIS — R2689 Other abnormalities of gait and mobility: Secondary | ICD-10-CM | POA: Diagnosis present

## 2020-09-13 DIAGNOSIS — M25571 Pain in right ankle and joints of right foot: Secondary | ICD-10-CM | POA: Insufficient documentation

## 2020-09-13 NOTE — Therapy (Signed)
[place], [place], [address] Phone: [phone]   Fax:  [phone]  Physical Therapy Evaluation  Patient Details  Name: Stephanie Bishop MRN: [id number] Date of Birth: 04/22/1966 Referring Provider (PT): Tyson Dense DPM    Encounter Date: 09/13/2020   PT End of Session - 09/13/20 1202    Visit Number 1    Number of Visits 8    Date for PT Re-Evaluation 10/13/20    Authorization Type [employer] (No auth)    Authorization - Visit Number 1    Authorization - Number of Visits 30    PT Start Time 1115    PT Stop Time 1200    PT Time Calculation (min) 45 min    Activity Tolerance Patient tolerated treatment well    Behavior During Therapy [facility] for tasks assessed/performed           Past Medical History:  Diagnosis Date  . GERD (gastroesophageal reflux disease)   . PONV (postoperative nausea and vomiting)     Past Surgical History:  Procedure Laterality Date  . BIOPSY  05/12/2019   Procedure: BIOPSY;  Surgeon: Rogene [place], MD;  [facility];  Service: Endoscopy;;  duodenum, antrum  . BREAST BIOPSY Left 2012   benign  . BREAST CYST ASPIRATION Bilateral   . CERVICAL SPINE SURGERY     2001  . COLONOSCOPY  07/16/2012   Procedure: COLONOSCOPY;  Surgeon: Rogene [place], MD;  [facility];  Service: Endoscopy;  Laterality: N/A;  930  . ESOPHAGOGASTRODUODENOSCOPY N/A 05/12/2019   Procedure: ESOPHAGOGASTRODUODENOSCOPY (EGD);  Surgeon: Rogene [place], MD;  [facility];  Service: Endoscopy;  Laterality: N/A;  245    There were no vitals filed for this visit.    Subjective Assessment - 09/13/20 1125    Subjective Patient presents to physical therapy with complaint of RT foot pain. Patient had plantar fasciotomy on 06/09/20. Patient says this has greatly helped her foot pain, but still has remaining pain on medial aspect and lateral border of RT foot. Patient says she can only  walk about 1000 steps before pain stops her. Patient she does feel better with her soft brace and wearing walker boot for a few hours a day. She has been ordered orthotics but has not yet received them. Denies taking any medication for this condition.    Pertinent History RT plantar fasciiotomy on 06/09/20    Limitations Standing;Walking;House hold activities    Patient Stated Goals I want to walk again like I used to    Currently in Pain? Yes    Pain Score 5     Pain Location Foot    Pain Orientation Right;Lateral;Medial    Pain Descriptors / Indicators Aching;Dull    Pain Type Surgical pain    Pain Onset More than a month ago    Pain Frequency Constant    Aggravating Factors  standing, walking    Pain Relieving Factors walker boot, soft brace, non WB    Effect of Pain on Daily Activities Limits              [facility] PT Assessment - 09/13/20 0001      Assessment   Medical Diagnosis RT plantar fasciiotomy     Referring Provider (PT) Max Hyatt DPM     Onset Date/Surgical Date 06/09/20    Prior Therapy Not for foot       Precautions   Precautions  None      Restrictions   Weight Bearing Restrictions No      Balance Screen   Has the patient fallen in the past 6 months No      [place] residence    Living Arrangements Spouse/significant other;Children;Parent      Prior Function   Level of Independence Independent      Cognition   Overall Cognitive Status Within Functional Limits for tasks assessed      Observation/Other Assessments   Focus on Therapeutic Outcomes (FOTO)  24% limited       ROM / Strength   AROM / PROM / Strength AROM;Strength      AROM   AROM Assessment Site Ankle    Right/Left Ankle Right;Left    Right Ankle Dorsiflexion 8    Right Ankle Plantar Flexion 50    Right Ankle Inversion --   WFL   Right Ankle Eversion --   WFL   Left Ankle Dorsiflexion 10    Left Ankle Plantar Flexion 53    Left Ankle Inversion --    WFL   Left Ankle Eversion --   [facility]     Strength   Strength Assessment Site Ankle    Right/Left Ankle Right;Left    Right Ankle Dorsiflexion 5/5    Right Ankle Plantar Flexion 4/5    Right Ankle Inversion 5/5    Right Ankle Eversion 4/5   pain   Left Ankle Dorsiflexion 5/5    Left Ankle Plantar Flexion 4+/5    Left Ankle Inversion 5/5    Left Ankle Eversion 5/5      Flexibility   Soft Tissue Assessment /Muscle Length --   min restriction in bilateral calf flexibility      Palpation   Palpation comment Mod tenderness to palpation about mid plantar aspect of RT foot, also posterior to RT lateral malleolus       Ambulation/Gait   Ambulation/Gait Yes    Ambulation/Gait Assistance 7: Independent    Assistive device None    Gait Pattern Decreased dorsiflexion - right;Decreased dorsiflexion - left;Decreased step length - right;Decreased step length - left    Ambulation Surface Level;Indoor      Balance   Balance Assessed Yes      Static Standing Balance   Static Standing Balance -  Activities  Single Leg Stance - Right Leg;Single Leg Stance - Left Leg    Static Standing - Comment/# of Minutes 9 sec, 12 sec                       Objective measurements completed on examination: See above findings.       [place] Adult PT Treatment/Exercise - 09/13/20 0001      Exercises   Exercises Ankle      Ankle Exercises: Stretches   Gastroc Stretch 3 reps;30 seconds    Gastroc Stretch Limitations with strap/ towel       Ankle Exercises: Seated   Towel Crunch 5 reps   RT foot      Ankle Exercises: Supine   T-Band GTB ankle 4 way x 10 each                   PT Education - 09/13/20 1128    Education Details on evaluation findings, POC and HEP    Person(s) Educated Patient    Methods Explanation;Handout    Comprehension Verbalized understanding  PT Short Term Goals - 09/13/20 1214      PT SHORT TERM GOAL #1   Title Patient will be independent  with initial HEP and self-management strategies to improve functional outcomes    Time 2    Period Weeks    Status New    Target Date 09/29/20             PT Long Term Goals - 09/13/20 1214      PT LONG TERM GOAL #1   Title Patient will be independent with final HEP and self-management strategies to improve functional outcomes    Time 4    Period Weeks    Status New    Target Date 10/13/20      PT LONG TERM GOAL #2   Title Patient will improve FOTO score by at least 5% to indicate improvement in functional outcomes    Time 4    Period Weeks    Status New    Target Date 10/13/20      PT LONG TERM GOAL #3   Title Patient will report at least 70% overall improvement in subjective complaint to indicate improvement in ability to perform ADLs.    Time 4    Period Weeks    Status New    Target Date 10/13/20      PT LONG TERM GOAL #4   Title Patient will report being able to ambulate at least 30 minutes to demonstrate improved ability to perform functional mobility and associated tasks.    Time 4    Period Weeks    Status New    Target Date 10/13/20      PT LONG TERM GOAL #5   Title Patient will be able to perform single leg stand for at least 20 seconds bilaterally to demonstrate improved ankle stability and balance for improved functional mobility.    Time 4    Period Weeks    Status New    Target Date 10/13/20                  Plan - 09/13/20 1209    Clinical Impression Statement Patient is a 54 y.o. female who presents to physical therapy with complaint of RT foot pain s/p RT plantar fasciotomy on 06/09/20. Patient demonstrates decreased functional strength, ROM restriction, increased tenderness to palpation and gait abnormalities which are likely contributing to symptoms of pain and are negatively impacting patient ability to perform ADLs and functional mobility tasks. Patient will benefit from skilled physical therapy services to address these deficits to reduce  pain and improve level of function with ADLs and functional mobility tasks.    Examination-Activity Limitations Transfers;Stairs;Stand;Locomotion Level    Examination-Participation Restrictions Yard Work;Cleaning;Community Activity    Stability/Clinical Decision Making Stable/Uncomplicated    Clinical Decision Making Low    Rehab Potential Good    PT Frequency 2x / week    PT Duration 4 weeks    PT Treatment/Interventions ADLs/Self Care Home Management;Aquatic Therapy;Biofeedback;Cryotherapy;Electrical Stimulation;Iontophoresis 4mg /ml Dexamethasone;Moist Heat;Traction;Balance training;Manual lymph drainage;Manual techniques;Therapeutic exercise;Therapeutic activities;Vasopneumatic Device;Taping;Splinting;Functional mobility training;Stair training;Gait training;Orthotic Fit/Training;Patient/family education;DME Instruction;Contrast Bath;Fluidtherapy;Parrafin;Ultrasound;Neuromuscular re-education;Compression bandaging;Visual/perceptual remediation/compensation;Scar mobilization;Passive range of motion;Spinal Manipulations;Dry needling;Joint Manipulations    PT Next Visit Plan Review goals and HEP. Progrress ankle stabilization and DF ROM, progress to balance, gait and proprioception as able. Manual as needed. Trial IASTM next session    PT Home Exercise Plan ankle band 4 way, towel scrunch, seated calf stretch    Consulted and Agree with Plan of Care  Patient           Patient will benefit from skilled therapeutic intervention in order to improve the following deficits and impairments:  Abnormal gait, Hypomobility, Decreased activity tolerance, Decreased strength, Increased fascial restricitons, Pain, Decreased scar mobility, Decreased balance, Decreased mobility, Difficulty walking, Decreased range of motion, Impaired flexibility, Improper body mechanics  Visit Diagnosis: Pain in right ankle and joints of right foot  Other abnormalities of gait and mobility     Problem List Patient Active  Problem List   Diagnosis Date Noted  . Gastroesophageal reflux disease without esophagitis 05/03/2019  . Intractable vomiting 05/03/2019  . Cyst of breast 01/07/2019  . Lipoma of axilla 01/07/2019  . Prolapse of female genital organs 01/07/2019  . Arthritis of first MTP joint 07/23/2018  . Metatarsalgia of right foot 07/23/2018  . Cervical myopathy 07/31/2016  . Abdominal hernia 04/18/2014  . Guaiac positive stools 06/15/2012  . Abdominal wall mass 06/15/2012  . GERD 10/16/2010  . EPIGASTRIC PAIN 10/16/2010    4:38 PM, 09/13/20 Josue Hector PT DPT  Physical Therapist with [facility]  [phone] [place] [address] [place], [place], [address] Phone: [phone]   Fax:  [phone]  Name: Stephanie Bishop MRN: [id number] Date of Birth: [birth year]

## 2020-09-13 NOTE — Patient Instructions (Signed)
Access Code: [email or web address] URL: [email or web address] Date: 09/13/2020 Prepared by: Josue Hector  Exercises Ankle Dorsiflexion with Resistance - 2-3 x daily - 7 x weekly - 2 sets - 10 reps Ankle and Toe Plantarflexion with Resistance - 2-3 x daily - 7 x weekly - 2 sets - 10 reps Ankle Inversion with Resistance - 2-3 x daily - 7 x weekly - 2 sets - 10 reps Ankle Eversion with Resistance - 2-3 x daily - 7 x weekly - 2 sets - 10 reps Seated Calf Stretch with Strap - 2-3 x daily - 7 x weekly - 1 sets - 3 reps - 30 seconds hold Seated Toe Towel Scrunches - 2-3 x daily - 7 x weekly - 1 sets - 10 reps

## 2020-09-18 ENCOUNTER — Other Ambulatory Visit: Payer: Self-pay

## 2020-09-18 ENCOUNTER — Ambulatory Visit (HOSPITAL_COMMUNITY): Payer: 59 | Admitting: Physical Therapy

## 2020-09-18 ENCOUNTER — Encounter (HOSPITAL_COMMUNITY): Payer: Self-pay | Admitting: Physical Therapy

## 2020-09-18 DIAGNOSIS — M25571 Pain in right ankle and joints of right foot: Secondary | ICD-10-CM

## 2020-09-18 DIAGNOSIS — R2689 Other abnormalities of gait and mobility: Secondary | ICD-10-CM

## 2020-09-18 NOTE — Therapy (Signed)
[place], [place], [address] Phone: [phone]   Fax:  [phone]  Physical Therapy Treatment  Patient Details  Name: Stephanie Bishop MRN: [id number] Date of Birth: Oct 15, 1966 Referring Provider (PT): Tyson Dense DPM    Encounter Date: 09/18/2020   PT End of Session - 09/18/20 1354    Visit Number 2    Number of Visits 8    Date for PT Re-Evaluation 10/13/20    Authorization Type [employer] (No auth)    Authorization - Visit Number 2    Authorization - Number of Visits 30    PT Start Time 0630    PT Stop Time 1435    PT Time Calculation (min) 48 min    Activity Tolerance Patient tolerated treatment well    Behavior During Therapy [facility] for tasks assessed/performed           Past Medical History:  Diagnosis Date  . GERD (gastroesophageal reflux disease)   . PONV (postoperative nausea and vomiting)     Past Surgical History:  Procedure Laterality Date  . BIOPSY  05/12/2019   Procedure: BIOPSY;  Surgeon: Rogene [place], MD;  [facility];  Service: Endoscopy;;  duodenum, antrum  . BREAST BIOPSY Left 2012   benign  . BREAST CYST ASPIRATION Bilateral   . CERVICAL SPINE SURGERY     2001  . COLONOSCOPY  07/16/2012   Procedure: COLONOSCOPY;  Surgeon: Rogene [place], MD;  [facility];  Service: Endoscopy;  Laterality: N/A;  930  . ESOPHAGOGASTRODUODENOSCOPY N/A 05/12/2019   Procedure: ESOPHAGOGASTRODUODENOSCOPY (EGD);  Surgeon: Rogene [place], MD;  [facility];  Service: Endoscopy;  Laterality: N/A;  245    There were no vitals filed for this visit.   Subjective Assessment - 09/18/20 1352    Subjective Patient says her exercises are good. Says they helped, but didn't last long. Says she went shopping over the weekend, says she did a lot of walking and was almost in tears. A lot of pain. Says she doesn't understand it. Says she put her walker boot on and felt better.      Pertinent History RT plantar fasciiotomy on 06/09/20    Limitations Standing;Walking;House hold activities    Patient Stated Goals I want to walk again like I used to    Currently in Pain? Yes    Pain Score 3     Pain Location Foot    Pain Orientation Right;Medial    Pain Descriptors / Indicators Aching;Dull    Pain Onset More than a month ago    Pain Frequency Intermittent                             [facility] Adult PT Treatment/Exercise - 09/18/20 0001      Manual Therapy   Manual Therapy Soft tissue mobilization    Manual therapy comments Manual complete separate from all other activity     Soft tissue mobilization IASTM to RT plantar surface, patient in prone       Ankle Exercises: Stretches   Gastroc Stretch 3 reps;30 seconds    Gastroc Stretch Limitations with strap/ towel       Ankle Exercises: Supine   T-Band GTB ankle 4 way x 20 each       Ankle Exercises: Seated   Towel Crunch 5 reps    BAPS Sitting;Level 2;15 reps  DF/PF; INV/EV; CW/CCW      Ankle Exercises: Standing   Rocker Board 2 minutes   DF/PF; INV/EV    Other Standing Ankle Exercises tandem stance on foam 3 x 30"                   PT Education - 09/18/20 1534    Education Details on therapy goals, HEP, exercise and technique    Person(s) Educated Patient    Methods Explanation    Comprehension Verbalized understanding            PT Short Term Goals - 09/13/20 1214      PT SHORT TERM GOAL #1   Title Patient will be independent with initial HEP and self-management strategies to improve functional outcomes    Time 2    Period Weeks    Status New    Target Date 09/29/20             PT Long Term Goals - 09/13/20 1214      PT LONG TERM GOAL #1   Title Patient will be independent with final HEP and self-management strategies to improve functional outcomes    Time 4    Period Weeks    Status New    Target Date 10/13/20      PT LONG TERM GOAL #2   Title Patient  will improve FOTO score by at least 5% to indicate improvement in functional outcomes    Time 4    Period Weeks    Status New    Target Date 10/13/20      PT LONG TERM GOAL #3   Title Patient will report at least 70% overall improvement in subjective complaint to indicate improvement in ability to perform ADLs.    Time 4    Period Weeks    Status New    Target Date 10/13/20      PT LONG TERM GOAL #4   Title Patient will report being able to ambulate at least 30 minutes to demonstrate improved ability to perform functional mobility and associated tasks.    Time 4    Period Weeks    Status New    Target Date 10/13/20      PT LONG TERM GOAL #5   Title Patient will be able to perform single leg stand for at least 20 seconds bilaterally to demonstrate improved ankle stability and balance for improved functional mobility.    Time 4    Period Weeks    Status New    Target Date 10/13/20                 Plan - 09/18/20 1535    Clinical Impression Statement Patient tolerated session well today. Reviewed goals and HEP, initiated ther ex. Ther ex focused on ankle control and stabilization. Patient educated on proper form and function of all added activity. Patient was slightly challenged with tandem stance on foam and required verbal cues for foot placement. Added IASTM to RT plantar surface to address restrictions. Will assess response next visit.    Examination-Activity Limitations Transfers;Stairs;Stand;Locomotion Level    Examination-Participation Restrictions Yard Work;Cleaning;Community Activity    Stability/Clinical Decision Making Stable/Uncomplicated    Rehab Potential Good    PT Frequency 2x / week    PT Duration 4 weeks    PT Treatment/Interventions ADLs/Self Care Home Management;Aquatic Therapy;Biofeedback;Cryotherapy;Electrical Stimulation;Iontophoresis 4mg /ml Dexamethasone;Moist Heat;Traction;Balance training;Manual lymph drainage;Manual techniques;Therapeutic  exercise;Therapeutic activities;Vasopneumatic Device;Taping;Splinting;Functional mobility training;Stair training;Gait training;Orthotic Fit/Training;Patient/family education;DME Instruction;Contrast Bath;Fluidtherapy;Parrafin;Ultrasound;Neuromuscular re-education;Compression  bandaging;Visual/perceptual remediation/compensation;Scar mobilization;Passive range of motion;Spinal Manipulations;Dry needling;Joint Manipulations    PT Next Visit Plan Assess response to manual. Progress ankle stabilization and DF ROM, progress to balance, gait and proprioception as able.    PT Home Exercise Plan ankle band 4 way, towel scrunch, seated calf stretch 11/15: tandem stance    Consulted and Agree with Plan of Care Patient           Patient will benefit from skilled therapeutic intervention in order to improve the following deficits and impairments:  Abnormal gait, Hypomobility, Decreased activity tolerance, Decreased strength, Increased fascial restricitons, Pain, Decreased scar mobility, Decreased balance, Decreased mobility, Difficulty walking, Decreased range of motion, Impaired flexibility, Improper body mechanics  Visit Diagnosis: Pain in right ankle and joints of right foot  Other abnormalities of gait and mobility     Problem List Patient Active Problem List   Diagnosis Date Noted  . Gastroesophageal reflux disease without esophagitis 05/03/2019  . Intractable vomiting 05/03/2019  . Cyst of breast 01/07/2019  . Lipoma of axilla 01/07/2019  . Prolapse of female genital organs 01/07/2019  . Arthritis of first MTP joint 07/23/2018  . Metatarsalgia of right foot 07/23/2018  . Cervical myopathy 07/31/2016  . Abdominal hernia 04/18/2014  . Guaiac positive stools 06/15/2012  . Abdominal wall mass 06/15/2012  . GERD 10/16/2010  . EPIGASTRIC PAIN 10/16/2010    3:45 PM, 09/18/20 Josue Hector PT DPT  Physical Therapist with [facility]  [phone] [place] [address] [place], [place], [address] Phone: [phone]   Fax:  [phone]  Name: Stephanie Bishop MRN: [id number] Date of Birth: [birth year]

## 2020-09-20 ENCOUNTER — Encounter (HOSPITAL_COMMUNITY): Payer: Self-pay | Admitting: Physical Therapy

## 2020-09-20 ENCOUNTER — Other Ambulatory Visit: Payer: Self-pay

## 2020-09-20 ENCOUNTER — Ambulatory Visit (HOSPITAL_COMMUNITY): Payer: 59 | Admitting: Physical Therapy

## 2020-09-20 DIAGNOSIS — M25571 Pain in right ankle and joints of right foot: Secondary | ICD-10-CM

## 2020-09-20 DIAGNOSIS — R2689 Other abnormalities of gait and mobility: Secondary | ICD-10-CM

## 2020-09-20 NOTE — Therapy (Signed)
[place], [place], [address] Phone: [phone]   Fax:  [phone]  Physical Therapy Treatment  Patient Details  Name: Stephanie Bishop MRN: [id number] Date of Birth: Jul 14, 1966 Referring Provider (PT): Tyson Dense DPM    Encounter Date: 09/20/2020   PT End of Session - 09/20/20 1352    Visit Number 3    Number of Visits 8    Date for PT Re-Evaluation 10/13/20    Authorization Type [employer] (No auth)    Authorization - Visit Number 3    Authorization - Number of Visits 30    PT Start Time 1346    PT Stop Time 1430    PT Time Calculation (min) 44 min    Activity Tolerance Patient tolerated treatment well    Behavior During Therapy [facility] for tasks assessed/performed           Past Medical History:  Diagnosis Date  . GERD (gastroesophageal reflux disease)   . PONV (postoperative nausea and vomiting)     Past Surgical History:  Procedure Laterality Date  . BIOPSY  05/12/2019   Procedure: BIOPSY;  Surgeon: Rogene Houston, MD;  [facility];  Service: Endoscopy;;  duodenum, antrum  . BREAST BIOPSY Left 2012   benign  . BREAST CYST ASPIRATION Bilateral   . CERVICAL SPINE SURGERY     2001  . COLONOSCOPY  07/16/2012   Procedure: COLONOSCOPY;  Surgeon: Rogene Houston, MD;  [facility];  Service: Endoscopy;  Laterality: N/A;  930  . ESOPHAGOGASTRODUODENOSCOPY N/A 05/12/2019   Procedure: ESOPHAGOGASTRODUODENOSCOPY (EGD);  Surgeon: Rogene Houston, MD;  [facility];  Service: Endoscopy;  Laterality: N/A;  245    There were no vitals filed for this visit.   Subjective Assessment - 09/20/20 1350    Subjective Patient says she is having a rough day. Says she did some walking in a store. Foot hurts. Sasy she felt good after last visit but foot was hurting bad later that night.    Pertinent History RT plantar fasciiotomy on 06/09/20    Limitations Standing;Walking;House hold  activities    Patient Stated Goals I want to walk again like I used to    Currently in Pain? Yes    Pain Score 4     Pain Location Foot    Pain Orientation Right;Medial    Pain Descriptors / Indicators Aching;Throbbing    Pain Type Surgical pain    Pain Onset More than a month ago    Pain Frequency Intermittent                             [facility] Adult PT Treatment/Exercise - 09/20/20 0001      Ankle Exercises: Supine   T-Band BTB ankle 4 way x 20 each       Ankle Exercises: Seated   BAPS Sitting;Level 3;15 reps   PF/DF,INV/EV,CW/CCW     Ankle Exercises: Standing   Other Standing Ankle Exercises tandem stance on foam 2 x 30"     Other Standing Ankle Exercises gait training 2 RT in clinic, cues for arm swing       Ankle Exercises: Stretches   Gastroc Stretch 3 reps;30 seconds    Gastroc Stretch Limitations at wall                   PT Education - 09/20/20 1639  Education Details on modified activities, pain saclae, what is normal soreness vs what is pain, time frame for symptom improvment with therapy. Updated HEP    Person(s) Educated Patient    Methods Explanation    Comprehension Verbalized understanding            PT Short Term Goals - 09/13/20 1214      PT SHORT TERM GOAL #1   Title Patient will be independent with initial HEP and self-management strategies to improve functional outcomes    Time 2    Period Weeks    Status New    Target Date 09/29/20             PT Long Term Goals - 09/13/20 1214      PT LONG TERM GOAL #1   Title Patient will be independent with final HEP and self-management strategies to improve functional outcomes    Time 4    Period Weeks    Status New    Target Date 10/13/20      PT LONG TERM GOAL #2   Title Patient will improve FOTO score by at least 5% to indicate improvement in functional outcomes    Time 4    Period Weeks    Status New    Target Date 10/13/20      PT LONG TERM GOAL #3    Title Patient will report at least 70% overall improvement in subjective complaint to indicate improvement in ability to perform ADLs.    Time 4    Period Weeks    Status New    Target Date 10/13/20      PT LONG TERM GOAL #4   Title Patient will report being able to ambulate at least 30 minutes to demonstrate improved ability to perform functional mobility and associated tasks.    Time 4    Period Weeks    Status New    Target Date 10/13/20      PT LONG TERM GOAL #5   Title Patient will be able to perform single leg stand for at least 20 seconds bilaterally to demonstrate improved ankle stability and balance for improved functional mobility.    Time 4    Period Weeks    Status New    Target Date 10/13/20                 Plan - 09/20/20 1431    Clinical Impression Statement Patient tolerated session well overall today. Discussed with patient expected pain level vs muscle soreness and therapy POC in regard to reasonable time frame to expect significant improvement with symptoms. Discussed performing HEP within tolerance range and to avoid excess activity leading to pain. Educated patient on improved symptoms awareness to better pin point what may be causing the delayed pain patient has described. Reaffirmed that todays activity was not painful while being performed. Educated patient on HEP modifications and progressed band resistance of ankle band 4 way.    Examination-Activity Limitations Transfers;Stairs;Stand;Locomotion Level    Examination-Participation Restrictions Yard Work;Cleaning;Community Activity    Stability/Clinical Decision Making Stable/Uncomplicated    Rehab Potential Good    PT Frequency 2x / week    PT Duration 4 weeks    PT Treatment/Interventions ADLs/Self Care Home Management;Aquatic Therapy;Biofeedback;Cryotherapy;Electrical Stimulation;Iontophoresis 4mg /ml Dexamethasone;Moist Heat;Traction;Balance training;Manual lymph drainage;Manual techniques;Therapeutic  exercise;Therapeutic activities;Vasopneumatic Device;Taping;Splinting;Functional mobility training;Stair training;Gait training;Orthotic Fit/Training;Patient/family education;DME Instruction;Contrast Bath;Fluidtherapy;Parrafin;Ultrasound;Neuromuscular re-education;Compression bandaging;Visual/perceptual remediation/compensation;Scar mobilization;Passive range of motion;Spinal Manipulations;Dry needling;Joint Manipulations    PT Next Visit Plan Progress ankle stabilization  and DF ROM, progress to balance, gait and proprioception as able.    PT Home Exercise Plan ankle band 4 way, towel scrunch, seated calf stretch 11/15: tandem stance 09/20/20: tandem satnce on pillow, BTB for ankle 4 way and calf stretch standing at wall    Consulted and Agree with Plan of Care Patient           Patient will benefit from skilled therapeutic intervention in order to improve the following deficits and impairments:  Abnormal gait, Hypomobility, Decreased activity tolerance, Decreased strength, Increased fascial restricitons, Pain, Decreased scar mobility, Decreased balance, Decreased mobility, Difficulty walking, Decreased range of motion, Impaired flexibility, Improper body mechanics  Visit Diagnosis: Pain in right ankle and joints of right foot  Other abnormalities of gait and mobility     Problem List Patient Active Problem List   Diagnosis Date Noted  . Gastroesophageal reflux disease without esophagitis 05/03/2019  . Intractable vomiting 05/03/2019  . Cyst of breast 01/07/2019  . Lipoma of axilla 01/07/2019  . Prolapse of female genital organs 01/07/2019  . Arthritis of first MTP joint 07/23/2018  . Metatarsalgia of right foot 07/23/2018  . Cervical myopathy 07/31/2016  . Abdominal hernia 04/18/2014  . Guaiac positive stools 06/15/2012  . Abdominal wall mass 06/15/2012  . GERD 10/16/2010  . EPIGASTRIC PAIN 10/16/2010   4:46 PM, 09/20/20 Josue Hector PT DPT  Physical Therapist with [facility]  [phone] [employer] [address] [place], [place], [address] Phone: [phone]   Fax:  [phone]  Name: Stephanie Bishop MRN: [id number] Date of Birth: [birth year]

## 2020-09-22 ENCOUNTER — Telehealth: Payer: Self-pay | Admitting: Podiatry

## 2020-09-22 NOTE — Telephone Encounter (Signed)
Left message for pt to call to discuss orthotic coverage. Not covered per faxed I received from [employer]

## 2020-09-25 NOTE — Telephone Encounter (Signed)
Pt returned my call and I explained that her insurance company denied the authorization for the orthotics. She asked how much they cost and I told her 438.00 but we do have a payment plan we can offer with minimal of 100.00 down. She said she is still currently in physical therapy and will see how it goes and will call if she needed to proceed.

## 2020-09-26 ENCOUNTER — Other Ambulatory Visit: Payer: Self-pay

## 2020-09-26 ENCOUNTER — Encounter (HOSPITAL_COMMUNITY): Payer: Self-pay | Admitting: Physical Therapy

## 2020-09-26 ENCOUNTER — Ambulatory Visit (HOSPITAL_COMMUNITY): Payer: 59 | Admitting: Physical Therapy

## 2020-09-26 DIAGNOSIS — M25571 Pain in right ankle and joints of right foot: Secondary | ICD-10-CM | POA: Diagnosis not present

## 2020-09-26 DIAGNOSIS — R2689 Other abnormalities of gait and mobility: Secondary | ICD-10-CM

## 2020-09-26 NOTE — Therapy (Signed)
[place], [place], [address] Phone: [phone]   Fax:  [phone]  Physical Therapy Treatment  Patient Details  Name: Stephanie Bishop MRN: [id number] Date of Birth: 07/04/1966 Referring Provider (PT): Tyson Dense DPM    Encounter Date: 09/26/2020   PT End of Session - 09/26/20 1042    Visit Number 4    Number of Visits 8    Date for PT Re-Evaluation 10/13/20    Authorization Type [employer] (No auth)    Authorization - Visit Number 4    Authorization - Number of Visits 30    PT Start Time 7482    PT Stop Time 1116    PT Time Calculation (min) 38 min    Activity Tolerance Patient tolerated treatment well    Behavior During Therapy [facility] for tasks assessed/performed           Past Medical History:  Diagnosis Date  . GERD (gastroesophageal reflux disease)   . PONV (postoperative nausea and vomiting)     Past Surgical History:  Procedure Laterality Date  . BIOPSY  05/12/2019   Procedure: BIOPSY;  Surgeon: Rogene Houston, MD;  [facility];  Service: Endoscopy;;  duodenum, antrum  . BREAST BIOPSY Left 2012   benign  . BREAST CYST ASPIRATION Bilateral   . CERVICAL SPINE SURGERY     2001  . COLONOSCOPY  07/16/2012   Procedure: COLONOSCOPY;  Surgeon: Rogene Houston, MD;  [facility];  Service: Endoscopy;  Laterality: N/A;  930  . ESOPHAGOGASTRODUODENOSCOPY N/A 05/12/2019   Procedure: ESOPHAGOGASTRODUODENOSCOPY (EGD);  Surgeon: Rogene Houston, MD;  [facility];  Service: Endoscopy;  Laterality: N/A;  245    There were no vitals filed for this visit.   Subjective Assessment - 09/26/20 1041    Subjective Patient says she has been doing HEP. Says she went for walk on Saturday (maybe half a mile) says she was crawling by the time she got home. This discourages her. Has been standing today, not bothering her as much.    Pertinent History RT plantar fasciiotomy on 06/09/20      Limitations Standing;Walking;House hold activities    Patient Stated Goals I want to walk again like I used to    Currently in Pain? Yes    Pain Score 2     Pain Location Foot    Pain Orientation Right;Medial    Pain Descriptors / Indicators Aching    Pain Type Surgical pain    Pain Onset More than a month ago    Pain Frequency Intermittent                             [facility] Adult PT Treatment/Exercise - 09/26/20 0001      Exercises   Exercises Knee/Hip      Knee/Hip Exercises: Standing   Lateral Step Up Right;1 set;15 reps;Hand Hold: 1;Step Height: 4"    Forward Step Up Right;1 set;15 reps;Hand Hold: 1;Step Height: 4"    Step Down Right;1 set;15 reps;Hand Hold: 1;Step Height: 4"    Functional Squat 2 sets;10 reps      Manual Therapy   Manual Therapy Taping    Manual therapy comments Manual complete separate from all other activity     Kinesiotex Ligament Correction   K tape for navicular arch drop on RT      Ankle Exercises: Standing  SLS 3 x 15" on solid floor     Rocker Board 2 minutes   DF/PF; INV/EV    Other Standing Ankle Exercises tandem stance on foam 2 x 30"     Other Standing Ankle Exercises gait training 3 RT in clinic      Ankle Exercises: Stretches   Gastroc Stretch 3 reps;30 seconds    Gastroc Stretch Limitations slant board                    PT Short Term Goals - 09/13/20 1214      PT SHORT TERM GOAL #1   Title Patient will be independent with initial HEP and self-management strategies to improve functional outcomes    Time 2    Period Weeks    Status New    Target Date 09/29/20             PT Long Term Goals - 09/13/20 1214      PT LONG TERM GOAL #1   Title Patient will be independent with final HEP and self-management strategies to improve functional outcomes    Time 4    Period Weeks    Status New    Target Date 10/13/20      PT LONG TERM GOAL #2   Title Patient will improve FOTO score by at least 5% to  indicate improvement in functional outcomes    Time 4    Period Weeks    Status New    Target Date 10/13/20      PT LONG TERM GOAL #3   Title Patient will report at least 70% overall improvement in subjective complaint to indicate improvement in ability to perform ADLs.    Time 4    Period Weeks    Status New    Target Date 10/13/20      PT LONG TERM GOAL #4   Title Patient will report being able to ambulate at least 30 minutes to demonstrate improved ability to perform functional mobility and associated tasks.    Time 4    Period Weeks    Status New    Target Date 10/13/20      PT LONG TERM GOAL #5   Title Patient will be able to perform single leg stand for at least 20 seconds bilaterally to demonstrate improved ankle stability and balance for improved functional mobility.    Time 4    Period Weeks    Status New    Target Date 10/13/20                 Plan - 09/26/20 1324    Clinical Impression Statement Patient continues to demo altered mechanics about RT foot in WB. Added single leg stands for increased ankle stability and balance. Patient was well challenged with this. Also added functional squats, patient with slight RLE valgus but was able to correct with verbal cues. Patient also with RT foot arch drop during squatting. Trialed K taping for RT floor arch support. Patient showed some improvement in mechanics with retest of single leg balance and functional squatting afterward. Will assess response at next visit.    Examination-Activity Limitations Transfers;Stairs;Stand;Locomotion Level    Examination-Participation Restrictions Yard Work;Cleaning;Community Activity    Stability/Clinical Decision Making Stable/Uncomplicated    Rehab Potential Good    PT Frequency 2x / week    PT Duration 4 weeks    PT Treatment/Interventions ADLs/Self Care Home Management;Aquatic Therapy;Biofeedback;Cryotherapy;Electrical Stimulation;Iontophoresis 4mg /ml Dexamethasone;Moist  Heat;Traction;Balance training;Manual lymph drainage;Manual  techniques;Therapeutic exercise;Therapeutic activities;Vasopneumatic Device;Taping;Splinting;Functional mobility training;Stair training;Gait training;Orthotic Fit/Training;Patient/family education;DME Instruction;Contrast Bath;Fluidtherapy;Parrafin;Ultrasound;Neuromuscular re-education;Compression bandaging;Visual/perceptual remediation/compensation;Scar mobilization;Passive range of motion;Spinal Manipulations;Dry needling;Joint Manipulations    PT Next Visit Plan Assess response to K tape. Progress ankle stabilization and DF ROM, progress to balance, gait and proprioception as able.    PT Home Exercise Plan ankle band 4 way, towel scrunch, seated calf stretch 11/15: tandem stance 09/20/20: tandem satnce on pillow, BTB for ankle 4 way and calf stretch standing at wall    Consulted and Agree with Plan of Care Patient           Patient will benefit from skilled therapeutic intervention in order to improve the following deficits and impairments:  Abnormal gait, Hypomobility, Decreased activity tolerance, Decreased strength, Increased fascial restricitons, Pain, Decreased scar mobility, Decreased balance, Decreased mobility, Difficulty walking, Decreased range of motion, Impaired flexibility, Improper body mechanics  Visit Diagnosis: Pain in right ankle and joints of right foot  Other abnormalities of gait and mobility     Problem List Patient Active Problem List   Diagnosis Date Noted  . Gastroesophageal reflux disease without esophagitis 05/03/2019  . Intractable vomiting 05/03/2019  . Cyst of breast 01/07/2019  . Lipoma of axilla 01/07/2019  . Prolapse of female genital organs 01/07/2019  . Arthritis of first MTP joint 07/23/2018  . Metatarsalgia of right foot 07/23/2018  . Cervical myopathy 07/31/2016  . Abdominal hernia 04/18/2014  . Guaiac positive stools 06/15/2012  . Abdominal wall mass 06/15/2012  . GERD 10/16/2010    . EPIGASTRIC PAIN 10/16/2010    1:30 PM, 09/26/20 Josue Hector PT DPT  Physical Therapist with [facility]  [phone] [place] [address] [place], [place], [address] Phone: [phone]   Fax:  [phone]  Name: Stephanie Bishop MRN: [id number] Date of Birth: Aug 22, 1966

## 2020-10-03 ENCOUNTER — Encounter (HOSPITAL_COMMUNITY): Payer: Self-pay | Admitting: Physical Therapy

## 2020-10-03 ENCOUNTER — Other Ambulatory Visit: Payer: Self-pay

## 2020-10-03 ENCOUNTER — Ambulatory Visit (HOSPITAL_COMMUNITY): Payer: 59 | Admitting: Physical Therapy

## 2020-10-03 DIAGNOSIS — M25571 Pain in right ankle and joints of right foot: Secondary | ICD-10-CM | POA: Diagnosis not present

## 2020-10-03 DIAGNOSIS — R2689 Other abnormalities of gait and mobility: Secondary | ICD-10-CM

## 2020-10-03 NOTE — Therapy (Signed)
[place], [place], [address] Phone: [phone]   Fax:  [phone]  Physical Therapy Treatment  Patient Details  Name: Stephanie Bishop MRN: [id number] Date of Birth: 01-08-1966 Referring Provider (PT): Tyson Dense DPM    Encounter Date: 10/03/2020   PT End of Session - 10/03/20 1310    Visit Number 5    Number of Visits 8    Date for PT Re-Evaluation 10/13/20    Authorization Type [employer] (No auth)    Authorization - Visit Number 5    Authorization - Number of Visits 30    PT Start Time 2863    PT Stop Time 1343    PT Time Calculation (min) 38 min    Activity Tolerance Patient tolerated treatment well    Behavior During Therapy [facility] for tasks assessed/performed           Past Medical History:  Diagnosis Date  . GERD (gastroesophageal reflux disease)   . PONV (postoperative nausea and vomiting)     Past Surgical History:  Procedure Laterality Date  . BIOPSY  05/12/2019   Procedure: BIOPSY;  Surgeon: Rogene Houston, MD;  [facility];  Service: Endoscopy;;  duodenum, antrum  . BREAST BIOPSY Left 2012   benign  . BREAST CYST ASPIRATION Bilateral   . CERVICAL SPINE SURGERY     2001  . COLONOSCOPY  07/16/2012   Procedure: COLONOSCOPY;  Surgeon: Rogene Houston, MD;  [facility];  Service: Endoscopy;  Laterality: N/A;  930  . ESOPHAGOGASTRODUODENOSCOPY N/A 05/12/2019   Procedure: ESOPHAGOGASTRODUODENOSCOPY (EGD);  Surgeon: Rogene Houston, MD;  [facility];  Service: Endoscopy;  Laterality: N/A;  245    There were no vitals filed for this visit.   Subjective Assessment - 10/03/20 1309    Subjective Patient says tape helped. Was able to walk more with no pain. Says her thighs were very sore form squats. Says the tape came off by Saturday and foot was very sore to walk that day. Feels that it was helpful.    Pertinent History RT plantar fasciiotomy on 06/09/20     Limitations Standing;Walking;House hold activities    Patient Stated Goals I want to walk again like I used to    Currently in Pain? No/denies    Pain Onset More than a month ago                             [facility] Adult PT Treatment/Exercise - 10/03/20 0001      Knee/Hip Exercises: Standing   Lateral Step Up Right;1 set;15 reps;Step Height: 6"    Forward Step Up Right;1 set;15 reps;Step Height: 6"    Step Down Right;1 set;15 reps;Step Height: 6"    Functional Squat 1 set;15 reps   to chair for depth cue      Manual Therapy   Manual Therapy Taping    Manual therapy comments Manual complete separate from all other activity     Kinesiotex Ligament Correction   RT foot arch drop      Ankle Exercises: Stretches   Gastroc Stretch 3 reps;30 seconds    Gastroc Stretch Limitations slant board      Ankle Exercises: Standing   BAPS Standing;Level 2;15 reps   PF/DF; INV/EV; CW/CCW   SLS 3 x 15" on solid floor     Heel Raises Both;20 reps  from slope    Other Standing Ankle Exercises tandem stance on 1/2 foam 3 x 30"                     PT Short Term Goals - 09/13/20 1214      PT SHORT TERM GOAL #1   Title Patient will be independent with initial HEP and self-management strategies to improve functional outcomes    Time 2    Period Weeks    Status New    Target Date 09/29/20             PT Long Term Goals - 09/13/20 1214      PT LONG TERM GOAL #1   Title Patient will be independent with final HEP and self-management strategies to improve functional outcomes    Time 4    Period Weeks    Status New    Target Date 10/13/20      PT LONG TERM GOAL #2   Title Patient will improve FOTO score by at least 5% to indicate improvement in functional outcomes    Time 4    Period Weeks    Status New    Target Date 10/13/20      PT LONG TERM GOAL #3   Title Patient will report at least 70% overall improvement in subjective complaint to indicate improvement  in ability to perform ADLs.    Time 4    Period Weeks    Status New    Target Date 10/13/20      PT LONG TERM GOAL #4   Title Patient will report being able to ambulate at least 30 minutes to demonstrate improved ability to perform functional mobility and associated tasks.    Time 4    Period Weeks    Status New    Target Date 10/13/20      PT LONG TERM GOAL #5   Title Patient will be able to perform single leg stand for at least 20 seconds bilaterally to demonstrate improved ankle stability and balance for improved functional mobility.    Time 4    Period Weeks    Status New    Target Date 10/13/20                 Plan - 10/03/20 1337    Clinical Impression Statement Patient tolerated session well overall today. Patient able to progress to standing BAPs, though was well challenged with this. Patient required verbal cues for functional squat to chair depth for cues and for avoiding forward translation. Added tandem stance on half foam for improved ankle stabilization. Patient tolerated progression to 6 inch step height for step ups without complaint. Retaped RT foot arch for added support and reduced pain. Patient will continue to benefit from skilled therapy services to progress ankle strength and stabilization to reduce pain and improve functional mobility.    Examination-Activity Limitations Transfers;Stairs;Stand;Locomotion Level    Examination-Participation Restrictions Yard Work;Cleaning;Community Activity    Stability/Clinical Decision Making Stable/Uncomplicated    Rehab Potential Good    PT Frequency 2x / week    PT Duration 4 weeks    PT Treatment/Interventions ADLs/Self Care Home Management;Aquatic Therapy;Biofeedback;Cryotherapy;Electrical Stimulation;Iontophoresis 4mg /ml Dexamethasone;Moist Heat;Traction;Balance training;Manual lymph drainage;Manual techniques;Therapeutic exercise;Therapeutic activities;Vasopneumatic Device;Taping;Splinting;Functional mobility  training;Stair training;Gait training;Orthotic Fit/Training;Patient/family education;DME Instruction;Contrast Bath;Fluidtherapy;Parrafin;Ultrasound;Neuromuscular re-education;Compression bandaging;Visual/perceptual remediation/compensation;Scar mobilization;Passive range of motion;Spinal Manipulations;Dry needling;Joint Manipulations    PT Next Visit Plan Taping PRN. Progress ankle stabilization and DF ROM, progress to balance, gait and proprioception as  able. Try single leg balance on foam next visit. walk on incline when able.    PT Home Exercise Plan ankle band 4 way, towel scrunch, seated calf stretch 11/15: tandem stance 09/20/20: tandem satnce on pillow, BTB for ankle 4 way and calf stretch standing at wall    Consulted and Agree with Plan of Care Patient           Patient will benefit from skilled therapeutic intervention in order to improve the following deficits and impairments:  Abnormal gait, Hypomobility, Decreased activity tolerance, Decreased strength, Increased fascial restricitons, Pain, Decreased scar mobility, Decreased balance, Decreased mobility, Difficulty walking, Decreased range of motion, Impaired flexibility, Improper body mechanics  Visit Diagnosis: Pain in right ankle and joints of right foot  Other abnormalities of gait and mobility     Problem List Patient Active Problem List   Diagnosis Date Noted  . Gastroesophageal reflux disease without esophagitis 05/03/2019  . Intractable vomiting 05/03/2019  . Cyst of breast 01/07/2019  . Lipoma of axilla 01/07/2019  . Prolapse of female genital organs 01/07/2019  . Arthritis of first MTP joint 07/23/2018  . Metatarsalgia of right foot 07/23/2018  . Cervical myopathy 07/31/2016  . Abdominal hernia 04/18/2014  . Guaiac positive stools 06/15/2012  . Abdominal wall mass 06/15/2012  . GERD 10/16/2010  . EPIGASTRIC PAIN 10/16/2010   1:47 PM, 10/03/20 Josue Hector PT DPT  Physical Therapist with [facility]  [phone] [place] [address] [place], [place], [address] Phone: [phone]   Fax:  [phone]  Name: ANNALYNN CENTANNI MRN: [id number] Date of Birth: [birth year]

## 2020-10-05 ENCOUNTER — Ambulatory Visit (HOSPITAL_COMMUNITY): Payer: 59 | Attending: Podiatry | Admitting: Physical Therapy

## 2020-10-05 ENCOUNTER — Other Ambulatory Visit: Payer: Self-pay

## 2020-10-05 ENCOUNTER — Encounter (HOSPITAL_COMMUNITY): Payer: Self-pay | Admitting: Physical Therapy

## 2020-10-05 DIAGNOSIS — M25571 Pain in right ankle and joints of right foot: Secondary | ICD-10-CM | POA: Insufficient documentation

## 2020-10-05 DIAGNOSIS — R2689 Other abnormalities of gait and mobility: Secondary | ICD-10-CM | POA: Insufficient documentation

## 2020-10-05 NOTE — Therapy (Signed)
[place], [place], [address] Phone: [phone]   Fax:  [phone]  Physical Therapy Treatment  Patient Details  Name: Stephanie Bishop MRN: [id number] Date of Birth: 11-Apr-1966 Referring Provider (PT): Tyson Dense DPM    Encounter Date: 10/05/2020   PT End of Session - 10/05/20 1308    Visit Number 6    Number of Visits 8    Date for PT Re-Evaluation 10/13/20    Authorization Type [employer] (No auth)    Authorization - Visit Number 6    Authorization - Number of Visits 30    PT Start Time 0277    PT Stop Time 1350    PT Time Calculation (min) 45 min    Activity Tolerance Patient tolerated treatment well    Behavior During Therapy [facility] for tasks assessed/performed           Past Medical History:  Diagnosis Date  . GERD (gastroesophageal reflux disease)   . PONV (postoperative nausea and vomiting)     Past Surgical History:  Procedure Laterality Date  . BIOPSY  05/12/2019   Procedure: BIOPSY;  Surgeon: Rogene Houston, MD;  [facility];  Service: Endoscopy;;  duodenum, antrum  . BREAST BIOPSY Left 2012   benign  . BREAST CYST ASPIRATION Bilateral   . CERVICAL SPINE SURGERY     2001  . COLONOSCOPY  07/16/2012   Procedure: COLONOSCOPY;  Surgeon: Rogene Houston, MD;  [facility];  Service: Endoscopy;  Laterality: N/A;  930  . ESOPHAGOGASTRODUODENOSCOPY N/A 05/12/2019   Procedure: ESOPHAGOGASTRODUODENOSCOPY (EGD);  Surgeon: Rogene Houston, MD;  [facility];  Service: Endoscopy;  Laterality: N/A;  245    There were no vitals filed for this visit.   Subjective Assessment - 10/05/20 1308    Subjective Patient says she is doing ok. Says tape has been helpful. Only had 1 or 2 small instances of twinge pain. Hasn't done much since last visit.    Pertinent History RT plantar fasciiotomy on 06/09/20    Limitations Standing;Walking;House hold activities    Patient Stated  Goals I want to walk again like I used to    Currently in Pain? No/denies    Pain Onset More than a month ago                             [facility] Adult PT Treatment/Exercise - 10/05/20 0001      Knee/Hip Exercises: Standing   Lateral Step Up Right;1 set;15 reps;Step Height: 6"    Functional Squat 2 sets;10 reps    Functional Squat Limitations to chair for depth cue     Stairs 6 RT on 7 inch step reciprocal no hand rail     SLS 3 x 30" (1st set solid floor) 2 sets on foam     Other Standing Knee Exercises band sidestepping on balance beam RTB 2 RT; tandem gait on balance beam 3 RT       Ankle Exercises: Stretches   Gastroc Stretch 3 reps;30 seconds    Gastroc Stretch Limitations slant board      Ankle Exercises: Standing   BAPS Standing;15 reps;Level 3   all planes x15 each    Heel Raises Both;20 reps    Other Standing Ankle Exercises tandem stance on 1/2 foam 3 x 30"  PT Short Term Goals - 09/13/20 1214      PT SHORT TERM GOAL #1   Title Patient will be independent with initial HEP and self-management strategies to improve functional outcomes    Time 2    Period Weeks    Status New    Target Date 09/29/20             PT Long Term Goals - 09/13/20 1214      PT LONG TERM GOAL #1   Title Patient will be independent with final HEP and self-management strategies to improve functional outcomes    Time 4    Period Weeks    Status New    Target Date 10/13/20      PT LONG TERM GOAL #2   Title Patient will improve FOTO score by at least 5% to indicate improvement in functional outcomes    Time 4    Period Weeks    Status New    Target Date 10/13/20      PT LONG TERM GOAL #3   Title Patient will report at least 70% overall improvement in subjective complaint to indicate improvement in ability to perform ADLs.    Time 4    Period Weeks    Status New    Target Date 10/13/20      PT LONG TERM GOAL #4   Title Patient will  report being able to ambulate at least 30 minutes to demonstrate improved ability to perform functional mobility and associated tasks.    Time 4    Period Weeks    Status New    Target Date 10/13/20      PT LONG TERM GOAL #5   Title Patient will be able to perform single leg stand for at least 20 seconds bilaterally to demonstrate improved ankle stability and balance for improved functional mobility.    Time 4    Period Weeks    Status New    Target Date 10/13/20                 Plan - 10/05/20 1409    Clinical Impression Statement Patient tolerated ther ex progressions well today. Patient was well challenged with progression to single leg stand on foam and with increase to level 3 BAPs board. Patient demos some dynamic instability with added sidestepping and tandem gait on balance beam, but was able to improve with practice. Patient required verbal cues for form and knee position with functional squats. Patient will continue to benefit from skilled therapy services to progress strength and stabilization to reduce pain and improve functional mobility.    Examination-Activity Limitations Transfers;Stairs;Stand;Locomotion Level    Examination-Participation Restrictions Yard Work;Cleaning;Community Activity    Stability/Clinical Decision Making Stable/Uncomplicated    Rehab Potential Good    PT Frequency 2x / week    PT Duration 4 weeks    PT Treatment/Interventions ADLs/Self Care Home Management;Aquatic Therapy;Biofeedback;Cryotherapy;Electrical Stimulation;Iontophoresis 4mg /ml Dexamethasone;Moist Heat;Traction;Balance training;Manual lymph drainage;Manual techniques;Therapeutic exercise;Therapeutic activities;Vasopneumatic Device;Taping;Splinting;Functional mobility training;Stair training;Gait training;Orthotic Fit/Training;Patient/family education;DME Instruction;Contrast Bath;Fluidtherapy;Parrafin;Ultrasound;Neuromuscular re-education;Compression bandaging;Visual/perceptual  remediation/compensation;Scar mobilization;Passive range of motion;Spinal Manipulations;Dry needling;Joint Manipulations    PT Next Visit Plan Taping PRN. Progress ankle stabilization and DF ROM, progress to balance, gait and proprioception as able. Walking on incline next visit    PT Home Exercise Plan ankle band 4 way, towel scrunch, seated calf stretch 11/15: tandem stance 09/20/20: tandem satnce on pillow, BTB for ankle 4 way and calf stretch standing at wall 10/05/20: single leg balance on foam/  pillow    Consulted and Agree with Plan of Care Patient           Patient will benefit from skilled therapeutic intervention in order to improve the following deficits and impairments:  Abnormal gait, Hypomobility, Decreased activity tolerance, Decreased strength, Increased fascial restricitons, Pain, Decreased scar mobility, Decreased balance, Decreased mobility, Difficulty walking, Decreased range of motion, Impaired flexibility, Improper body mechanics  Visit Diagnosis: Pain in right ankle and joints of right foot  Other abnormalities of gait and mobility     Problem List Patient Active Problem List   Diagnosis Date Noted  . Gastroesophageal reflux disease without esophagitis 05/03/2019  . Intractable vomiting 05/03/2019  . Cyst of breast 01/07/2019  . Lipoma of axilla 01/07/2019  . Prolapse of female genital organs 01/07/2019  . Arthritis of first MTP joint 07/23/2018  . Metatarsalgia of right foot 07/23/2018  . Cervical myopathy 07/31/2016  . Abdominal hernia 04/18/2014  . Guaiac positive stools 06/15/2012  . Abdominal wall mass 06/15/2012  . GERD 10/16/2010  . EPIGASTRIC PAIN 10/16/2010    2:23 PM, 10/05/20 Josue Hector PT DPT  Physical Therapist with [facility]  [phone] [place] [address] [place], [place], [address] Phone: [phone]   Fax:  [phone]  Name: Stephanie Bishop MRN:  [id number] Date of Birth: 11-24-1965

## 2020-10-17 ENCOUNTER — Ambulatory Visit (HOSPITAL_COMMUNITY): Payer: 59 | Admitting: Physical Therapy

## 2020-10-17 ENCOUNTER — Encounter (HOSPITAL_COMMUNITY): Payer: Self-pay | Admitting: Physical Therapy

## 2020-10-17 ENCOUNTER — Other Ambulatory Visit: Payer: Self-pay

## 2020-10-17 DIAGNOSIS — R2689 Other abnormalities of gait and mobility: Secondary | ICD-10-CM

## 2020-10-17 DIAGNOSIS — M25571 Pain in right ankle and joints of right foot: Secondary | ICD-10-CM | POA: Diagnosis not present

## 2020-10-17 NOTE — Therapy (Addendum)
[facility] [address] [place], [place], [address] Phone: [phone]   Fax:  [phone]  Physical Therapy Treatment  Patient Details  Name: Stephanie Bishop MRN: [id number] Date of Birth: 1966-07-16 Referring Provider (PT): Max Whittingham DPM   Progress Note Reporting Period 09/13/20 to 10/17/20  See note below for Objective Data and Assessment of Progress/Goals.       Encounter Date: 10/17/2020   PT End of Session - 10/17/20 1311    Visit Number 7    Number of Visits 15   Date for PT Re-Evaluation 11/17/20    Authorization Type [employer] (No auth)    Authorization - Visit Number 7    Authorization - Number of Visits 30    PT Start Time 6734    PT Stop Time 1330    PT Time Calculation (min) 25 min    Activity Tolerance Patient tolerated treatment well    Behavior During Therapy WFL for tasks assessed/performed           Past Medical History:  Diagnosis Date  . GERD (gastroesophageal reflux disease)   . PONV (postoperative nausea and vomiting)     Past Surgical History:  Procedure Laterality Date  . BIOPSY  05/12/2019   Procedure: BIOPSY;  Surgeon: Rogene Houston, MD;  [facility];  Service: Endoscopy;;  duodenum, antrum  . BREAST BIOPSY Left 2012   benign  . BREAST CYST ASPIRATION Bilateral   . CERVICAL SPINE SURGERY     2001  . COLONOSCOPY  07/16/2012   Procedure: COLONOSCOPY;  Surgeon: Rogene Houston, MD;  [facility];  Service: Endoscopy;  Laterality: N/A;  930  . ESOPHAGOGASTRODUODENOSCOPY N/A 05/12/2019   Procedure: ESOPHAGOGASTRODUODENOSCOPY (EGD);  Surgeon: Rogene Houston, MD;  [facility];  Service: Endoscopy;  Laterality: N/A;  245    There were no vitals filed for this visit.   Subjective Assessment - 10/17/20 1310    Subjective Patient reports 85% improvement since starting therapy. She says she still has pain with prolonged walking but this is much better.  Still feels best with her ankle brace on.    Pertinent History RT plantar fasciiotomy on 06/09/20    Limitations Standing;Walking;House hold activities    Patient Stated Goals I want to walk again like I used to    Currently in Pain? Yes    Pain Score 2     Pain Location Foot    Pain Orientation Right;Medial    Pain Descriptors / Indicators Aching    Pain Type Surgical pain    Pain Onset More than a month ago    Pain Frequency Intermittent    Aggravating Factors  prolonged walking    Pain Relieving Factors brace, rest    Effect of Pain on Daily Activities Limits              [facility] PT Assessment - 10/17/20 0001      Assessment   Medical Diagnosis RT plantar fasciiotomy     Referring Provider (PT) Max Hyatt DPM     Onset Date/Surgical Date 06/09/20    Prior Therapy Not for foot       Precautions   Precautions None      Restrictions   Weight Bearing Restrictions No      Balance Screen   Has the patient fallen in the past 6 months No      [place]  residence    Living Arrangements Spouse/significant other;Children;Parent      Prior Function   Level of Independence Independent      Cognition   Overall Cognitive Status Within Functional Limits for tasks assessed      Observation/Other Assessments   Focus on Therapeutic Outcomes (FOTO)  31% limited   was 24% limited     AROM   Overall AROM Comments bilateral AROM appears [facility]      Strength   Right Ankle Dorsiflexion 5/5    Right Ankle Plantar Flexion 4+/5    Right Ankle Inversion 5/5    Right Ankle Eversion 4+/5    Left Ankle Dorsiflexion 5/5    Left Ankle Plantar Flexion 4+/5    Left Ankle Inversion 5/5    Left Ankle Eversion 5/5      Static Standing Balance   Static Standing Balance -  Activities  Single Leg Stance - Right Leg;Single Leg Stance - Left Leg    Static Standing - Comment/# of Minutes 45 sec, 30 sec                         [facility] Adult PT  Treatment/Exercise - 10/17/20 0001      Manual Therapy   Manual Therapy Taping    Manual therapy comments Manual complete separate from all other activity     Kinesiotex Ligament Correction                  PT Education - 10/17/20 1341    Education Details reassessment findings, progress toward therapy goals and conitnued POC    Person(s) Educated Patient    Methods Explanation    Comprehension Verbalized understanding            PT Short Term Goals - 10/17/20 1336      PT SHORT TERM GOAL #1   Title Patient will be independent with initial HEP and self-management strategies to improve functional outcomes    Baseline Reports compliance, demos good return    Time 2    Period Weeks    Status Achieved    Target Date 09/29/20             PT Long Term Goals - 10/17/20 1336      PT LONG TERM GOAL #1   Title Patient will be independent with final HEP and self-management strategies to improve functional outcomes    Time 4    Period Weeks    Status On-going      PT LONG TERM GOAL #2   Title Patient will improve FOTO score by at least 5% to indicate improvement in functional outcomes    Baseline Decreased by 7%    Time 4    Period Weeks    Status On-going      PT LONG TERM GOAL #3   Title Patient will report at least 70% overall improvement in subjective complaint to indicate improvement in ability to perform ADLs.    Baseline Reports 85% improvement    Time 4    Period Weeks    Status Achieved      PT LONG TERM GOAL #4   Title Patient will report being able to ambulate at least 30 minutes to demonstrate improved ability to perform functional mobility and associated tasks.    Baseline Can ambulate 2-3 hours, per report, but with increased post activity pain    Time 4    Period Weeks    Status  Partially Met      PT LONG TERM GOAL #5   Title Patient will be able to perform single leg stand for at least 20 seconds bilaterally to demonstrate improved ankle  stability and balance for improved functional mobility.    Baseline 45 sec RT, 30 sec LT    Time 4    Period Weeks    Status Achieved                 Plan - 10/17/20 1338    Clinical Impression Statement Patient is making good progress toward therapy goals. Patient with improved ankle strength, mobility and balance. Patient continues to be limited by poor foot mechanics which negatively impacts function and is likely contributing to ongoing pain. Patient reports significant overall improvement despite decreased FOTO survey score. Patient will continue to benefit from skilled therapy services to address remaining deficits and progress activity to improve foot mechanics to reduce pain with walking and standing and improve ability to perform ADLs.    Examination-Activity Limitations Transfers;Stairs;Stand;Locomotion Level    Examination-Participation Restrictions Yard Work;Cleaning;Community Activity    Stability/Clinical Decision Making Stable/Uncomplicated    Rehab Potential Good    PT Frequency 2x / week    PT Duration 4 weeks    PT Treatment/Interventions ADLs/Self Care Home Management;Aquatic Therapy;Biofeedback;Cryotherapy;Electrical Stimulation;Iontophoresis 21m/ml Dexamethasone;Moist Heat;Traction;Balance training;Manual lymph drainage;Manual techniques;Therapeutic exercise;Therapeutic activities;Vasopneumatic Device;Taping;Splinting;Functional mobility training;Stair training;Gait training;Orthotic Fit/Training;Patient/family education;DME Instruction;Contrast Bath;Fluidtherapy;Parrafin;Ultrasound;Neuromuscular re-education;Compression bandaging;Visual/perceptual remediation/compensation;Scar mobilization;Passive range of motion;Spinal Manipulations;Dry needling;Joint Manipulations    PT Next Visit Plan Taping PRN. Progress ankle stabilization and DF ROM, progress to balance, gait and proprioception as able. Walking on incline next visit    PT Home Exercise Plan ankle band 4 way, towel  scrunch, seated calf stretch 11/15: tandem stance 09/20/20: tandem satnce on pillow, BTB for ankle 4 way and calf stretch standing at wall 10/05/20: single leg balance on foam/ pillow    Consulted and Agree with Plan of Care Patient           Patient will benefit from skilled therapeutic intervention in order to improve the following deficits and impairments:  Abnormal gait,Hypomobility,Decreased activity tolerance,Decreased strength,Increased fascial restricitons,Pain,Decreased scar mobility,Decreased balance,Decreased mobility,Difficulty walking,Decreased range of motion,Impaired flexibility,Improper body mechanics  Visit Diagnosis: Pain in right ankle and joints of right foot  Other abnormalities of gait and mobility     Problem List Patient Active Problem List   Diagnosis Date Noted  . Gastroesophageal reflux disease without esophagitis 05/03/2019  . Intractable vomiting 05/03/2019  . Cyst of breast 01/07/2019  . Lipoma of axilla 01/07/2019  . Prolapse of female genital organs 01/07/2019  . Arthritis of first MTP joint 07/23/2018  . Metatarsalgia of right foot 07/23/2018  . Cervical myopathy 07/31/2016  . Abdominal hernia 04/18/2014  . Guaiac positive stools 06/15/2012  . Abdominal wall mass 06/15/2012  . GERD 10/16/2010  . EPIGASTRIC PAIN 10/16/2010   1:43 PM, 10/17/20 CJosue HectorPT DPT  [facility] [phone] [address] [address]Phone: [phone]  Fax:  [phone] Name: Stephanie RAJMRN: [id number] of Birth: 1Nov 01, 1967

## 2020-10-19 ENCOUNTER — Encounter (HOSPITAL_COMMUNITY): Payer: Self-pay | Admitting: Physical Therapy

## 2020-10-19 ENCOUNTER — Other Ambulatory Visit: Payer: Self-pay

## 2020-10-19 ENCOUNTER — Ambulatory Visit (HOSPITAL_COMMUNITY): Payer: 59 | Admitting: Physical Therapy

## 2020-10-19 DIAGNOSIS — R2689 Other abnormalities of gait and mobility: Secondary | ICD-10-CM

## 2020-10-19 DIAGNOSIS — M25571 Pain in right ankle and joints of right foot: Secondary | ICD-10-CM

## 2020-10-19 NOTE — Therapy (Signed)
[facility] [place], [place], [address] Phone: [phone]   Fax:  [phone]  Physical Therapy Treatment  Patient Details  Name: Stephanie Bishop MRN: [id number] Date of Birth: 1966-10-01 Referring Provider (PT): Tyson Dense DPM    Encounter Date: 10/19/2020   PT End of Session - 10/19/20 1311    Visit Number 8    Number of Visits 15    Date for PT Re-Evaluation 11/17/20    Authorization Type [employer] (No auth)    Authorization - Visit Number 8    Authorization - Number of Visits 30    PT Start Time 5035    PT Stop Time 1348    PT Time Calculation (min) 40 min    Activity Tolerance Patient tolerated treatment well;Patient limited by fatigue    Behavior During Therapy [facility] for tasks assessed/performed           Past Medical History:  Diagnosis Date  . GERD (gastroesophageal reflux disease)   . PONV (postoperative nausea and vomiting)     Past Surgical History:  Procedure Laterality Date  . BIOPSY  05/12/2019   Procedure: BIOPSY;  Surgeon: Rogene Houston, MD;  [facility];  Service: Endoscopy;;  duodenum, antrum  . BREAST BIOPSY Left 2012   benign  . BREAST CYST ASPIRATION Bilateral   . CERVICAL SPINE SURGERY     2001  . COLONOSCOPY  07/16/2012   Procedure: COLONOSCOPY;  Surgeon: Rogene Houston, MD;  [facility];  Service: Endoscopy;  Laterality: N/A;  930  . ESOPHAGOGASTRODUODENOSCOPY N/A 05/12/2019   Procedure: ESOPHAGOGASTRODUODENOSCOPY (EGD);  Surgeon: Rogene Houston, MD;  [facility];  Service: Endoscopy;  Laterality: N/A;  245    There were no vitals filed for this visit.   Subjective Assessment - 10/19/20 1312    Subjective Patient says foot hurts a little more today.    Pertinent History RT plantar fasciiotomy on 06/09/20    Limitations Standing;Walking;House hold activities    Patient Stated Goals I want to walk again like I used to    Currently in Pain? Yes     Pain Score 2     Pain Location Foot    Pain Orientation Right;Medial    Pain Descriptors / Indicators Aching    Pain Type Surgical pain    Pain Onset More than a month ago    Pain Frequency Intermittent                             [facility] Adult PT Treatment/Exercise - 10/19/20 0001      Knee/Hip Exercises: Stretches   Gastroc Stretch Both;3 reps;30 seconds      Knee/Hip Exercises: Aerobic   Tread Mill 4 min @ 2.3 mph EOS      Knee/Hip Exercises: Standing   Heel Raises Both;2 sets;10 reps    Lateral Step Up Right;1 set;15 reps;Step Height: 6"    Forward Step Up Right;1 set;15 reps;Step Height: 6"    Step Down Right;1 set;15 reps;Step Height: 6"    Functional Squat 2 sets;10 reps    Functional Squat Limitations to chair for depth cue     SLS 3 x 30" on foam    Other Standing Knee Exercises band sidestepping RTB 3 RT; heel walking 2RT, toe walking 2 RT    Other Standing Knee Exercises FWD  RLE on BOSU x15, LAT step  up and over on BOSU x 10                    PT Short Term Goals - 10/17/20 1336      PT SHORT TERM GOAL #1   Title Patient will be independent with initial HEP and self-management strategies to improve functional outcomes    Baseline Reports compliance, demos good return    Time 2    Period Weeks    Status Achieved    Target Date 09/29/20             PT Long Term Goals - 10/17/20 1336      PT LONG TERM GOAL #1   Title Patient will be independent with final HEP and self-management strategies to improve functional outcomes    Time 4    Period Weeks    Status On-going      PT LONG TERM GOAL #2   Title Patient will improve FOTO score by at least 5% to indicate improvement in functional outcomes    Baseline Decreased by 7%    Time 4    Period Weeks    Status On-going      PT LONG TERM GOAL #3   Title Patient will report at least 70% overall improvement in subjective complaint to indicate improvement in ability to perform  ADLs.    Baseline Reports 85% improvement    Time 4    Period Weeks    Status Achieved      PT LONG TERM GOAL #4   Title Patient will report being able to ambulate at least 30 minutes to demonstrate improved ability to perform functional mobility and associated tasks.    Baseline Can ambulate 2-3 hours, per report, but with increased post activity pain    Time 4    Period Weeks    Status Partially Met      PT LONG TERM GOAL #5   Title Patient will be able to perform single leg stand for at least 20 seconds bilaterally to demonstrate improved ankle stability and balance for improved functional mobility.    Baseline 45 sec RT, 30 sec LT    Time 4    Period Weeks    Status Achieved                 Plan - 10/19/20 1344    Clinical Impression Statement Patient was well changed with ther ex progressions today. Patient demos unsteadiness with BOSU step ups due to ankle instability but was able to improve with reps. Patient was also challenged with heel walking, noting increased muscle fatigue in anterior leg. Patient continues to demo knee valgus and anterior translation with squatting. Patient able to correct with verbal cueing for proper mechanics. Added treadmill walking at end of session to further assess gait mechanics when fatigued, no significant difference noted. Patient will continue to benefit from skilled therapy services to progress ankle dynamic strength and stabilization to reduce pain and improve LOF with ADLs.    Examination-Activity Limitations Transfers;Stairs;Stand;Locomotion Level    Examination-Participation Restrictions Yard Work;Cleaning;Community Activity    Stability/Clinical Decision Making Stable/Uncomplicated    Rehab Potential Good    PT Frequency 2x / week    PT Duration 4 weeks    PT Treatment/Interventions ADLs/Self Care Home Management;Aquatic Therapy;Biofeedback;Cryotherapy;Electrical Stimulation;Iontophoresis 76m/ml Dexamethasone;Moist  Heat;Traction;Balance training;Manual lymph drainage;Manual techniques;Therapeutic exercise;Therapeutic activities;Vasopneumatic Device;Taping;Splinting;Functional mobility training;Stair training;Gait training;Orthotic Fit/Training;Patient/family education;DME Instruction;Contrast Bath;Fluidtherapy;Parrafin;Ultrasound;Neuromuscular re-education;Compression bandaging;Visual/perceptual remediation/compensation;Scar mobilization;Passive range of motion;Spinal Manipulations;Dry needling;Joint Manipulations  PT Next Visit Plan Taping PRN. Progress ankle stabilization and DF ROM, progress to balance, gait and proprioception as able. Walking on incline , machine walk outs when able    PT Home Exercise Plan ankle band 4 way, towel scrunch, seated calf stretch 11/15: tandem stance 09/20/20: tandem satnce on pillow, BTB for ankle 4 way and calf stretch standing at wall 10/05/20: single leg balance on foam/ pillow    Consulted and Agree with Plan of Care Patient           Patient will benefit from skilled therapeutic intervention in order to improve the following deficits and impairments:  Abnormal gait,Hypomobility,Decreased activity tolerance,Decreased strength,Increased fascial restricitons,Pain,Decreased scar mobility,Decreased balance,Decreased mobility,Difficulty walking,Decreased range of motion,Impaired flexibility,Improper body mechanics  Visit Diagnosis: Pain in right ankle and joints of right foot  Other abnormalities of gait and mobility     Problem List Patient Active Problem List   Diagnosis Date Noted  . Gastroesophageal reflux disease without esophagitis 05/03/2019  . Intractable vomiting 05/03/2019  . Cyst of breast 01/07/2019  . Lipoma of axilla 01/07/2019  . Prolapse of female genital organs 01/07/2019  . Arthritis of first MTP joint 07/23/2018  . Metatarsalgia of right foot 07/23/2018  . Cervical myopathy 07/31/2016  . Abdominal hernia 04/18/2014  . Guaiac positive stools  06/15/2012  . Abdominal wall mass 06/15/2012  . GERD 10/16/2010  . EPIGASTRIC PAIN 10/16/2010    1:54 PM, 10/19/20 Josue Hector PT DPT  Physical Therapist with [facility]  [phone] [place] [address] [place], [place], [address] Phone: [phone]   Fax:  [phone]  Name: KITA NEACE MRN: [id number] Date of Birth: December 03, 1965

## 2020-10-23 ENCOUNTER — Ambulatory Visit
Admission: RE | Admit: 2020-10-23 | Discharge: 2020-10-23 | Disposition: A | Discharge status: Home / Self Care | Payer: 59 | Source: Ambulatory Visit | Attending: Obstetrics and Gynecology | Admitting: Obstetrics and Gynecology

## 2020-10-23 ENCOUNTER — Other Ambulatory Visit: Payer: Self-pay

## 2020-10-23 DIAGNOSIS — Z1231 Encounter for screening mammogram for malignant neoplasm of breast: Secondary | ICD-10-CM

## 2020-10-24 ENCOUNTER — Ambulatory Visit (HOSPITAL_COMMUNITY): Payer: 59 | Admitting: Physical Therapy

## 2020-10-25 ENCOUNTER — Ambulatory Visit (HOSPITAL_COMMUNITY): Payer: 59 | Admitting: Physical Therapy

## 2020-10-30 ENCOUNTER — Other Ambulatory Visit: Payer: Self-pay | Admitting: Obstetrics and Gynecology

## 2020-10-30 DIAGNOSIS — N6489 Other specified disorders of breast: Secondary | ICD-10-CM

## 2020-11-07 ENCOUNTER — Ambulatory Visit (HOSPITAL_COMMUNITY): Payer: 59 | Attending: Podiatry | Admitting: Physical Therapy

## 2020-11-07 ENCOUNTER — Other Ambulatory Visit: Payer: Self-pay

## 2020-11-07 ENCOUNTER — Encounter (HOSPITAL_COMMUNITY): Payer: Self-pay | Admitting: Physical Therapy

## 2020-11-07 DIAGNOSIS — M25571 Pain in right ankle and joints of right foot: Secondary | ICD-10-CM | POA: Diagnosis not present

## 2020-11-07 DIAGNOSIS — R2689 Other abnormalities of gait and mobility: Secondary | ICD-10-CM | POA: Diagnosis present

## 2020-11-07 NOTE — Patient Instructions (Signed)
Access Code: [email or web address] URL: [email or web address] Date: 11/07/2020 Prepared by: Georges Lynch  Exercises Heel Walking - 1-2 x daily - 7 x weekly - 1 sets - 10 reps Toe Walking - 1-2 x daily - 7 x weekly - 1 sets - 10 reps Braided Sidestepping - 1-2 x daily - 7 x weekly - 1 sets - 10 reps

## 2020-11-07 NOTE — Therapy (Signed)
[place], [place], [address] Phone: [phone]   Fax:  [phone]  Physical Therapy Treatment  Patient Details  Name: Stephanie Bishop MRN: [id number] Date of Birth: Oct 18, 1966 Referring Provider (PT): Tyson Dense DPM    Encounter Date: 11/07/2020   PT End of Session - 11/07/20 1441    Visit Number 9    Number of Visits 15    Date for PT Re-Evaluation 11/17/20    Authorization Type [employer] (No auth)    Authorization - Visit Number 9    Authorization - Number of Visits 30    PT Start Time 6720    PT Stop Time 1515    PT Time Calculation (min) 43 min    Activity Tolerance Patient tolerated treatment well;Patient limited by fatigue    Behavior During Therapy [facility] for tasks assessed/performed           Past Medical History:  Diagnosis Date  . GERD (gastroesophageal reflux disease)   . PONV (postoperative nausea and vomiting)     Past Surgical History:  Procedure Laterality Date  . BIOPSY  05/12/2019   Procedure: BIOPSY;  Surgeon: Rogene Houston, MD;  [facility];  Service: Endoscopy;;  duodenum, antrum  . BREAST BIOPSY Left 2012   benign  . BREAST CYST ASPIRATION Bilateral   . CERVICAL SPINE SURGERY     2001  . COLONOSCOPY  07/16/2012   Procedure: COLONOSCOPY;  Surgeon: Rogene Houston, MD;  [facility];  Service: Endoscopy;  Laterality: N/A;  930  . ESOPHAGOGASTRODUODENOSCOPY N/A 05/12/2019   Procedure: ESOPHAGOGASTRODUODENOSCOPY (EGD);  Surgeon: Rogene Houston, MD;  [facility];  Service: Endoscopy;  Laterality: N/A;  245    There were no vitals filed for this visit.   Subjective Assessment - 11/07/20 1438    Subjective Patient says she has been busy the last 3 weeks. Says her foot has held up fairly well, only had to wear the boot one time. Says her hips hurt more recently than her foot, but notes foot does still hurt.    Pertinent History RT plantar  fasciiotomy on 06/09/20    Limitations Standing;Walking;House hold activities    Patient Stated Goals I want to walk again like I used to    Currently in Pain? Yes    Pain Score 3     Pain Location Foot    Pain Orientation Right;Posterior    Pain Descriptors / Indicators Aching    Pain Type Surgical pain    Pain Onset More than a month ago    Pain Frequency Intermittent                             [facility] Adult PT Treatment/Exercise - 11/07/20 0001      Knee/Hip Exercises: Aerobic   Tread Mill 5 min EOS ramp up to 1.4 mph at 8% incline      Knee/Hip Exercises: Machines for Information systems manager walkouts 40# x 10      Knee/Hip Exercises: Standing   Heel Raises Both;2 sets;10 reps    Lateral Step Up Right;1 set;15 reps;Step Height: 6"    Forward Step Up Right;1 set;15 reps;Step Height: 6"    Step Down Right;1 set;15 reps;Step Height: 6"    Other Standing Knee Exercises band sidestepping RTB 3 RT; heel walking 2RT, toe walking 2 RT, carioca 3RT  Other Standing Knee Exercises FWD  RLE on BOSU x15, LAT step up and over on BOSU x 10                    PT Short Term Goals - 10/17/20 1336      PT SHORT TERM GOAL #1   Title Patient will be independent with initial HEP and self-management strategies to improve functional outcomes    Baseline Reports compliance, demos good return    Time 2    Period Weeks    Status Achieved    Target Date 09/29/20             PT Long Term Goals - 10/17/20 1336      PT LONG TERM GOAL #1   Title Patient will be independent with final HEP and self-management strategies to improve functional outcomes    Time 4    Period Weeks    Status On-going      PT LONG TERM GOAL #2   Title Patient will improve FOTO score by at least 5% to indicate improvement in functional outcomes    Baseline Decreased by 7%    Time 4    Period Weeks    Status On-going      PT LONG TERM GOAL #3   Title Patient will report  at least 70% overall improvement in subjective complaint to indicate improvement in ability to perform ADLs.    Baseline Reports 85% improvement    Time 4    Period Weeks    Status Achieved      PT LONG TERM GOAL #4   Title Patient will report being able to ambulate at least 30 minutes to demonstrate improved ability to perform functional mobility and associated tasks.    Baseline Can ambulate 2-3 hours, per report, but with increased post activity pain    Time 4    Period Weeks    Status Partially Met      PT LONG TERM GOAL #5   Title Patient will be able to perform single leg stand for at least 20 seconds bilaterally to demonstrate improved ankle stability and balance for improved functional mobility.    Baseline 45 sec RT, 30 sec LT    Time 4    Period Weeks    Status Achieved                 Plan - 11/07/20 1515    Clinical Impression Statement Patient tolerate ther ex progressions well today with no increased complaint of pain. Patient shows improving ankle stability with step ups on BOSU. Patient was challenged with added carioca, but able to perform well with demo and verbal cues. Added incline treadmill at end of session. Patient able to progress to 8% incline with no increased complaint of pain but does note increased intrinsic foot fatigue with this and heel walking. Patient will continue to benefit from skilled therapy services to progress ankle stability and intrinsic foot muscle endurance to reduce pain and improve functional mobility.    Examination-Activity Limitations Transfers;Stairs;Stand;Locomotion Level    Examination-Participation Restrictions Yard Work;Cleaning;Community Activity    Stability/Clinical Decision Making Stable/Uncomplicated    Rehab Potential Good    PT Frequency 2x / week    PT Duration 4 weeks    PT Treatment/Interventions ADLs/Self Care Home Management;Aquatic Therapy;Biofeedback;Cryotherapy;Electrical Stimulation;Iontophoresis 93m/ml  Dexamethasone;Moist Heat;Traction;Balance training;Manual lymph drainage;Manual techniques;Therapeutic exercise;Therapeutic activities;Vasopneumatic Device;Taping;Splinting;Functional mobility training;Stair training;Gait training;Orthotic Fit/Training;Patient/family education;DME Instruction;Contrast Bath;Fluidtherapy;Parrafin;Ultrasound;Neuromuscular re-education;Compression bandaging;Visual/perceptual remediation/compensation;Scar mobilization;Passive range of  motion;Spinal Manipulations;Dry needling;Joint Manipulations    PT Next Visit Plan Taping PRN. Progress ankle stabilization and DF ROM, progress to balance, gait and proprioception as able. Walking on incline , machine walk outs when able    PT Home Exercise Plan ankle band 4 way, towel scrunch, seated calf stretch 11/15: tandem stance 09/20/20: tandem satnce on pillow, BTB for ankle 4 way and calf stretch standing at wall 10/05/20: single leg balance on foam/ pillow 11/07/20: heel/ toe walk, carioca    Consulted and Agree with Plan of Care Patient           Patient will benefit from skilled therapeutic intervention in order to improve the following deficits and impairments:  Abnormal gait,Hypomobility,Decreased activity tolerance,Decreased strength,Increased fascial restricitons,Pain,Decreased scar mobility,Decreased balance,Decreased mobility,Difficulty walking,Decreased range of motion,Impaired flexibility,Improper body mechanics  Visit Diagnosis: Pain in right ankle and joints of right foot  Other abnormalities of gait and mobility     Problem List Patient Active Problem List   Diagnosis Date Noted  . Gastroesophageal reflux disease without esophagitis 05/03/2019  . Intractable vomiting 05/03/2019  . Cyst of breast 01/07/2019  . Lipoma of axilla 01/07/2019  . Prolapse of female genital organs 01/07/2019  . Arthritis of first MTP joint 07/23/2018  . Metatarsalgia of right foot 07/23/2018  . Cervical myopathy 07/31/2016  .  Abdominal hernia 04/18/2014  . Guaiac positive stools 06/15/2012  . Abdominal wall mass 06/15/2012  . GERD 10/16/2010  . EPIGASTRIC PAIN 10/16/2010   4:23 PM, 11/07/20 Josue Hector PT DPT  Physical Therapist with [facility]  [phone]   [facility] [address] [place], [place], [address] Phone: [phone]   Fax:  [phone]  Name: MALLISSA LORENZEN MRN: [id number] Date of Birth: July 18, 1966

## 2020-11-09 ENCOUNTER — Encounter (HOSPITAL_COMMUNITY): Payer: Self-pay | Admitting: Physical Therapy

## 2020-11-09 ENCOUNTER — Other Ambulatory Visit: Payer: Self-pay

## 2020-11-09 ENCOUNTER — Ambulatory Visit (HOSPITAL_COMMUNITY): Payer: 59 | Admitting: Physical Therapy

## 2020-11-09 DIAGNOSIS — M25571 Pain in right ankle and joints of right foot: Secondary | ICD-10-CM | POA: Diagnosis not present

## 2020-11-09 DIAGNOSIS — R2689 Other abnormalities of gait and mobility: Secondary | ICD-10-CM

## 2020-11-09 NOTE — Therapy (Signed)
[place], [place], [address] Phone: [phone]   Fax:  [phone]  Physical Therapy Treatment  Patient Details  Name: Stephanie Bishop MRN: [id number] Date of Birth: November 07, 1965 Referring Provider (PT): Tyson Dense DPM    Encounter Date: 11/09/2020   PT End of Session - 11/09/20 1441    Visit Number 10    Number of Visits 15    Date for PT Re-Evaluation 11/17/20    Authorization Type [employer] (No auth)    Authorization - Visit Number 10    Authorization - Number of Visits 30    PT Start Time 1435    PT Stop Time 1515    PT Time Calculation (min) 40 min    Activity Tolerance Patient tolerated treatment well    Behavior During Therapy [facility] for tasks assessed/performed           Past Medical History:  Diagnosis Date  . GERD (gastroesophageal reflux disease)   . PONV (postoperative nausea and vomiting)     Past Surgical History:  Procedure Laterality Date  . BIOPSY  05/12/2019   Procedure: BIOPSY;  Surgeon: Rogene Houston, MD;  [facility];  Service: Endoscopy;;  duodenum, antrum  . BREAST BIOPSY Left 2012   benign  . BREAST CYST ASPIRATION Bilateral   . CERVICAL SPINE SURGERY     2001  . COLONOSCOPY  07/16/2012   Procedure: COLONOSCOPY;  Surgeon: Rogene Houston, MD;  [facility];  Service: Endoscopy;  Laterality: N/A;  930  . ESOPHAGOGASTRODUODENOSCOPY N/A 05/12/2019   Procedure: ESOPHAGOGASTRODUODENOSCOPY (EGD);  Surgeon: Rogene Houston, MD;  [facility];  Service: Endoscopy;  Laterality: N/A;  245    There were no vitals filed for this visit.   Subjective Assessment - 11/09/20 1439    Subjective Patient says she felt fine after last visit. Says she tried her home exercises at home barefoot and it was not good. Says both her feet hurt this morning.    Pertinent History RT plantar fasciiotomy on 06/09/20    Limitations Standing;Walking;House hold activities     Patient Stated Goals I want to walk again like I used to    Currently in Pain? Yes    Pain Score 3     Pain Location Foot    Pain Orientation Posterior    Pain Descriptors / Indicators Aching    Pain Type Surgical pain    Pain Onset More than a month ago    Pain Frequency Intermittent                             [facility] Adult PT Treatment/Exercise - 11/09/20 0001      Knee/Hip Exercises: Stretches   Gastroc Stretch Both;3 reps;30 seconds    Gastroc Stretch Limitations slant board      Knee/Hip Exercises: Aerobic   Tread Mill 4 min EOS ramp up to 2.33mh at 12% incline      Knee/Hip Exercises: MField seismologistfor SInformation systems managerwalkouts 50# x 10      Knee/Hip Exercises: Standing   Heel Raises Both;2 sets;10 reps    Heel Raises Limitations from step    Stairs 5RT on 7 inch step reciprocal    SLS 3 x 30" on foam    Other Standing Knee Exercises heel walking 3RT, toe walking 3 RT, carioca 3RT, tandem gait on  balance beam, sidestepping on beam, retro walking on beam (all 2RT)    Other Standing Knee Exercises sled push 5RT (3 x 20#)(2 x 40#)                    PT Short Term Goals - 10/17/20 1336      PT SHORT TERM GOAL #1   Title Patient will be independent with initial HEP and self-management strategies to improve functional outcomes    Baseline Reports compliance, demos good return    Time 2    Period Weeks    Status Achieved    Target Date 09/29/20             PT Long Term Goals - 10/17/20 1336      PT LONG TERM GOAL #1   Title Patient will be independent with final HEP and self-management strategies to improve functional outcomes    Time 4    Period Weeks    Status On-going      PT LONG TERM GOAL #2   Title Patient will improve FOTO score by at least 5% to indicate improvement in functional outcomes    Baseline Decreased by 7%    Time 4    Period Weeks    Status On-going      PT LONG TERM GOAL #3   Title Patient  will report at least 70% overall improvement in subjective complaint to indicate improvement in ability to perform ADLs.    Baseline Reports 85% improvement    Time 4    Period Weeks    Status Achieved      PT LONG TERM GOAL #4   Title Patient will report being able to ambulate at least 30 minutes to demonstrate improved ability to perform functional mobility and associated tasks.    Baseline Can ambulate 2-3 hours, per report, but with increased post activity pain    Time 4    Period Weeks    Status Partially Met      PT LONG TERM GOAL #5   Title Patient will be able to perform single leg stand for at least 20 seconds bilaterally to demonstrate improved ankle stability and balance for improved functional mobility.    Baseline 45 sec RT, 30 sec LT    Time 4    Period Weeks    Status Achieved                 Plan - 11/09/20 1513    Clinical Impression Statement Patient tolerated session well today. Patient was well challenged with ther ex progressions. Patient noted muscle fatigue and muscle "burn" in plantar RT foot during heel walking. Patient showing improved stability about RT ankle and able to tolerance balance on compliant surfaces without complaint. Patient able to increase incline on treadmill without pain. Patient will continue to benefit from 1-2 more visits of therapy to progress dynamic strengthening activity for foot intrinsic to reduce pain and improve LOF with ADLs.    Examination-Activity Limitations Transfers;Stairs;Stand;Locomotion Level    Examination-Participation Restrictions Yard Work;Cleaning;Community Activity    Stability/Clinical Decision Making Stable/Uncomplicated    Rehab Potential Good    PT Frequency 2x / week    PT Duration 4 weeks    PT Treatment/Interventions ADLs/Self Care Home Management;Aquatic Therapy;Biofeedback;Cryotherapy;Electrical Stimulation;Iontophoresis 62m/ml Dexamethasone;Moist Heat;Traction;Balance training;Manual lymph  drainage;Manual techniques;Therapeutic exercise;Therapeutic activities;Vasopneumatic Device;Taping;Splinting;Functional mobility training;Stair training;Gait training;Orthotic Fit/Training;Patient/family education;DME Instruction;Contrast Bath;Fluidtherapy;Parrafin;Ultrasound;Neuromuscular re-education;Compression bandaging;Visual/perceptual remediation/compensation;Scar mobilization;Passive range of motion;Spinal Manipulations;Dry needling;Joint Manipulations    PT Next  Visit Plan Taping PRN. Progress ankle stabilization and DF ROM, progress to balance, gait and proprioception as able. Increase incline on treadmill, add weight to sled push. Transition to HEP in next 1-2 visits    PT Home Exercise Plan ankle band 4 way, towel scrunch, seated calf stretch 11/15: tandem stance 09/20/20: tandem satnce on pillow, BTB for ankle 4 way and calf stretch standing at wall 10/05/20: single leg balance on foam/ pillow 11/07/20: heel/ toe walk, carioca    Consulted and Agree with Plan of Care Patient           Patient will benefit from skilled therapeutic intervention in order to improve the following deficits and impairments:  Abnormal gait,Hypomobility,Decreased activity tolerance,Decreased strength,Increased fascial restricitons,Pain,Decreased scar mobility,Decreased balance,Decreased mobility,Difficulty walking,Decreased range of motion,Impaired flexibility,Improper body mechanics  Visit Diagnosis: Pain in right ankle and joints of right foot  Other abnormalities of gait and mobility     Problem List Patient Active Problem List   Diagnosis Date Noted  . Gastroesophageal reflux disease without esophagitis 05/03/2019  . Intractable vomiting 05/03/2019  . Cyst of breast 01/07/2019  . Lipoma of axilla 01/07/2019  . Prolapse of female genital organs 01/07/2019  . Arthritis of first MTP joint 07/23/2018  . Metatarsalgia of right foot 07/23/2018  . Cervical myopathy 07/31/2016  . Abdominal hernia  04/18/2014  . Guaiac positive stools 06/15/2012  . Abdominal wall mass 06/15/2012  . GERD 10/16/2010  . EPIGASTRIC PAIN 10/16/2010   3:27 PM, 11/09/20 Josue Hector PT DPT  Physical Therapist with [facility]  [phone] [place] [address] [place], [place], [address] Phone: [phone]   Fax:  [phone]  Name: Stephanie Bishop MRN: [id number] Date of Birth: June 19, 1966

## 2020-11-14 ENCOUNTER — Ambulatory Visit: Payer: 59

## 2020-11-14 ENCOUNTER — Encounter (HOSPITAL_COMMUNITY): Payer: 59 | Admitting: Physical Therapy

## 2020-11-14 ENCOUNTER — Ambulatory Visit
Admission: RE | Admit: 2020-11-14 | Discharge: 2020-11-14 | Disposition: A | Discharge status: Home / Self Care | Payer: 59 | Source: Ambulatory Visit | Attending: Obstetrics and Gynecology | Admitting: Obstetrics and Gynecology

## 2020-11-14 ENCOUNTER — Other Ambulatory Visit: Payer: Self-pay

## 2020-11-14 DIAGNOSIS — N6489 Other specified disorders of breast: Secondary | ICD-10-CM

## 2020-11-16 ENCOUNTER — Encounter (HOSPITAL_COMMUNITY): Payer: Self-pay | Admitting: Physical Therapy

## 2020-11-16 ENCOUNTER — Other Ambulatory Visit: Payer: Self-pay

## 2020-11-16 ENCOUNTER — Ambulatory Visit (HOSPITAL_COMMUNITY): Payer: 59 | Admitting: Physical Therapy

## 2020-11-16 DIAGNOSIS — M25571 Pain in right ankle and joints of right foot: Secondary | ICD-10-CM | POA: Diagnosis not present

## 2020-11-16 DIAGNOSIS — R2689 Other abnormalities of gait and mobility: Secondary | ICD-10-CM

## 2020-11-16 NOTE — Patient Instructions (Signed)
Access Code: [email or web address] URL: [email or web address] Date: 11/16/2020 Prepared by: Josue Hector  Exercises Static Prone on Elbows - 2 x daily - 7 x weekly - 1 sets - 1 reps - 2-3 minutes hold

## 2020-11-16 NOTE — Therapy (Signed)
[facility] [address] [place], [place], [address] Phone: [phone]   Fax:  [phone]  Physical Therapy Treatment  Patient Details  Name: Stephanie Bishop MRN: [id number] Date of Birth: Jun 01, 1966 Referring Provider (PT): Max Hyatt DPM   PHYSICAL THERAPY DISCHARGE SUMMARY  Visits from Start of Care: 11  Current functional level related to goals / functional outcomes: See below    Remaining deficits: See below    Education / Equipment: See assessment  Plan: Patient agrees to discharge.  Patient goals were met. Patient is being discharged due to meeting the stated rehab goals.  ?????      Encounter Date: 11/16/2020   PT End of Session - 11/16/20 1450    Visit Number 11    Number of Visits 15    Date for PT Re-Evaluation 11/17/20    Authorization Type [employer] (No auth)    Authorization - Visit Number 11    Authorization - Number of Visits 30    PT Start Time 8264    PT Stop Time 1520    PT Time Calculation (min) 42 min    Activity Tolerance Patient tolerated treatment well    Behavior During Therapy WFL for tasks assessed/performed           Past Medical History:  Diagnosis Date  . GERD (gastroesophageal reflux disease)   . PONV (postoperative nausea and vomiting)     Past Surgical History:  Procedure Laterality Date  . BIOPSY  05/12/2019   Procedure: BIOPSY;  Surgeon: Rogene Houston, MD;  [facility];  Service: Endoscopy;;  duodenum, antrum  . BREAST BIOPSY Left 2012   benign  . BREAST CYST ASPIRATION Bilateral   . CERVICAL SPINE SURGERY     2001  . COLONOSCOPY  07/16/2012   Procedure: COLONOSCOPY;  Surgeon: Rogene Houston, MD;  [facility];  Service: Endoscopy;  Laterality: N/A;  930  . ESOPHAGOGASTRODUODENOSCOPY N/A 05/12/2019   Procedure: ESOPHAGOGASTRODUODENOSCOPY (EGD);  Surgeon: Rogene Houston, MD;  [facility];  Service: Endoscopy;  Laterality: N/A;  245     There were no vitals filed for this visit.   Subjective Assessment - 11/16/20 1446    Subjective Patient says she has had a lot of trouble with both of her feet since last session. She says she had no issues or pain during exercises, but the following day she felt like it was difficult to walk and her feet felt unsteady. She says she has not been able to do much since then. She says she has not done anything differently (later notes she has done a lot of lifting and moving lately)    Pertinent History RT plantar fasciiotomy on 06/09/20    Limitations Standing;Walking;House hold activities    Patient Stated Goals I want to walk again like I used to    Currently in Pain? Yes    Pain Score 3     Pain Location Foot    Pain Orientation Right;Medial;Lateral    Pain Descriptors / Indicators Aching    Pain Type Surgical pain    Pain Onset More than a month ago    Pain Frequency Constant              [facility] PT Assessment - 11/16/20 0001      Assessment   Medical Diagnosis RT plantar fasciiotomy     Referring Provider (PT) Max Hyatt DPM     Onset  Date/Surgical Date 06/09/20    Prior Therapy No      Precautions   Precautions None      Restrictions   Weight Bearing Restrictions No      Balance Screen   Has the patient fallen in the past 6 months No      [place] residence      Prior Function   Level of Independence Independent      Cognition   Overall Cognitive Status Within Functional Limits for tasks assessed      Observation/Other Assessments   Focus on Therapeutic Outcomes (FOTO)  15% limited   31% limited                        [facility] Adult PT Treatment/Exercise - 11/16/20 0001      Knee/Hip Exercises: Supine   Other Supine Knee/Hip Exercises prone on elbows stretch 3 min                    PT Short Term Goals - 10/17/20 1336      PT SHORT TERM GOAL #1   Title Patient will be independent with initial  HEP and self-management strategies to improve functional outcomes    Baseline Reports compliance, demos good return    Time 2    Period Weeks    Status Achieved    Target Date 09/29/20             PT Long Term Goals - 11/16/20 1510      PT LONG TERM GOAL #1   Title Patient will be independent with final HEP and self-management strategies to improve functional outcomes    Baseline Demos good return    Time 4    Period Weeks    Status Achieved      PT LONG TERM GOAL #2   Title Patient will improve FOTO score by at least 5% to indicate improvement in functional outcomes    Baseline imporved by 9%    Time 4    Period Weeks    Status Achieved      PT LONG TERM GOAL #3   Title Patient will report at least 70% overall improvement in subjective complaint to indicate improvement in ability to perform ADLs.    Baseline Reports 85% improvement    Time 4    Period Weeks    Status Achieved      PT LONG TERM GOAL #4   Title Patient will report being able to ambulate at least 30 minutes to demonstrate improved ability to perform functional mobility and associated tasks.    Baseline Can walk over an hour with no issues    Time 4    Period Weeks    Status Achieved      PT LONG TERM GOAL #5   Title Patient will be able to perform single leg stand for at least 20 seconds bilaterally to demonstrate improved ankle stability and balance for improved functional mobility.    Baseline 45 sec RT, 30 sec LT    Time 4    Period Weeks    Status Achieved                 Plan - 11/16/20 1636    Clinical Impression Statement Patient presents to therapy with complaint of bilateral foot pain today. Suspected possible lumber spine involvement due to irregular presentation of patient's symptoms. This suspicion made more prominent  when patient revealed that she has been doing a lot more lifting and moving items recently. Assessed response to lumbar extension with patient in prone lying on  elbows. Patient had full symptom resolution to 0/10 pain and was able to perform walking, heel raise and heel walking pain free. Discussed these findings with patient and reassessed progress with RT foot pain. Patient has currently met all therapy goals and is being DC today to HEP. Discussed addition of prone on elbows stretching to HEP and instructed patient to follow up with PCP if this issue persists. Answered all patient questions and instructed patient to follow up with therapy services with any further questions or concerns.    Examination-Activity Limitations Transfers;Stairs;Stand;Locomotion Level    Examination-Participation Restrictions Yard Work;Cleaning;Community Activity    Stability/Clinical Decision Making Stable/Uncomplicated    Rehab Potential Good    PT Frequency 2x / week    PT Duration 4 weeks    PT Treatment/Interventions ADLs/Self Care Home Management;Aquatic Therapy;Biofeedback;Cryotherapy;Electrical Stimulation;Iontophoresis 36m/ml Dexamethasone;Moist Heat;Traction;Balance training;Manual lymph drainage;Manual techniques;Therapeutic exercise;Therapeutic activities;Vasopneumatic Device;Taping;Splinting;Functional mobility training;Stair training;Gait training;Orthotic Fit/Training;Patient/family education;DME Instruction;Contrast Bath;Fluidtherapy;Parrafin;Ultrasound;Neuromuscular re-education;Compression bandaging;Visual/perceptual remediation/compensation;Scar mobilization;Passive range of motion;Spinal Manipulations;Dry needling;Joint Manipulations    PT Next Visit Plan DC to HEP    PT Home Exercise Plan ankle band 4 way, towel scrunch, seated calf stretch 11/15: tandem stance 09/20/20: tandem satnce on pillow, BTB for ankle 4 way and calf stretch standing at wall 10/05/20: single leg balance on foam/ pillow 11/07/20: heel/ toe walk, carioca    Consulted and Agree with Plan of Care Patient           Patient will benefit from skilled therapeutic intervention in order to improve  the following deficits and impairments:  Abnormal gait,Hypomobility,Decreased activity tolerance,Decreased strength,Increased fascial restricitons,Pain,Decreased scar mobility,Decreased balance,Decreased mobility,Difficulty walking,Decreased range of motion,Impaired flexibility,Improper body mechanics  Visit Diagnosis: Pain in right ankle and joints of right foot  Other abnormalities of gait and mobility     Problem List Patient Active Problem List   Diagnosis Date Noted  . Gastroesophageal reflux disease without esophagitis 05/03/2019  . Intractable vomiting 05/03/2019  . Cyst of breast 01/07/2019  . Lipoma of axilla 01/07/2019  . Prolapse of female genital organs 01/07/2019  . Arthritis of first MTP joint 07/23/2018  . Metatarsalgia of right foot 07/23/2018  . Cervical myopathy 07/31/2016  . Abdominal hernia 04/18/2014  . Guaiac positive stools 06/15/2012  . Abdominal wall mass 06/15/2012  . GERD 10/16/2010  . EPIGASTRIC PAIN 10/16/2010   4:44 PM, 11/16/20 CJosue HectorPT DPT  [facility] [phone] [address] [address]Phone: [phone]  Fax:  [phone] Name: Stephanie LAMASTERMRN: [id number] of Birth: 1August 15, 1967

## 2020-11-21 ENCOUNTER — Ambulatory Visit (HOSPITAL_COMMUNITY): Payer: 59 | Admitting: Physical Therapy

## 2020-11-23 ENCOUNTER — Encounter: Payer: Self-pay | Admitting: Family Medicine

## 2020-12-06 ENCOUNTER — Other Ambulatory Visit (INDEPENDENT_AMBULATORY_CARE_PROVIDER_SITE_OTHER): Payer: Self-pay | Admitting: Internal Medicine

## 2020-12-06 DIAGNOSIS — K219 Gastro-esophageal reflux disease without esophagitis: Secondary | ICD-10-CM

## 2020-12-25 ENCOUNTER — Other Ambulatory Visit: Payer: Self-pay

## 2020-12-25 ENCOUNTER — Encounter: Payer: Self-pay | Admitting: Family Medicine

## 2020-12-25 ENCOUNTER — Ambulatory Visit (INDEPENDENT_AMBULATORY_CARE_PROVIDER_SITE_OTHER): Payer: 59 | Admitting: Family Medicine

## 2020-12-25 VITALS — BP 144/94 | HR 121 | Temp 96.9°F | Wt 167.6 lb

## 2020-12-25 DIAGNOSIS — M545 Low back pain, unspecified: Secondary | ICD-10-CM | POA: Diagnosis not present

## 2020-12-25 DIAGNOSIS — R030 Elevated blood-pressure reading, without diagnosis of hypertension: Secondary | ICD-10-CM | POA: Diagnosis not present

## 2020-12-25 NOTE — Patient Instructions (Signed)

## 2020-12-25 NOTE — Progress Notes (Signed)
   Subjective:    Patient ID: Stephanie Bishop, female    DOB: Nov 04, 1966, 55 y.o.   MRN: [id number]  HPI Pt here for possible lower back pain. Patient has been participating in PT and they believe that she has some lower back pain. Pt states she is not sure if it is lower back pain or hips. If patient sits more than 30 minutes she has a hard time getting up to walk.   Had foot surgery last august Did phs therapy Had severe paiun both hips back and legs Tried tylenol No stretches Had some pain after child birth No nocturnal sx Difficult to sleep Worse laying on her side fam hx negative   Review of Systems  Constitutional: Negative for activity change, fatigue and fever.  HENT: Negative for congestion and rhinorrhea.   Respiratory: Negative for cough, chest tightness and shortness of breath.   Cardiovascular: Negative for chest pain and leg swelling.  Gastrointestinal: Negative for abdominal pain and nausea.  Skin: Negative for color change.  Neurological: Negative for dizziness and headaches.  Psychiatric/Behavioral: Negative for agitation and behavioral problems.       Objective:    Negative straight leg raise Lungs clear respiratory rate normal heart regular no murmurs pulse normal BP checked several times Best reading 144/94 with large cuff Subjective low back pain with difficulty standing and initially moving No weakness Able to walk on toes and heels without difficulty       Assessment & Plan:  Back pain Anti-inflammatory has meloxicam 1 daily for right now for the next 2 weeks Follow-up in 8 weeks Referral for physical therapy If not improving would need plain x-rays and potentially MRI Follow-up sooner problems   Blood pressure elevated recommend healthy diet regular physical activity try to lose some weight recommend repeat blood pressure in 8 weeks if not where it needs to be will need to be on medications

## 2020-12-28 ENCOUNTER — Encounter (HOSPITAL_COMMUNITY): Payer: Self-pay | Admitting: Physical Therapy

## 2020-12-28 ENCOUNTER — Ambulatory Visit (HOSPITAL_COMMUNITY): Payer: 59 | Attending: Podiatry | Admitting: Physical Therapy

## 2020-12-28 ENCOUNTER — Other Ambulatory Visit: Payer: Self-pay

## 2020-12-28 DIAGNOSIS — M545 Low back pain, unspecified: Secondary | ICD-10-CM | POA: Diagnosis not present

## 2020-12-28 DIAGNOSIS — M533 Sacrococcygeal disorders, not elsewhere classified: Secondary | ICD-10-CM | POA: Insufficient documentation

## 2020-12-28 NOTE — Patient Instructions (Signed)
Access Code: [email or web address] URL: [email or web address] Date: 12/28/2020 Prepared by: Josue Hector  Exercises Hooklying Isometric Hip Abduction with Belt - 2 x daily - 7 x weekly - 2 sets - 10 reps - 5 second hold Supine Hip Adduction Isometric with Ball - 2 x daily - 7 x weekly - 2 sets - 10 reps - 5 second hold Supine Bridge - 2 x daily - 7 x weekly - 2 sets - 10 reps - 5 second hold Static Prone on Elbows - 2 x daily - 7 x weekly - 1 sets - 1 reps - 2-3 minutes hold

## 2020-12-28 NOTE — Therapy (Signed)
[facility] [place], [place], [address] Phone: [phone]   Fax:  [phone]  Physical Therapy Evaluation  Patient Details  Name: Stephanie Bishop MRN: [id number] Date of Birth: May 09, 1966 Referring Provider (PT): Sallee Lange MD   Encounter Date: 12/28/2020   PT End of Session - 12/28/20 1200    Visit Number 1    Number of Visits 8    Date for PT Re-Evaluation 01/25/21    Authorization Type [employer] (no auth, 30VL)    Authorization - Visit Number 4    Authorization - Number of Visits 30    PT Start Time 1116    PT Stop Time 1205    PT Time Calculation (min) 49 min    Activity Tolerance Patient tolerated treatment well    Behavior During Therapy [facility] for tasks assessed/performed           Past Medical History:  Diagnosis Date  . GERD (gastroesophageal reflux disease)   . PONV (postoperative nausea and vomiting)     Past Surgical History:  Procedure Laterality Date  . BIOPSY  05/12/2019   Procedure: BIOPSY;  Surgeon: Rogene Houston, MD;  [facility];  Service: Endoscopy;;  duodenum, antrum  . BREAST BIOPSY Left 2012   benign  . BREAST CYST ASPIRATION Bilateral   . CERVICAL SPINE SURGERY     2001  . COLONOSCOPY  07/16/2012   Procedure: COLONOSCOPY;  Surgeon: Rogene Houston, MD;  [facility];  Service: Endoscopy;  Laterality: N/A;  930  . ESOPHAGOGASTRODUODENOSCOPY N/A 05/12/2019   Procedure: ESOPHAGOGASTRODUODENOSCOPY (EGD);  Surgeon: Rogene Houston, MD;  [facility];  Service: Endoscopy;  Laterality: N/A;  245    There were no vitals filed for this visit.    Subjective Assessment - 12/28/20 1129    Subjective Patient says she has been having trouble with her back lately. She gets stiff when she sits for too long. She says she is fine when she is standing but any length of time sitting hurts her back. She has been seen in this clinic previously for some foot pain,  that had questionably radicular contribution. She says her foot still hurts, but admits that she had not kept up with her exercises from prior HEP. She says she started up with these again this week and her foot has started feeling a little better. She tried taking meloxicam but it upsets her stomach.    Pertinent History RT plantar fasciiotomy on 06/09/20    Limitations Standing;Walking;House hold activities;Lifting    Patient Stated Goals Be able to get up and down without difficulty/ pain    Currently in Pain? Yes    Pain Score 7    only has pain when going from sitting to standing   Pain Location Back    Pain Orientation Posterior    Pain Descriptors / Indicators Discomfort;Pressure    Pain Type Acute pain    Pain Radiating Towards Rt foot    Pain Onset More than a month ago    Pain Frequency Intermittent    Aggravating Factors  standing from sitting    Pain Relieving Factors sitting    Effect of Pain on Daily Activities Limits              [facility] PT Assessment - 12/28/20 0001      Assessment   Medical Diagnosis LBP    Referring Provider (PT) Sallee Lange MD  Onset Date/Surgical Date --   Unsure, worse in last several months   Prior Therapy Yes      Precautions   Precautions None      Restrictions   Weight Bearing Restrictions No      Balance Screen   Has the patient fallen in the past 6 months No      [place] residence      Prior Function   Level of Independence Independent      Cognition   Overall Cognitive Status Within Functional Limits for tasks assessed      Observation/Other Assessments   Focus on Therapeutic Outcomes (FOTO)  70% function      AROM   AROM Assessment Site Lumbar    Lumbar Flexion WFL    Lumbar Extension 60% limited    Lumbar - Right Side Bend 15% limited    Lumbar - Left Side Bend 15% limited      Strength   Strength Assessment Site Hip;Knee    Right/Left Hip Right;Left    Right Hip Flexion  4+/5    Right Hip Extension 4/5    Right Hip ABduction 4/5    Left Hip Flexion 5/5    Left Hip Extension 4/5    Left Hip ABduction 4/5    Right/Left Knee Right;Left    Right Knee Extension 4+/5    Left Knee Extension 4+/5      Palpation   Palpation comment Mod TTP about RT SI joint, bilateral lumbar paraspinals (L4-5)      Special Tests   Other special tests Repeated flexion standing (no effect), repeated extension standing (reduced RLE pain, no effect on lumbar); (- LLD in supine)      Transfers   Five time sit to stand comments  15 seconds with min use of UEs                      Objective measurements completed on examination: See above findings.       [facility] Adult PT Treatment/Exercise - 12/28/20 0001      Knee/Hip Exercises: Supine   Bridges 5 reps    Other Supine Knee/Hip Exercises isometric hip abduction/ adduction 5 x 5"                  PT Education - 12/28/20 1133    Education Details on evaluation findings, POC and HEP    Person(s) Educated Patient    Methods Explanation;Handout    Comprehension Verbalized understanding            PT Short Term Goals - 12/28/20 1321      PT SHORT TERM GOAL #1   Title Patient will be independent with initial HEP and self-management strategies to improve functional outcomes    Time 2    Period Weeks    Status New    Target Date 01/11/21             PT Long Term Goals - 12/28/20 1321      PT LONG TERM GOAL #1   Title Patient will improve lumbar extension by 25% for improved ability to perform functional mobility tasks and ADLs.    Time 4    Period Weeks    Status New    Target Date 01/25/21      PT LONG TERM GOAL #2   Title Patient will improve FOTO score by at least 8% to indicate improvement in functional outcomes  Time 4    Period Weeks    Status New    Target Date 01/25/21      PT LONG TERM GOAL #3   Title Patient will be able to perform stand x 5 in < 12 seconds to  demonstrate improvement in functional mobility and reduced risk for falls.    Time 4    Period Weeks    Status New    Target Date 01/25/21      PT LONG TERM GOAL #4   Title Patient will report at least 75% overall improvement in subjective complaint to indicate improvement in ability to perform ADLs.    Time 4    Period Weeks    Status New    Target Date 01/25/21                  Plan - 12/28/20 1317    Clinical Impression Statement Patient is a 55 y.o. female who presents to physical therapy with complaint of low back pain. Patient demonstrates decreased strength, ROM restriction, increased tenderness to palpation and postural abnormalities which are likely contributing to symptoms of pain and are negatively impacting patient ability to perform ADLs and functional mobility tasks. Patient will benefit from skilled physical therapy services to address these deficits to reduce pain and improve level of function with ADLs and functional mobility tasks.    Examination-Activity Limitations Transfers;Stairs;Stand;Locomotion Level;Bend;Sit;Lift    Examination-Participation Restrictions Yard Work;Cleaning;Community Activity;Laundry    Stability/Clinical Decision Making Stable/Uncomplicated    Clinical Decision Making Low    Rehab Potential Good    PT Frequency 2x / week    PT Duration 4 weeks    PT Treatment/Interventions ADLs/Self Care Home Management;Aquatic Therapy;Biofeedback;Cryotherapy;Electrical Stimulation;Iontophoresis 4mg /ml Dexamethasone;Moist Heat;Traction;Balance training;Manual lymph drainage;Manual techniques;Therapeutic exercise;Therapeutic activities;Vasopneumatic Device;Taping;Splinting;Functional mobility training;Stair training;Gait training;Orthotic Fit/Training;Patient/family education;DME Instruction;Contrast Bath;Fluidtherapy;Parrafin;Ultrasound;Neuromuscular re-education;Compression bandaging;Visual/perceptual remediation/compensation;Scar mobilization;Passive range  of motion;Spinal Manipulations;Dry needling;Joint Manipulations    PT Next Visit Plan Review goals and HEP. Progress hip and core strength as tolerated. Add bent knee raise, try prone press ups if prone on elbows tolerated well.    PT Home Exercise Plan Eval: iso hip abduction/ adduction, bridge, POE    Consulted and Agree with Plan of Care Patient           Patient will benefit from skilled therapeutic intervention in order to improve the following deficits and impairments:  Abnormal gait,Hypomobility,Decreased activity tolerance,Decreased strength,Increased fascial restricitons,Pain,Decreased scar mobility,Decreased balance,Decreased mobility,Difficulty walking,Decreased range of motion,Impaired flexibility,Improper body mechanics,Postural dysfunction  Visit Diagnosis: Low back pain, unspecified back pain laterality, unspecified chronicity, unspecified whether sciatica present  Sacrococcygeal disorders, not elsewhere classified     Problem List Patient Active Problem List   Diagnosis Date Noted  . Gastroesophageal reflux disease without esophagitis 05/03/2019  . Intractable vomiting 05/03/2019  . Cyst of breast 01/07/2019  . Lipoma of axilla 01/07/2019  . Prolapse of female genital organs 01/07/2019  . Arthritis of first MTP joint 07/23/2018  . Metatarsalgia of right foot 07/23/2018  . Cervical myopathy 07/31/2016  . Abdominal hernia 04/18/2014  . Guaiac positive stools 06/15/2012  . Abdominal wall mass 06/15/2012  . GERD 10/16/2010  . EPIGASTRIC PAIN 10/16/2010    1:27 PM, 12/28/20 Josue Hector PT DPT  Physical Therapist with [facility]  [phone] [place] [address] [place], [place], [address] Phone: [phone]   Fax:  [phone]  Name: Stephanie Bishop MRN: [id number] Date of Birth:  12/29/1965  

## 2021-01-02 ENCOUNTER — Ambulatory Visit (HOSPITAL_COMMUNITY): Payer: 59 | Attending: Podiatry | Admitting: Physical Therapy

## 2021-01-02 ENCOUNTER — Encounter (HOSPITAL_COMMUNITY): Payer: Self-pay | Admitting: Physical Therapy

## 2021-01-02 ENCOUNTER — Other Ambulatory Visit: Payer: Self-pay

## 2021-01-02 DIAGNOSIS — M545 Low back pain, unspecified: Secondary | ICD-10-CM | POA: Insufficient documentation

## 2021-01-02 DIAGNOSIS — M533 Sacrococcygeal disorders, not elsewhere classified: Secondary | ICD-10-CM | POA: Diagnosis present

## 2021-01-02 NOTE — Therapy (Signed)
[place], [place], [address] Phone: [phone]   Fax:  [phone]  Physical Therapy Treatment  Patient Details  Name: Stephanie Bishop MRN: [id number] Date of Birth: [birth year] Referring Provider (PT): Sallee Lange MD   Encounter Date: 01/02/2021   PT End of Session - 01/02/21 0831    Visit Number 2    Number of Visits 8    Date for PT Re-Evaluation 01/25/21    Authorization Type [employer] (no auth, 30VL)    Authorization - Visit Number 5    Authorization - Number of Visits 30    PT Start Time 0818    PT Stop Time 0908    PT Time Calculation (min) 50 min    Activity Tolerance Patient tolerated treatment well    Behavior During Therapy [facility] for tasks assessed/performed           Past Medical History:  Diagnosis Date  . GERD (gastroesophageal reflux disease)   . PONV (postoperative nausea and vomiting)     Past Surgical History:  Procedure Laterality Date  . BIOPSY  05/12/2019   Procedure: BIOPSY;  Surgeon: Rogene Houston, MD;  [facility];  Service: Endoscopy;;  duodenum, antrum  . BREAST BIOPSY Left 2012   benign  . BREAST CYST ASPIRATION Bilateral   . CERVICAL SPINE SURGERY     2001  . COLONOSCOPY  07/16/2012   Procedure: COLONOSCOPY;  Surgeon: Rogene Houston, MD;  [facility];  Service: Endoscopy;  Laterality: N/A;  930  . ESOPHAGOGASTRODUODENOSCOPY N/A 05/12/2019   Procedure: ESOPHAGOGASTRODUODENOSCOPY (EGD);  Surgeon: Rogene Houston, MD;  [facility];  Service: Endoscopy;  Laterality: N/A;  245    There were no vitals filed for this visit.   Subjective Assessment - 01/02/21 0830    Subjective Patient says she is doing well with exercise overall. Exercises were "doable". She did this on the hardwood floors and home and this was somewhat difficult. Educated patient to try this with a comforter for cushion. Her back feels a little better today.     Pertinent History RT plantar fasciiotomy on 06/09/20    Limitations Standing;Walking;House hold activities;Lifting    Patient Stated Goals Be able to get up and down without difficulty/ pain    Currently in Pain? Yes    Pain Score 2     Pain Location Back    Pain Orientation Lower    Pain Descriptors / Indicators Discomfort;Pressure    Pain Type Acute pain    Pain Onset More than a month ago    Pain Frequency Intermittent                             [facility] Adult PT Treatment/Exercise - 01/02/21 0001      Exercises   Exercises Lumbar      Lumbar Exercises: Stretches   Prone on Elbows Stretch 3 reps;60 seconds    Press Ups 10 reps      Lumbar Exercises: Supine   Ab Set 15 reps    Clam 15 reps;Limitations    Clam Limitations GTB    Bent Knee Raise 20 reps    Bridge 15 reps    Other Supine Lumbar Exercises isometric hip adduction 15 x 5"                    PT Short Term Goals -  12/28/20 1321      PT SHORT TERM GOAL #1   Title Patient will be independent with initial HEP and self-management strategies to improve functional outcomes    Time 2    Period Weeks    Status New    Target Date 01/11/21             PT Long Term Goals - 12/28/20 1321      PT LONG TERM GOAL #1   Title Patient will improve lumbar extension by 25% for improved ability to perform functional mobility tasks and ADLs.    Time 4    Period Weeks    Status New    Target Date 01/25/21      PT LONG TERM GOAL #2   Title Patient will improve FOTO score by at least 8% to indicate improvement in functional outcomes    Time 4    Period Weeks    Status New    Target Date 01/25/21      PT LONG TERM GOAL #3   Title Patient will be able to perform stand x 5 in < 12 seconds to demonstrate improvement in functional mobility and reduced risk for falls.    Time 4    Period Weeks    Status New    Target Date 01/25/21      PT LONG TERM GOAL #4   Title Patient will report at least  75% overall improvement in subjective complaint to indicate improvement in ability to perform ADLs.    Time 4    Period Weeks    Status New    Target Date 01/25/21                 Plan - 01/02/21 1425    Clinical Impression Statement Reviewed therapy goals and assessed response to HEP. Patient responding well to current HEP so activity progressed. Added resistance band to supine clamshells. Added ab bracing and bent knee marching for core strength progressions. Patient had difficulty activating core with these and required several verbal cues. Progressed prone on elbows to press ups. Patient noting no immediate effect but notes mild improvement in symptoms overall at end of session. Updated HEP and issued handout. Patient will continue to benefit from core strength and lumbar mobility progressions to reduce pain level and improve functional mobility.    Examination-Activity Limitations Transfers;Stairs;Stand;Locomotion Level;Bend;Sit;Lift    Examination-Participation Restrictions Yard Work;Cleaning;Community Activity;Laundry    Stability/Clinical Decision Making Stable/Uncomplicated    Rehab Potential Good    PT Frequency 2x / week    PT Duration 4 weeks    PT Treatment/Interventions ADLs/Self Care Home Management;Aquatic Therapy;Biofeedback;Cryotherapy;Electrical Stimulation;Iontophoresis 4mg /ml Dexamethasone;Moist Heat;Traction;Balance training;Manual lymph drainage;Manual techniques;Therapeutic exercise;Therapeutic activities;Vasopneumatic Device;Taping;Splinting;Functional mobility training;Stair training;Gait training;Orthotic Fit/Training;Patient/family education;DME Instruction;Contrast Bath;Fluidtherapy;Parrafin;Ultrasound;Neuromuscular re-education;Compression bandaging;Visual/perceptual remediation/compensation;Scar mobilization;Passive range of motion;Spinal Manipulations;Dry needling;Joint Manipulations    PT Next Visit Plan Assess repsonse to press ups. Continue extension based  activity if indicated. Progress core strength as tolerated. Add modified dead bug, bridges with alernating leg extension.    PT Home Exercise Plan Eval: iso hip abduction/ adduction, bridge, POE 3/1 press ups, ab set, supine clam    Consulted and Agree with Plan of Care Patient           Patient will benefit from skilled therapeutic intervention in order to improve the following deficits and impairments:  Abnormal gait,Hypomobility,Decreased activity tolerance,Decreased strength,Increased fascial restricitons,Pain,Decreased scar mobility,Decreased balance,Decreased mobility,Difficulty walking,Decreased range of motion,Impaired flexibility,Improper body mechanics,Postural dysfunction  Visit Diagnosis: Low back pain,  unspecified back pain laterality, unspecified chronicity, unspecified whether sciatica present  Sacrococcygeal disorders, not elsewhere classified     Problem List Patient Active Problem List   Diagnosis Date Noted  . Gastroesophageal reflux disease without esophagitis 05/03/2019  . Intractable vomiting 05/03/2019  . Cyst of breast 01/07/2019  . Lipoma of axilla 01/07/2019  . Prolapse of female genital organs 01/07/2019  . Arthritis of first MTP joint 07/23/2018  . Metatarsalgia of right foot 07/23/2018  . Cervical myopathy 07/31/2016  . Abdominal hernia 04/18/2014  . Guaiac positive stools 06/15/2012  . Abdominal wall mass 06/15/2012  . GERD 10/16/2010  . EPIGASTRIC PAIN 10/16/2010   2:31 PM, 01/02/21 Josue Hector PT DPT  Physical Therapist with [facility]  [phone] [place] [address] [place], [place], [address] Phone: [phone]   Fax:  [phone]  Name: Stephanie Bishop MRN: [id number] Date of Birth: 08-Aug-1966

## 2021-01-02 NOTE — Patient Instructions (Signed)
Access Code: [email or web address] URL: [email or web address] Date: 01/02/2021 Prepared by: Josue Hector  Exercises Prone Press Up - 3 x daily - 7 x weekly - 2 sets - 10 reps Supine Transversus Abdominis Bracing - Hands on Stomach - 3 x daily - 7 x weekly - 2 sets - 10 reps - 5 seconds hold Hooklying Clamshell with Resistance - 3 x daily - 7 x weekly - 2 sets - 10 reps - 5 seconds hold

## 2021-01-03 ENCOUNTER — Ambulatory Visit (HOSPITAL_COMMUNITY): Payer: 59 | Admitting: Physical Therapy

## 2021-01-03 ENCOUNTER — Encounter (HOSPITAL_COMMUNITY): Payer: Self-pay | Admitting: Physical Therapy

## 2021-01-03 DIAGNOSIS — M545 Low back pain, unspecified: Secondary | ICD-10-CM

## 2021-01-03 DIAGNOSIS — M533 Sacrococcygeal disorders, not elsewhere classified: Secondary | ICD-10-CM

## 2021-01-03 NOTE — Therapy (Signed)
[place], [place], [address] Phone: [phone]   Fax:  [phone]  Physical Therapy Treatment  Patient Details  Name: Stephanie Bishop MRN: [id number] Date of Birth: November 10, 1965 Referring Provider (PT): Sallee Lange MD   Encounter Date: 01/03/2021   PT End of Session - 01/03/21 0820    Visit Number 3    Number of Visits 8    Date for PT Re-Evaluation 01/25/21    Authorization Type [employer] (no auth, 30VL)    Authorization - Visit Number 6    Authorization - Number of Visits 30    PT Start Time [phone]    PT Stop Time 0858    PT Time Calculation (min) 42 min    Activity Tolerance Patient tolerated treatment well    Behavior During Therapy [facility] for tasks assessed/performed           Past Medical History:  Diagnosis Date  . GERD (gastroesophageal reflux disease)   . PONV (postoperative nausea and vomiting)     Past Surgical History:  Procedure Laterality Date  . BIOPSY  05/12/2019   Procedure: BIOPSY;  Surgeon: Rogene Houston, MD;  [facility];  Service: Endoscopy;;  duodenum, antrum  . BREAST BIOPSY Left 2012   benign  . BREAST CYST ASPIRATION Bilateral   . CERVICAL SPINE SURGERY     2001  . COLONOSCOPY  07/16/2012   Procedure: COLONOSCOPY;  Surgeon: Rogene Houston, MD;  [facility];  Service: Endoscopy;  Laterality: N/A;  930  . ESOPHAGOGASTRODUODENOSCOPY N/A 05/12/2019   Procedure: ESOPHAGOGASTRODUODENOSCOPY (EGD);  Surgeon: Rogene Houston, MD;  [facility];  Service: Endoscopy;  Laterality: N/A;  245    There were no vitals filed for this visit.   Subjective Assessment - 01/03/21 0820    Subjective Patient says she is "ok". She is not sore from yesterday.    Pertinent History RT plantar fasciiotomy on 06/09/20    Limitations Standing;Walking;House hold activities;Lifting    Patient Stated Goals Be able to get up and down without difficulty/ pain     Currently in Pain? No/denies    Pain Onset More than a month ago                             [facility] Adult PT Treatment/Exercise - 01/03/21 0001      Lumbar Exercises: Supine   Ab Set 15 reps    Clam 20 reps    Clam Limitations GTB    Bent Knee Raise 20 reps    Dead Bug 10 reps    Bridge 20 reps    Bridge Limitations bridge with alternating leg extension x10    Straight Leg Raise 20 reps    Other Supine Lumbar Exercises isometric hip adduction 20 x 5"                    PT Short Term Goals - 12/28/20 1321      PT SHORT TERM GOAL #1   Title Patient will be independent with initial HEP and self-management strategies to improve functional outcomes    Time 2    Period Weeks    Status New    Target Date 01/11/21             PT Long Term Goals - 12/28/20 1321      PT LONG TERM GOAL #1  Title Patient will improve lumbar extension by 25% for improved ability to perform functional mobility tasks and ADLs.    Time 4    Period Weeks    Status New    Target Date 01/25/21      PT LONG TERM GOAL #2   Title Patient will improve FOTO score by at least 8% to indicate improvement in functional outcomes    Time 4    Period Weeks    Status New    Target Date 01/25/21      PT LONG TERM GOAL #3   Title Patient will be able to perform stand x 5 in < 12 seconds to demonstrate improvement in functional mobility and reduced risk for falls.    Time 4    Period Weeks    Status New    Target Date 01/25/21      PT LONG TERM GOAL #4   Title Patient will report at least 75% overall improvement in subjective complaint to indicate improvement in ability to perform ADLs.    Time 4    Period Weeks    Status New    Target Date 01/25/21                 Plan - 01/03/21 0844    Clinical Impression Statement Patient tolerated session well today with no increased complaint of pain. Patient continues to have difficulty with TA activation requiring several  verbal cues. Progress core strengthening on mat. Patient educated on purpose and function of all added exercise. Patient will continue to benefit from core strength progressions to decrease back pain and improve functional ability.    Examination-Activity Limitations Transfers;Stairs;Stand;Locomotion Level;Bend;Sit;Lift    Examination-Participation Restrictions Yard Work;Cleaning;Community Activity;Laundry    Stability/Clinical Decision Making Stable/Uncomplicated    Rehab Potential Good    PT Frequency 2x / week    PT Duration 4 weeks    PT Treatment/Interventions ADLs/Self Care Home Management;Aquatic Therapy;Biofeedback;Cryotherapy;Electrical Stimulation;Iontophoresis 4mg /ml Dexamethasone;Moist Heat;Traction;Balance training;Manual lymph drainage;Manual techniques;Therapeutic exercise;Therapeutic activities;Vasopneumatic Device;Taping;Splinting;Functional mobility training;Stair training;Gait training;Orthotic Fit/Training;Patient/family education;DME Instruction;Contrast Bath;Fluidtherapy;Parrafin;Ultrasound;Neuromuscular re-education;Compression bandaging;Visual/perceptual remediation/compensation;Scar mobilization;Passive range of motion;Spinal Manipulations;Dry needling;Joint Manipulations    PT Next Visit Plan Continue extension based activity. Progress core strength as tolerated. Add step ups, palloff press    PT Home Exercise Plan Eval: iso hip abduction/ adduction, bridge, POE 3/1 press ups, ab set, supine clam 3/2 deadbug, SLR, bridge with leg extension    Consulted and Agree with Plan of Care Patient           Patient will benefit from skilled therapeutic intervention in order to improve the following deficits and impairments:  Abnormal gait,Hypomobility,Decreased activity tolerance,Decreased strength,Increased fascial restricitons,Pain,Decreased scar mobility,Decreased balance,Decreased mobility,Difficulty walking,Decreased range of motion,Impaired flexibility,Improper body  mechanics,Postural dysfunction  Visit Diagnosis: Low back pain, unspecified back pain laterality, unspecified chronicity, unspecified whether sciatica present  Sacrococcygeal disorders, not elsewhere classified     Problem List Patient Active Problem List   Diagnosis Date Noted  . Gastroesophageal reflux disease without esophagitis 05/03/2019  . Intractable vomiting 05/03/2019  . Cyst of breast 01/07/2019  . Lipoma of axilla 01/07/2019  . Prolapse of female genital organs 01/07/2019  . Arthritis of first MTP joint 07/23/2018  . Metatarsalgia of right foot 07/23/2018  . Cervical myopathy 07/31/2016  . Abdominal hernia 04/18/2014  . Guaiac positive stools 06/15/2012  . Abdominal wall mass 06/15/2012  . GERD 10/16/2010  . EPIGASTRIC PAIN 10/16/2010   8:58 AM, 01/03/21 Josue Hector PT DPT  Physical Therapist with [facility]  [phone] [place] [address] [place], [place], [address] Phone: [phone]   Fax:  [phone]  Name: Stephanie Bishop MRN: [id number] Date of Birth: 1966-04-07

## 2021-01-03 NOTE — Patient Instructions (Signed)
Access Code: [email or web address] URL: [email or web address] Date: 01/03/2021 Prepared by: Josue Hector  Exercises Supine Straight Leg Raises - 2 x daily - 7 x weekly - 2 sets - 10 reps Supine Dead Bug with Leg Extension - 2 x daily - 7 x weekly - 2 sets - 10 reps Alternating Single Leg Bridge - 2 x daily - 7 x weekly - 2 sets - 10 reps

## 2021-01-09 ENCOUNTER — Other Ambulatory Visit: Payer: Self-pay

## 2021-01-09 ENCOUNTER — Ambulatory Visit (HOSPITAL_COMMUNITY): Payer: 59 | Admitting: Physical Therapy

## 2021-01-09 ENCOUNTER — Encounter (HOSPITAL_COMMUNITY): Payer: Self-pay | Admitting: Physical Therapy

## 2021-01-09 DIAGNOSIS — M545 Low back pain, unspecified: Secondary | ICD-10-CM

## 2021-01-09 DIAGNOSIS — M533 Sacrococcygeal disorders, not elsewhere classified: Secondary | ICD-10-CM

## 2021-01-09 NOTE — Therapy (Signed)
[place], [place], [address] Phone: [phone]   Fax:  [phone]  Physical Therapy Treatment  Patient Details  Name: Stephanie Bishop MRN: [id number] Date of Birth: [birth year] Referring Provider (PT): Sallee Lange MD   Encounter Date: 01/09/2021   PT End of Session - 01/09/21 1126    Visit Number 4    Number of Visits 8    Date for PT Re-Evaluation 01/25/21    Authorization Type [employer] (no auth, 30VL)    Authorization - Visit Number 7    Authorization - Number of Visits 30    PT Start Time 1116    PT Stop Time 1200    PT Time Calculation (min) 44 min    Activity Tolerance Patient tolerated treatment well    Behavior During Therapy [facility] for tasks assessed/performed           Past Medical History:  Diagnosis Date  . GERD (gastroesophageal reflux disease)   . PONV (postoperative nausea and vomiting)     Past Surgical History:  Procedure Laterality Date  . BIOPSY  05/12/2019   Procedure: BIOPSY;  Surgeon: Rogene Houston, MD;  [facility];  Service: Endoscopy;;  duodenum, antrum  . BREAST BIOPSY Left 2012   benign  . BREAST CYST ASPIRATION Bilateral   . CERVICAL SPINE SURGERY     2001  . COLONOSCOPY  07/16/2012   Procedure: COLONOSCOPY;  Surgeon: Rogene Houston, MD;  [facility];  Service: Endoscopy;  Laterality: N/A;  930  . ESOPHAGOGASTRODUODENOSCOPY N/A 05/12/2019   Procedure: ESOPHAGOGASTRODUODENOSCOPY (EGD);  Surgeon: Rogene Houston, MD;  [facility];  Service: Endoscopy;  Laterality: N/A;  245    There were no vitals filed for this visit.   Subjective Assessment - 01/09/21 1125    Subjective Patient says her back and her feet are feeling much better. She says she felt really good and had no pain the past few days so she decided to help her husband in their home garden yesterday. She thinks she reinjured a rib that had been bothering her from weeks ago.     Pertinent History RT plantar fasciiotomy on 06/09/20    Limitations Standing;Walking;House hold activities;Lifting    Patient Stated Goals Be able to get up and down without difficulty/ pain    Currently in Pain? Yes    Pain Score 6     Pain Location Rib cage    Pain Orientation Left    Pain Descriptors / Indicators Sharp    Pain Type Acute pain    Pain Onset More than a month ago                             [facility] Adult PT Treatment/Exercise - 01/09/21 0001      Lumbar Exercises: Standing   Other Standing Lumbar Exercises palloff press RTB 2 x 10 each      Lumbar Exercises: Supine   Ab Set 10 reps    Clam 20 reps    Clam Limitations GTB    Dead Bug 20 reps    Bridge 20 reps    Bridge Limitations bridge with alternating leg extension x10    Straight Leg Raise 20 reps    Other Supine Lumbar Exercises diaphragm breathing x 10 breaths      Knee/Hip Exercises: Standing   Forward Step Up Step Height: 6";2  sets;10 reps;Both                    PT Short Term Goals - 12/28/20 1321      PT SHORT TERM GOAL #1   Title Patient will be independent with initial HEP and self-management strategies to improve functional outcomes    Time 2    Period Weeks    Status New    Target Date 01/11/21             PT Long Term Goals - 12/28/20 1321      PT LONG TERM GOAL #1   Title Patient will improve lumbar extension by 25% for improved ability to perform functional mobility tasks and ADLs.    Time 4    Period Weeks    Status New    Target Date 01/25/21      PT LONG TERM GOAL #2   Title Patient will improve FOTO score by at least 8% to indicate improvement in functional outcomes    Time 4    Period Weeks    Status New    Target Date 01/25/21      PT LONG TERM GOAL #3   Title Patient will be able to perform stand x 5 in < 12 seconds to demonstrate improvement in functional mobility and reduced risk for falls.    Time 4    Period Weeks    Status New     Target Date 01/25/21      PT LONG TERM GOAL #4   Title Patient will report at least 75% overall improvement in subjective complaint to indicate improvement in ability to perform ADLs.    Time 4    Period Weeks    Status New    Target Date 01/25/21                 Plan - 01/09/21 1207    Clinical Impression Statement Patient tolerated session well overall today. She noted some pain in LT flank, mostly with sit to supine and supine to sit transfers. Patient notes pain is alleviated when lying supine and reports no pain with table exercise. Able to progress core strength with adding palloff press and adding diaphragm breathing for pain reduction. Patient educated on purpose and function of all added exercise. She required verbal cues and demo for form with palloff press. Patient will benefit from ongoing therapy services to reduce pain and improve functional level    Examination-Activity Limitations Transfers;Stairs;Stand;Locomotion Level;Bend;Sit;Lift    Examination-Participation Restrictions Yard Work;Cleaning;Community Activity;Laundry    Stability/Clinical Decision Making Stable/Uncomplicated    Rehab Potential Good    PT Frequency 2x / week    PT Duration 4 weeks    PT Treatment/Interventions ADLs/Self Care Home Management;Aquatic Therapy;Biofeedback;Cryotherapy;Electrical Stimulation;Iontophoresis 4mg /ml Dexamethasone;Moist Heat;Traction;Balance training;Manual lymph drainage;Manual techniques;Therapeutic exercise;Therapeutic activities;Vasopneumatic Device;Taping;Splinting;Functional mobility training;Stair training;Gait training;Orthotic Fit/Training;Patient/family education;DME Instruction;Contrast Bath;Fluidtherapy;Parrafin;Ultrasound;Neuromuscular re-education;Compression bandaging;Visual/perceptual remediation/compensation;Scar mobilization;Passive range of motion;Spinal Manipulations;Dry needling;Joint Manipulations    PT Next Visit Plan Continue extension based activity.  Progress core strength as tolerated. add band rows and extension    PT Home Exercise Plan Eval: iso hip abduction/ adduction, bridge, POE 3/1 press ups, ab set, supine clam 3/2 deadbug, SLR, bridge with leg extension    Consulted and Agree with Plan of Care Patient           Patient will benefit from skilled therapeutic intervention in order to improve the following deficits and impairments:  Abnormal gait,Hypomobility,Decreased activity tolerance,Decreased strength,Increased fascial restricitons,Pain,Decreased scar  mobility,Decreased balance,Decreased mobility,Difficulty walking,Decreased range of motion,Impaired flexibility,Improper body mechanics,Postural dysfunction  Visit Diagnosis: Low back pain, unspecified back pain laterality, unspecified chronicity, unspecified whether sciatica present  Sacrococcygeal disorders, not elsewhere classified     Problem List Patient Active Problem List   Diagnosis Date Noted  . Gastroesophageal reflux disease without esophagitis 05/03/2019  . Intractable vomiting 05/03/2019  . Cyst of breast 01/07/2019  . Lipoma of axilla 01/07/2019  . Prolapse of female genital organs 01/07/2019  . Arthritis of first MTP joint 07/23/2018  . Metatarsalgia of right foot 07/23/2018  . Cervical myopathy 07/31/2016  . Abdominal hernia 04/18/2014  . Guaiac positive stools 06/15/2012  . Abdominal wall mass 06/15/2012  . GERD 10/16/2010  . EPIGASTRIC PAIN 10/16/2010   12:12 PM, 01/09/21 Josue Hector PT DPT  Physical Therapist with [facility]  [phone] [place] [address] [place], [place], [address] Phone: [phone]   Fax:  [phone]  Name: Stephanie Bishop MRN: [id number] Date of Birth: 12-Mar-1966

## 2021-01-11 ENCOUNTER — Ambulatory Visit (HOSPITAL_COMMUNITY): Payer: 59 | Admitting: Physical Therapy

## 2021-01-11 ENCOUNTER — Encounter (HOSPITAL_COMMUNITY): Payer: Self-pay | Admitting: Physical Therapy

## 2021-01-11 ENCOUNTER — Other Ambulatory Visit: Payer: Self-pay

## 2021-01-11 DIAGNOSIS — M533 Sacrococcygeal disorders, not elsewhere classified: Secondary | ICD-10-CM

## 2021-01-11 DIAGNOSIS — M545 Low back pain, unspecified: Secondary | ICD-10-CM

## 2021-01-11 NOTE — Therapy (Signed)
[place], [place], [address] Phone: [phone]   Fax:  [phone]  Physical Therapy Treatment  Patient Details  Name: Stephanie Bishop MRN: [id number] Date of Birth: [birth year] Referring Provider (PT): Sallee Lange MD   Encounter Date: 01/11/2021   PT End of Session - 01/11/21 1037    Visit Number 5    Number of Visits 8    Date for PT Re-Evaluation 01/25/21    Authorization Type [employer] (no auth, 30VL)    Authorization - Visit Number 8    Authorization - Number of Visits 30    PT Start Time 9678    PT Stop Time 1110    PT Time Calculation (min) 39 min    Activity Tolerance Patient tolerated treatment well;Patient limited by fatigue    Behavior During Therapy [facility] for tasks assessed/performed           Past Medical History:  Diagnosis Date  . GERD (gastroesophageal reflux disease)   . PONV (postoperative nausea and vomiting)     Past Surgical History:  Procedure Laterality Date  . BIOPSY  05/12/2019   Procedure: BIOPSY;  Surgeon: Rogene Houston, MD;  [facility];  Service: Endoscopy;;  duodenum, antrum  . BREAST BIOPSY Left 2012   benign  . BREAST CYST ASPIRATION Bilateral   . CERVICAL SPINE SURGERY     2001  . COLONOSCOPY  07/16/2012   Procedure: COLONOSCOPY;  Surgeon: Rogene Houston, MD;  [facility];  Service: Endoscopy;  Laterality: N/A;  930  . ESOPHAGOGASTRODUODENOSCOPY N/A 05/12/2019   Procedure: ESOPHAGOGASTRODUODENOSCOPY (EGD);  Surgeon: Rogene Houston, MD;  [facility];  Service: Endoscopy;  Laterality: N/A;  245    There were no vitals filed for this visit.   Subjective Assessment - 01/11/21 1032    Subjective Patient says she is doing good today. Feels her stretches helped put her rib back in place.    Pertinent History RT plantar fasciiotomy on 06/09/20    Limitations Standing;Walking;House hold activities;Lifting    Patient Stated Goals Be  able to get up and down without difficulty/ pain    Currently in Pain? No/denies    Pain Onset More than a month ago                             [facility] Adult PT Treatment/Exercise - 01/11/21 0001      Lumbar Exercises: Stretches   Prone on Elbows Stretch 2 reps;60 seconds    Press Ups 10 reps      Lumbar Exercises: Standing   Functional Squats 20 reps   to chair for depth due   Other Standing Lumbar Exercises palloff press 20# x20 each    Other Standing Lumbar Exercises band sidestepping GTB 3RT      Lumbar Exercises: Supine   Ab Set 10 reps    Bent Knee Raise 20 reps    Dead Bug 20 reps    Bridge 20 reps    Bridge Limitations bridge with alternating leg extension x10    Straight Leg Raise 20 reps      Lumbar Exercises: Quadruped   Other Quadruped Lumbar Exercises birddog 2 x 10                    PT Short Term Goals - 12/28/20 1321      PT SHORT TERM  GOAL #1   Title Patient will be independent with initial HEP and self-management strategies to improve functional outcomes    Time 2    Period Weeks    Status New    Target Date 01/11/21             PT Long Term Goals - 12/28/20 1321      PT LONG TERM GOAL #1   Title Patient will improve lumbar extension by 25% for improved ability to perform functional mobility tasks and ADLs.    Time 4    Period Weeks    Status New    Target Date 01/25/21      PT LONG TERM GOAL #2   Title Patient will improve FOTO score by at least 8% to indicate improvement in functional outcomes    Time 4    Period Weeks    Status New    Target Date 01/25/21      PT LONG TERM GOAL #3   Title Patient will be able to perform stand x 5 in < 12 seconds to demonstrate improvement in functional mobility and reduced risk for falls.    Time 4    Period Weeks    Status New    Target Date 01/25/21      PT LONG TERM GOAL #4   Title Patient will report at least 75% overall improvement in subjective complaint to  indicate improvement in ability to perform ADLs.    Time 4    Period Weeks    Status New    Target Date 01/25/21                 Plan - 01/11/21 1112    Clinical Impression Statement Patient tolerated session well today. Able to progress hip and core strength with added band sidestepping, and machine palloff pressing. Added birddogs for core strength progressions. This was a good challenge for patient and she required verbal cues for maintaining level pelvis and avoiding hip drop. Patient reports no pain end of session. Patient will continue to benefit from skilled therapy services to progress core strength for produced pain and improved functional ability.    Examination-Activity Limitations Transfers;Stairs;Stand;Locomotion Level;Bend;Sit;Lift    Examination-Participation Restrictions Yard Work;Cleaning;Community Activity;Laundry    Stability/Clinical Decision Making Stable/Uncomplicated    Rehab Potential Good    PT Frequency 2x / week    PT Duration 4 weeks    PT Treatment/Interventions ADLs/Self Care Home Management;Aquatic Therapy;Biofeedback;Cryotherapy;Electrical Stimulation;Iontophoresis 4mg /ml Dexamethasone;Moist Heat;Traction;Balance training;Manual lymph drainage;Manual techniques;Therapeutic exercise;Therapeutic activities;Vasopneumatic Device;Taping;Splinting;Functional mobility training;Stair training;Gait training;Orthotic Fit/Training;Patient/family education;DME Instruction;Contrast Bath;Fluidtherapy;Parrafin;Ultrasound;Neuromuscular re-education;Compression bandaging;Visual/perceptual remediation/compensation;Scar mobilization;Passive range of motion;Spinal Manipulations;Dry needling;Joint Manipulations    PT Next Visit Plan Continue extension based activity. Progress core strength as tolerated. add band rows and extension, palloff walkouts    PT Home Exercise Plan Eval: iso hip abduction/ adduction, bridge, POE 3/1 press ups, ab set, supine clam 3/2 deadbug, SLR, bridge  with leg extension 3/10 birddog    Consulted and Agree with Plan of Care Patient           Patient will benefit from skilled therapeutic intervention in order to improve the following deficits and impairments:  Abnormal gait,Hypomobility,Decreased activity tolerance,Decreased strength,Increased fascial restricitons,Pain,Decreased scar mobility,Decreased balance,Decreased mobility,Difficulty walking,Decreased range of motion,Impaired flexibility,Improper body mechanics,Postural dysfunction  Visit Diagnosis: Low back pain, unspecified back pain laterality, unspecified chronicity, unspecified whether sciatica present  Sacrococcygeal disorders, not elsewhere classified     Problem List Patient Active Problem List   Diagnosis Date Noted  .  Gastroesophageal reflux disease without esophagitis 05/03/2019  . Intractable vomiting 05/03/2019  . Cyst of breast 01/07/2019  . Lipoma of axilla 01/07/2019  . Prolapse of female genital organs 01/07/2019  . Arthritis of first MTP joint 07/23/2018  . Metatarsalgia of right foot 07/23/2018  . Cervical myopathy 07/31/2016  . Abdominal hernia 04/18/2014  . Guaiac positive stools 06/15/2012  . Abdominal wall mass 06/15/2012  . GERD 10/16/2010  . EPIGASTRIC PAIN 10/16/2010    11:15 AM, 01/11/21 Josue Hector PT DPT  Physical Therapist with [facility]  [phone] [place] [address] [place], [place], [address] Phone: [phone]   Fax:  [phone]  Name: Stephanie Bishop MRN: [id number] Date of Birth: 07/16/1966

## 2021-01-11 NOTE — Patient Instructions (Signed)
Access Code: [email or web address] URL: [email or web address] Date: 01/11/2021 Prepared by: Josue Hector  Exercises Bird Dog - 2 x daily - 7 x weekly - 2 sets - 10 reps

## 2021-01-16 ENCOUNTER — Encounter (HOSPITAL_COMMUNITY): Payer: Self-pay | Admitting: Physical Therapy

## 2021-01-16 ENCOUNTER — Ambulatory Visit (HOSPITAL_COMMUNITY): Payer: 59 | Admitting: Physical Therapy

## 2021-01-16 ENCOUNTER — Other Ambulatory Visit: Payer: Self-pay

## 2021-01-16 DIAGNOSIS — M545 Low back pain, unspecified: Secondary | ICD-10-CM

## 2021-01-16 DIAGNOSIS — M533 Sacrococcygeal disorders, not elsewhere classified: Secondary | ICD-10-CM

## 2021-01-16 NOTE — Therapy (Signed)
[place], [place], [address] Phone: [phone]   Fax:  [phone]  Physical Therapy Treatment  Patient Details  Name: Stephanie Bishop MRN: [id number] Date of Birth: 1966-03-28 Referring Provider (PT): Sallee Lange MD   Encounter Date: 01/16/2021   PT End of Session - 01/16/21 1120    Visit Number 6    Number of Visits 8    Date for PT Re-Evaluation 01/25/21    Authorization Type [employer] (no auth, 30VL)    Authorization - Visit Number 9    Authorization - Number of Visits 30    PT Start Time 1118    PT Stop Time 1200    PT Time Calculation (min) 42 min    Activity Tolerance Patient tolerated treatment well    Behavior During Therapy [facility] for tasks assessed/performed           Past Medical History:  Diagnosis Date  . GERD (gastroesophageal reflux disease)   . PONV (postoperative nausea and vomiting)     Past Surgical History:  Procedure Laterality Date  . BIOPSY  05/12/2019   Procedure: BIOPSY;  Surgeon: Rogene Houston, MD;  [facility];  Service: Endoscopy;;  duodenum, antrum  . BREAST BIOPSY Left 2012   benign  . BREAST CYST ASPIRATION Bilateral   . CERVICAL SPINE SURGERY     2001  . COLONOSCOPY  07/16/2012   Procedure: COLONOSCOPY;  Surgeon: Rogene Houston, MD;  [facility];  Service: Endoscopy;  Laterality: N/A;  930  . ESOPHAGOGASTRODUODENOSCOPY N/A 05/12/2019   Procedure: ESOPHAGOGASTRODUODENOSCOPY (EGD);  Surgeon: Rogene Houston, MD;  [facility];  Service: Endoscopy;  Laterality: N/A;  245    There were no vitals filed for this visit.   Subjective Assessment - 01/16/21 1118    Subjective Patient says she did a lot yesterday. May have over did it. Working outside in her yard. A lot of walking. Her feet hurt and back a little. Ribs feel better.    Pertinent History RT plantar fasciiotomy on 06/09/20    Limitations Standing;Walking;House hold  activities;Lifting    Patient Stated Goals Be able to get up and down without difficulty/ pain    Currently in Pain? Yes    Pain Score 2     Pain Location Back    Pain Orientation Lower;Posterior    Pain Descriptors / Indicators Aching;Dull    Pain Type Chronic pain    Pain Onset More than a month ago    Pain Frequency Constant                             [facility] Adult PT Treatment/Exercise - 01/16/21 0001      Lumbar Exercises: Standing   Functional Squats 20 reps   20 reps with med ball hold red   Other Standing Lumbar Exercises palloff press GTB x 20 each, palloff walkout GTB x 10 each      Lumbar Exercises: Quadruped   Other Quadruped Lumbar Exercises birddog 2 x 10   with pvc rod for tactile cueing     Knee/Hip Exercises: Standing   Heel Raises Both;2 sets;10 reps    Hip Abduction Both;2 sets;10 reps    Abduction Limitations GTB    Hip Extension Both;2 sets;10 reps    Extension Limitations GTB    Forward Step Up Step Height: 6";2 sets;10 reps;Both  x 10 with power ups                   PT Short Term Goals - 12/28/20 1321      PT SHORT TERM GOAL #1   Title Patient will be independent with initial HEP and self-management strategies to improve functional outcomes    Time 2    Period Weeks    Status New    Target Date 01/11/21             PT Long Term Goals - 12/28/20 1321      PT LONG TERM GOAL #1   Title Patient will improve lumbar extension by 25% for improved ability to perform functional mobility tasks and ADLs.    Time 4    Period Weeks    Status New    Target Date 01/25/21      PT LONG TERM GOAL #2   Title Patient will improve FOTO score by at least 8% to indicate improvement in functional outcomes    Time 4    Period Weeks    Status New    Target Date 01/25/21      PT LONG TERM GOAL #3   Title Patient will be able to perform stand x 5 in < 12 seconds to demonstrate improvement in functional mobility and reduced risk  for falls.    Time 4    Period Weeks    Status New    Target Date 01/25/21      PT LONG TERM GOAL #4   Title Patient will report at least 75% overall improvement in subjective complaint to indicate improvement in ability to perform ADLs.    Time 4    Period Weeks    Status New    Target Date 01/25/21                 Plan - 01/16/21 1321    Clinical Impression Statement Patient progressing well. Was well challenged with ther ex progressions today. Required verbal cues and demo for proper form with added band rows and extension for posture and core strengthening. Added tactile cues for improved performance and stability with birddogs. Patient will continue to benefit from skilled therapy services to reduce limitations and improve functional ability.    Examination-Activity Limitations Transfers;Stairs;Stand;Locomotion Level;Bend;Sit;Lift    Examination-Participation Restrictions Yard Work;Cleaning;Community Activity;Laundry    Stability/Clinical Decision Making Stable/Uncomplicated    Rehab Potential Good    PT Frequency 2x / week    PT Duration 4 weeks    PT Treatment/Interventions ADLs/Self Care Home Management;Aquatic Therapy;Biofeedback;Cryotherapy;Electrical Stimulation;Iontophoresis 4mg /ml Dexamethasone;Moist Heat;Traction;Balance training;Manual lymph drainage;Manual techniques;Therapeutic exercise;Therapeutic activities;Vasopneumatic Device;Taping;Splinting;Functional mobility training;Stair training;Gait training;Orthotic Fit/Training;Patient/family education;DME Instruction;Contrast Bath;Fluidtherapy;Parrafin;Ultrasound;Neuromuscular re-education;Compression bandaging;Visual/perceptual remediation/compensation;Scar mobilization;Passive range of motion;Spinal Manipulations;Dry needling;Joint Manipulations    PT Next Visit Plan Continue extension based activity. Progress core strength as tolerated. Add band lifts and chops    PT Home Exercise Plan Eval: iso hip abduction/  adduction, bridge, POE 3/1 press ups, ab set, supine clam 3/2 deadbug, SLR, bridge with leg extension 3/10 birddog    Consulted and Agree with Plan of Care Patient           Patient will benefit from skilled therapeutic intervention in order to improve the following deficits and impairments:  Abnormal gait,Hypomobility,Decreased activity tolerance,Decreased strength,Increased fascial restricitons,Pain,Decreased scar mobility,Decreased balance,Decreased mobility,Difficulty walking,Decreased range of motion,Impaired flexibility,Improper body mechanics,Postural dysfunction  Visit Diagnosis: Low back pain, unspecified back pain laterality, unspecified chronicity, unspecified whether sciatica present  Sacrococcygeal  disorders, not elsewhere classified     Problem List Patient Active Problem List   Diagnosis Date Noted  . Gastroesophageal reflux disease without esophagitis 05/03/2019  . Intractable vomiting 05/03/2019  . Cyst of breast 01/07/2019  . Lipoma of axilla 01/07/2019  . Prolapse of female genital organs 01/07/2019  . Arthritis of first MTP joint 07/23/2018  . Metatarsalgia of right foot 07/23/2018  . Cervical myopathy 07/31/2016  . Abdominal hernia 04/18/2014  . Guaiac positive stools 06/15/2012  . Abdominal wall mass 06/15/2012  . GERD 10/16/2010  . EPIGASTRIC PAIN 10/16/2010    1:58 PM, 01/16/21 Josue Hector PT DPT  Physical Therapist with [facility]  [phone] [place] [address] [place], [place], [address] Phone: [phone]   Fax:  [phone]  Name: Stephanie Bishop MRN: [id number] Date of Birth: 1965/12/16

## 2021-01-18 ENCOUNTER — Ambulatory Visit (HOSPITAL_COMMUNITY): Payer: 59 | Admitting: Physical Therapy

## 2021-01-18 ENCOUNTER — Encounter (HOSPITAL_COMMUNITY): Payer: Self-pay | Admitting: Physical Therapy

## 2021-01-18 ENCOUNTER — Other Ambulatory Visit: Payer: Self-pay

## 2021-01-18 DIAGNOSIS — M545 Low back pain, unspecified: Secondary | ICD-10-CM | POA: Diagnosis not present

## 2021-01-18 DIAGNOSIS — M533 Sacrococcygeal disorders, not elsewhere classified: Secondary | ICD-10-CM

## 2021-01-18 NOTE — Therapy (Signed)
[place], [place], [address] Phone: [phone]   Fax:  [phone]  Physical Therapy Treatment  Patient Details  Name: Stephanie Bishop MRN: [id number] Date of Birth: [birth year] Referring Provider (PT): Sallee Lange MD   Encounter Date: 01/18/2021   PT End of Session - 01/18/21 1039    Visit Number 7    Number of Visits 8    Date for PT Re-Evaluation 01/25/21    Authorization Type [employer] (no auth, 30VL)    Authorization - Visit Number 10    Authorization - Number of Visits 30    PT Start Time 2694    PT Stop Time 1115    PT Time Calculation (min) 44 min    Activity Tolerance Patient tolerated treatment well    Behavior During Therapy [facility] for tasks assessed/performed           Past Medical History:  Diagnosis Date  . GERD (gastroesophageal reflux disease)   . PONV (postoperative nausea and vomiting)     Past Surgical History:  Procedure Laterality Date  . BIOPSY  05/12/2019   Procedure: BIOPSY;  Surgeon: Rogene Houston, MD;  [facility];  Service: Endoscopy;;  duodenum, antrum  . BREAST BIOPSY Left 2012   benign  . BREAST CYST ASPIRATION Bilateral   . CERVICAL SPINE SURGERY     2001  . COLONOSCOPY  07/16/2012   Procedure: COLONOSCOPY;  Surgeon: Rogene Houston, MD;  [facility];  Service: Endoscopy;  Laterality: N/A;  930  . ESOPHAGOGASTRODUODENOSCOPY N/A 05/12/2019   Procedure: ESOPHAGOGASTRODUODENOSCOPY (EGD);  Surgeon: Rogene Houston, MD;  [facility];  Service: Endoscopy;  Laterality: N/A;  245    There were no vitals filed for this visit.   Subjective Assessment - 01/18/21 1035    Subjective Patetin says her back is fair, her foot is not happy.    Pertinent History RT plantar fasciiotomy on 06/09/20    Limitations Standing;Walking;House hold activities;Lifting    Patient Stated Goals Be able to get up and down without difficulty/ pain    Currently  in Pain? Yes    Pain Score 1     Pain Location Back    Pain Orientation Posterior;Lower    Pain Descriptors / Indicators Aching    Pain Type Chronic pain    Pain Onset More than a month ago    Pain Frequency Constant    Aggravating Factors  a lot of lifting    Pain Relieving Factors exercises    Effect of Pain on Daily Activities Limits                             [facility] Adult PT Treatment/Exercise - 01/18/21 0001      Lumbar Exercises: Standing   Functional Squats 20 reps    Functional Squats Limitations with yellow med ball holds    Row 20 reps;Theraband    Theraband Level (Row) Level 3 (Green)    Other Standing Lumbar Exercises palloff press GTB x 20 each, palloff walkout GTB x 10 each, GTB lifts and chops x 10 each      Lumbar Exercises: Sidelying   Other Sidelying Lumbar Exercises side plank from knees 3 x 20" each      Lumbar Exercises: Prone   Other Prone Lumbar Exercises prone pres ups 2 x 10      Knee/Hip Exercises:  Standing   Heel Raises Both;2 sets;10 reps    Hip Abduction Both;2 sets;10 reps    Abduction Limitations GTB    Hip Extension Both;2 sets;10 reps    Extension Limitations GTB    Forward Step Up Step Height: 6";2 sets;10 reps;Both   with power up                   PT Short Term Goals - 12/28/20 1321      PT SHORT TERM GOAL #1   Title Patient will be independent with initial HEP and self-management strategies to improve functional outcomes    Time 2    Period Weeks    Status New    Target Date 01/11/21             PT Long Term Goals - 12/28/20 1321      PT LONG TERM GOAL #1   Title Patient will improve lumbar extension by 25% for improved ability to perform functional mobility tasks and ADLs.    Time 4    Period Weeks    Status New    Target Date 01/25/21      PT LONG TERM GOAL #2   Title Patient will improve FOTO score by at least 8% to indicate improvement in functional outcomes    Time 4    Period Weeks     Status New    Target Date 01/25/21      PT LONG TERM GOAL #3   Title Patient will be able to perform stand x 5 in < 12 seconds to demonstrate improvement in functional mobility and reduced risk for falls.    Time 4    Period Weeks    Status New    Target Date 01/25/21      PT LONG TERM GOAL #4   Title Patient will report at least 75% overall improvement in subjective complaint to indicate improvement in ability to perform ADLs.    Time 4    Period Weeks    Status New    Target Date 01/25/21                 Plan - 01/18/21 1113    Clinical Impression Statement Patient tolerated session well overall today. Patient well challenged with ther ex progressions. Shows improved form with band rows but required verbal cues to avoid scapular elevation, and cues for core engagement. Added side planks and band lifts and chops for core strength progressions. Patient noted increased muscle fatigue. Issued updated HEP handout. Patient will continue to benefit from skilled therapy services to progress core to reduce back pain and improve LOF with ADLs. Reassess next visit.    Examination-Activity Limitations Transfers;Stairs;Stand;Locomotion Level;Bend;Sit;Lift    Examination-Participation Restrictions Yard Work;Cleaning;Community Activity;Laundry    Stability/Clinical Decision Making Stable/Uncomplicated    Rehab Potential Good    PT Frequency 2x / week    PT Duration 4 weeks    PT Treatment/Interventions ADLs/Self Care Home Management;Aquatic Therapy;Biofeedback;Cryotherapy;Electrical Stimulation;Iontophoresis 4mg /ml Dexamethasone;Moist Heat;Traction;Balance training;Manual lymph drainage;Manual techniques;Therapeutic exercise;Therapeutic activities;Vasopneumatic Device;Taping;Splinting;Functional mobility training;Stair training;Gait training;Orthotic Fit/Training;Patient/family education;DME Instruction;Contrast Bath;Fluidtherapy;Parrafin;Ultrasound;Neuromuscular re-education;Compression  bandaging;Visual/perceptual remediation/compensation;Scar mobilization;Passive range of motion;Spinal Manipulations;Dry needling;Joint Manipulations    PT Next Visit Plan Reassess next visit    PT Home Exercise Plan Eval: iso hip abduction/ adduction, bridge, POE 3/1 press ups, ab set, supine clam 3/2 deadbug, SLR, bridge with leg extension 3/10 birddog 3/17 band rows, side planks    Consulted and Agree with Plan of Care Patient  Patient will benefit from skilled therapeutic intervention in order to improve the following deficits and impairments:  Abnormal gait,Hypomobility,Decreased activity tolerance,Decreased strength,Increased fascial restricitons,Pain,Decreased scar mobility,Decreased balance,Decreased mobility,Difficulty walking,Decreased range of motion,Impaired flexibility,Improper body mechanics,Postural dysfunction  Visit Diagnosis: Low back pain, unspecified back pain laterality, unspecified chronicity, unspecified whether sciatica present  Sacrococcygeal disorders, not elsewhere classified     Problem List Patient Active Problem List   Diagnosis Date Noted  . Gastroesophageal reflux disease without esophagitis 05/03/2019  . Intractable vomiting 05/03/2019  . Cyst of breast 01/07/2019  . Lipoma of axilla 01/07/2019  . Prolapse of female genital organs 01/07/2019  . Arthritis of first MTP joint 07/23/2018  . Metatarsalgia of right foot 07/23/2018  . Cervical myopathy 07/31/2016  . Abdominal hernia 04/18/2014  . Guaiac positive stools 06/15/2012  . Abdominal wall mass 06/15/2012  . GERD 10/16/2010  . EPIGASTRIC PAIN 10/16/2010   11:20 AM, 01/18/21 Josue Hector PT DPT  Physical Therapist with [facility]  [phone] [place] [address] [place], [place], [address] Phone: [phone]   Fax:  [phone]  Name: Stephanie Bishop MRN: [id number] Date of Birth: 08-20-1966

## 2021-01-18 NOTE — Patient Instructions (Signed)
Access Code: [email or web address] URL: [email or web address] Date: 01/18/2021 Prepared by: Josue Hector  Exercises Standing Shoulder Row with Anchored Resistance - 1-2 x daily - 7 x weekly - 2 sets - 10 reps Side Plank on Knees - 1-2 x daily - 7 x weekly - 1 sets - 3 reps - 20 seconds hold

## 2021-01-23 ENCOUNTER — Ambulatory Visit (HOSPITAL_COMMUNITY): Payer: 59 | Admitting: Physical Therapy

## 2021-01-23 ENCOUNTER — Encounter (HOSPITAL_COMMUNITY): Payer: Self-pay | Admitting: Physical Therapy

## 2021-01-23 ENCOUNTER — Other Ambulatory Visit: Payer: Self-pay

## 2021-01-23 DIAGNOSIS — M533 Sacrococcygeal disorders, not elsewhere classified: Secondary | ICD-10-CM

## 2021-01-23 DIAGNOSIS — M545 Low back pain, unspecified: Secondary | ICD-10-CM

## 2021-01-23 NOTE — Therapy (Signed)
[facility] [address] [place], [place], [address] Phone: [phone]   Fax:  [phone]  Physical Therapy Treatment  Patient Details  Name: Stephanie Bishop MRN: [id number] Date of Birth: [birth year] Referring Provider (PT): Sallee Lange MD  PHYSICAL THERAPY DISCHARGE SUMMARY  Visits from Start of Care: 8  Current functional level related to goals / functional outcomes: See below    Remaining deficits: See below    Education / Equipment: See assessment  Plan: Patient agrees to discharge.  Patient goals were met. Patient is being discharged due to meeting the stated rehab goals.  ?????      Encounter Date: 01/23/2021   PT End of Session - 01/23/21 1127    Visit Number 8    Number of Visits 8    Date for PT Re-Evaluation 01/25/21    Authorization Type [employer] (no auth, 30VL)    Authorization - Visit Number 10    Authorization - Number of Visits 30    PT Start Time 1120    PT Stop Time 1205    PT Time Calculation (min) 45 min    Activity Tolerance Patient tolerated treatment well    Behavior During Therapy WFL for tasks assessed/performed           Past Medical History:  Diagnosis Date  . GERD (gastroesophageal reflux disease)   . PONV (postoperative nausea and vomiting)     Past Surgical History:  Procedure Laterality Date  . BIOPSY  05/12/2019   Procedure: BIOPSY;  Surgeon: Rogene Houston, MD;  [facility];  Service: Endoscopy;;  duodenum, antrum  . BREAST BIOPSY Left 2012   benign  . BREAST CYST ASPIRATION Bilateral   . CERVICAL SPINE SURGERY     2001  . COLONOSCOPY  07/16/2012   Procedure: COLONOSCOPY;  Surgeon: Rogene Houston, MD;  [facility];  Service: Endoscopy;  Laterality: N/A;  930  . ESOPHAGOGASTRODUODENOSCOPY N/A 05/12/2019   Procedure: ESOPHAGOGASTRODUODENOSCOPY (EGD);  Surgeon: Rogene Houston, MD;  [facility];  Service: Endoscopy;  Laterality: N/A;   245    There were no vitals filed for this visit.   Subjective Assessment - 01/23/21 1126    Subjective Patient says "its going ok". Says pain has improved. She reports 80% improvement since starting therapy. She says as long as she does her exercises she feels ok.    Pertinent History RT plantar fasciiotomy on 06/09/20    Limitations Standing;Walking;House hold activities;Lifting    Patient Stated Goals Be able to get up and down without difficulty/ pain    Currently in Pain? Yes    Pain Score 1     Pain Location Back    Pain Orientation Lower;Posterior    Pain Descriptors / Indicators Aching    Pain Type Chronic pain    Pain Onset More than a month ago    Pain Frequency Constant    Aggravating Factors  bending, lifting    Pain Relieving Factors exercises    Effect of Pain on Daily Activities Limits              [facility] PT Assessment - 01/23/21 0001      Assessment   Medical Diagnosis LBP    Referring Provider (PT) Sallee Lange MD    Prior Therapy Yes      Precautions   Precautions None      Restrictions   Weight Bearing Restrictions No  Balance Screen   Has the patient fallen in the past 6 months No      [place] residence      Prior Function   Level of Independence Independent      Cognition   Overall Cognitive Status Within Functional Limits for tasks assessed      Observation/Other Assessments   Focus on Therapeutic Outcomes (FOTO)  73% function   was 70%     AROM   Lumbar Flexion WFL    Lumbar Extension 30% limited   was 60%   Lumbar - Right Side Bend WFL   was 15%   Lumbar - Left Side Bend WFL   was 15%     Strength   Right Hip Flexion 5/5   was 4+   Right Hip Extension 4+/5   was 4   Right Hip ABduction 4+/5   was 4   Left Hip Flexion 5/5    Left Hip Extension 4+/5   was 4   Left Hip ABduction 4+/5   was 4     Transfers   Five time sit to stand comments  10.6 sec with no UEs                                  PT Education - 01/23/21 1359    Education Details on progress to therapy goals, proper lifting mechanics, pacing activity and exercise program, updated HEP handout    Person(s) Educated Patient    Methods Explanation;Handout    Comprehension Verbalized understanding            PT Short Term Goals - 01/23/21 1153      PT SHORT TERM GOAL #1   Title Patient will be independent with initial HEP and self-management strategies to improve functional outcomes    Baseline Reports compliance and demos good return    Time 2    Period Weeks    Status Achieved    Target Date 01/11/21             PT Long Term Goals - 01/23/21 1154      PT LONG TERM GOAL #1   Title Patient will improve lumbar extension by 25% for improved ability to perform functional mobility tasks and ADLs.    Baseline See AROM    Time 4    Period Weeks    Status Achieved      PT LONG TERM GOAL #2   Title Patient will improve FOTO score by at least 8% to indicate improvement in functional outcomes    Baseline Improved by 3%    Time 4    Period Weeks    Status Partially Met      PT LONG TERM GOAL #3   Title Patient will be able to perform stand x 5 in < 12 seconds to demonstrate improvement in functional mobility and reduced risk for falls.    Baseline Current 10.6 sec    Time 4    Period Weeks    Status Achieved      PT LONG TERM GOAL #4   Title Patient will report at least 75% overall improvement in subjective complaint to indicate improvement in ability to perform ADLs.    Baseline Reports 80%    Time 4    Period Weeks    Status Achieved  Plan - 01/23/21 1354    Clinical Impression Statement Patient has made good progress toward therapy goals. Patient shows improved mobility, strength and activity tolerance. Patient reports low level pain related to increased lifting and activity which would be considered normal. Discussed this with  patient and on pacing activity, also reviewed lifting mechanics as patients pain seems directly related to increased bending/ lifting activity working in her yard etc. She states she feels she has the tools and exercises to improve symptoms when they are flared, and feels more confident in her abilities. Reviewed HEP and encouraged patient to follow up with therapy services with any further questions or concerns. Patient being DC from therapy with all goals met/ partially met.    Examination-Activity Limitations Transfers;Stairs;Stand;Locomotion Level;Bend;Sit;Lift    Examination-Participation Restrictions Yard Work;Cleaning;Community Activity;Laundry    Stability/Clinical Decision Making Stable/Uncomplicated    Rehab Potential Good    PT Treatment/Interventions ADLs/Self Care Home Management;Aquatic Therapy;Biofeedback;Cryotherapy;Electrical Stimulation;Iontophoresis 28m/ml Dexamethasone;Moist Heat;Traction;Balance training;Manual lymph drainage;Manual techniques;Therapeutic exercise;Therapeutic activities;Vasopneumatic Device;Taping;Splinting;Functional mobility training;Stair training;Gait training;Orthotic Fit/Training;Patient/family education;DME Instruction;Contrast Bath;Fluidtherapy;Parrafin;Ultrasound;Neuromuscular re-education;Compression bandaging;Visual/perceptual remediation/compensation;Scar mobilization;Passive range of motion;Spinal Manipulations;Dry needling;Joint Manipulations    PT Next Visit Plan DC to HEP    PT Home Exercise Plan Eval: iso hip abduction/ adduction, bridge, POE 3/1 press ups, ab set, supine clam 3/2 deadbug, SLR, bridge with leg extension 3/10 birddog 3/17 band rows, side planks    Consulted and Agree with Plan of Care Patient           Patient will benefit from skilled therapeutic intervention in order to improve the following deficits and impairments:  Abnormal gait,Hypomobility,Decreased activity tolerance,Decreased strength,Increased fascial  restricitons,Pain,Decreased scar mobility,Decreased balance,Decreased mobility,Difficulty walking,Decreased range of motion,Impaired flexibility,Improper body mechanics,Postural dysfunction  Visit Diagnosis: Low back pain, unspecified back pain laterality, unspecified chronicity, unspecified whether sciatica present  Sacrococcygeal disorders, not elsewhere classified     Problem List Patient Active Problem List   Diagnosis Date Noted  . Gastroesophageal reflux disease without esophagitis 05/03/2019  . Intractable vomiting 05/03/2019  . Cyst of breast 01/07/2019  . Lipoma of axilla 01/07/2019  . Prolapse of female genital organs 01/07/2019  . Arthritis of first MTP joint 07/23/2018  . Metatarsalgia of right foot 07/23/2018  . Cervical myopathy 07/31/2016  . Abdominal hernia 04/18/2014  . Guaiac positive stools 06/15/2012  . Abdominal wall mass 06/15/2012  . GERD 10/16/2010  . EPIGASTRIC PAIN 10/16/2010   2:03 PM, 01/23/21 CJosue HectorPT DPT  Physical Therapist [facility] ([phone]  [facility] [address]Phone: [phone]  Fax:  [phone] Name: LCAIRA POCHEMRN: [id number] of Birth: 102-Jun-1967

## 2021-01-23 NOTE — Patient Instructions (Signed)
Access Code: [email or web address] URL: [email or web address] Date: 01/23/2021 Prepared by: Josue Hector  Exercises Standing Anti-Rotation Press with Anchored Resistance - 1 x daily - 3 x weekly - 2 sets - 10 reps Standing Diagonal Lift with Anchored Resistance - 1 x daily - 3 x weekly - 2 sets - 10 reps Standing Diagonal Chop - 1 x daily - 3 x weekly - 2 sets - 10 reps

## 2021-01-24 ENCOUNTER — Encounter: Payer: Self-pay | Admitting: Family Medicine

## 2021-01-24 ENCOUNTER — Ambulatory Visit (HOSPITAL_COMMUNITY)
Admission: RE | Admit: 2021-01-24 | Discharge: 2021-01-24 | Disposition: A | Discharge status: Home / Self Care | Payer: 59 | Source: Ambulatory Visit | Attending: Family Medicine | Admitting: Family Medicine

## 2021-01-24 ENCOUNTER — Ambulatory Visit (INDEPENDENT_AMBULATORY_CARE_PROVIDER_SITE_OTHER): Payer: 59 | Admitting: Family Medicine

## 2021-01-24 VITALS — BP 142/92 | HR 98 | Temp 97.3°F | Ht 63.0 in | Wt 165.0 lb

## 2021-01-24 DIAGNOSIS — I10 Essential (primary) hypertension: Secondary | ICD-10-CM

## 2021-01-24 DIAGNOSIS — R079 Chest pain, unspecified: Secondary | ICD-10-CM | POA: Diagnosis not present

## 2021-01-24 DIAGNOSIS — M545 Low back pain, unspecified: Secondary | ICD-10-CM | POA: Diagnosis not present

## 2021-01-24 MED ORDER — AMLODIPINE BESYLATE 2.5 MG PO TABS: 2.5000 mg | ORAL_TABLET | Freq: Every day | ORAL | 3 refills | Status: DC

## 2021-01-24 NOTE — Progress Notes (Signed)
   Subjective:    Patient ID: Stephanie Bishop, female    DOB: 1965-11-26, 55 y.o.   MRN: [id number]  HPIfollow up on low back pain. Pt states doing better since doing PT. Has had 8 sessions. Pt would like to have a xray of back even though she is doing better.  Patient with significant low back pain but is doing much better since doing the physical therapy occasionally gets down the right leg and into the right foot.  Denies any new injuries.  Does not wake her up at night.  No weight loss fevers or sweats  She is noted over the past several months increased blood pressure compared to where it normally is. She is working hard on trying to eat somewhat healthier.  She also relates some rib pain on the left side underneath the axilla toward the scapula complains of pain discomfort hurts with certain movements and sneezes and coughs been going on for several weeks.  Tried anti-inflammatory helps some but it has persisted does not recall any falls  Review of Systems  Constitutional: Negative for activity change and appetite change.  HENT: Negative for congestion and rhinorrhea.   Respiratory: Negative for cough and shortness of breath.   Cardiovascular: Negative for chest pain and leg swelling.  Gastrointestinal: Negative for abdominal pain, nausea and vomiting.  Musculoskeletal: Positive for back pain. Negative for arthralgias.  Skin: Negative for color change.  Neurological: Negative for dizziness and weakness.  Psychiatric/Behavioral: Negative for agitation and confusion.       Objective:   Physical Exam Vitals reviewed.  Constitutional:      General: She is not in acute distress. HENT:     Head: Normocephalic.  Cardiovascular:     Rate and Rhythm: Normal rate and regular rhythm.     Heart sounds: Normal heart sounds. No murmur heard.   Pulmonary:     Effort: Pulmonary effort is normal.     Breath sounds: Normal breath sounds.  Lymphadenopathy:     Cervical: No cervical  adenopathy.  Neurological:     Mental Status: She is alert.  Psychiatric:        Behavior: Behavior normal.    Tenderness along the rib line mid ribs on the left side underneath the scapular region into the mid back axilla region no masses are felt       Assessment & Plan:  Low back pain with occasional sciatica down the right leg good strength good reflexes patient did see improvement with physical therapy we will do plain x-rays may potentially need to have MRI down the road if it gets worse patient will keep [place] posted  Blood pressure elevated start amlodipine low-dose 2.5 mg daily patient hesitant to be on blood pressure medicine but is open to getting her numbers looking better she will work hard on a healthier diet and getting in some activity as her back allows  Rib pain and discomfort on the left side chest x-ray to make sure there is not underlying lung issue since she does not have any history of injury but also to x-rays of the ribs await results  Recheck blood pressure in 6 weeks patient to give [place] update as needed

## 2021-01-25 ENCOUNTER — Encounter (HOSPITAL_COMMUNITY): Payer: 59 | Admitting: Physical Therapy

## 2021-01-26 ENCOUNTER — Encounter: Payer: Self-pay | Admitting: Family Medicine

## 2021-03-07 ENCOUNTER — Encounter: Payer: Self-pay | Admitting: Family Medicine

## 2021-03-07 ENCOUNTER — Other Ambulatory Visit: Payer: Self-pay

## 2021-03-07 ENCOUNTER — Ambulatory Visit (INDEPENDENT_AMBULATORY_CARE_PROVIDER_SITE_OTHER): Payer: 59 | Admitting: Family Medicine

## 2021-03-07 VITALS — BP 136/88 | Temp 97.2°F | Ht 63.0 in | Wt 171.2 lb

## 2021-03-07 DIAGNOSIS — I10 Essential (primary) hypertension: Secondary | ICD-10-CM | POA: Diagnosis not present

## 2021-03-07 DIAGNOSIS — R5383 Other fatigue: Secondary | ICD-10-CM

## 2021-03-07 DIAGNOSIS — M545 Low back pain, unspecified: Secondary | ICD-10-CM

## 2021-03-07 DIAGNOSIS — R635 Abnormal weight gain: Secondary | ICD-10-CM | POA: Diagnosis not present

## 2021-03-07 MED ORDER — AMLODIPINE BESYLATE 2.5 MG PO TABS: 2.5000 mg | ORAL_TABLET | Freq: Every day | ORAL | 1 refills | Status: DC

## 2021-03-07 NOTE — Progress Notes (Signed)
   Subjective:    Patient ID: Stephanie Bishop, female    DOB: 09-22-1966, 55 y.o.   MRN: [id number]  Hypertension This is a chronic problem. The current episode started more than 1 year ago. Treatments tried: norvasc. There are no compliance problems.    Patient relates ongoing low back pain discomfort radiates into the hips makes it very difficult to get around denies weakness but states pain levels inhibit activity unable to do much walking  Mild weight gain due to inactivity and dietary indiscretion   Review of Systems     Objective:   Physical Exam Vitals reviewed.  Constitutional:      Appearance: She is well-developed.  HENT:     Head: Normocephalic.  Cardiovascular:     Rate and Rhythm: Normal rate and regular rhythm.     Heart sounds: Normal heart sounds. No murmur heard.   Pulmonary:     Effort: Pulmonary effort is normal.     Breath sounds: Normal breath sounds.  Skin:    General: Skin is warm and dry.  Neurological:     Mental Status: She is alert.           Assessment & Plan:  1. Primary hypertension Blood pressure decent control continue medication watch diet stay active try to lose weight - Comprehensive metabolic panel - Lipid panel  2. Lumbar pain Continue stretching exercises shown to her by physical therapy give [place] update in 4 to 6 weeks if ongoing troubles consider discussion of MRI versus appointment with orthopedic back specialist  3. Weight gain Healthy eating regular activity recommended  4. Other fatigue Labs ordered await results - Comprehensive metabolic panel - TSH Recheck by fall time

## 2021-03-15 LAB — COMPREHENSIVE METABOLIC PANEL
ALT: 22 IU/L (ref 0–32)
AST: 18 IU/L (ref 0–40)
Albumin/Globulin Ratio: 1.8 (ref 1.2–2.2)
Albumin: 4.4 g/dL (ref 3.8–4.9)
Alkaline Phosphatase: 119 IU/L (ref 44–121)
BUN/Creatinine Ratio: 15 (ref 9–23)
BUN: 14 mg/dL (ref 6–24)
Bilirubin Total: 0.3 mg/dL (ref 0.0–1.2)
CO2: 26 mmol/L (ref 20–29)
Calcium: 10.2 mg/dL (ref 8.7–10.2)
Chloride: 104 mmol/L (ref 96–106)
Creatinine, Ser: 0.91 mg/dL (ref 0.57–1.00)
Globulin, Total: 2.5 g/dL (ref 1.5–4.5)
Glucose: 95 mg/dL (ref 65–99)
Potassium: 4.7 mmol/L (ref 3.5–5.2)
Sodium: 144 mmol/L (ref 134–144)
Total Protein: 6.9 g/dL (ref 6.0–8.5)
eGFR: 75 mL/min/{1.73_m2} (ref >59)

## 2021-03-15 LAB — LIPID PANEL
Chol/HDL Ratio: 1.8 ratio (ref 0.0–4.4)
Cholesterol, Total: 133 mg/dL (ref 100–199)
HDL: 76 mg/dL (ref >39)
LDL Chol Calc (NIH): 45 mg/dL (ref 0–99)
Triglycerides: 54 mg/dL (ref 0–149)
VLDL Cholesterol Cal: 12 mg/dL (ref 5–40)

## 2021-03-15 LAB — TSH: TSH: 3.5 u[IU]/mL (ref 0.450–4.500)

## 2021-05-05 ENCOUNTER — Ambulatory Visit
Admission: EM | Admit: 2021-05-05 | Discharge: 2021-05-05 | Disposition: A | Discharge status: Home / Self Care | Payer: 59 | Attending: Emergency Medicine | Admitting: Emergency Medicine

## 2021-05-05 DIAGNOSIS — H9203 Otalgia, bilateral: Secondary | ICD-10-CM

## 2021-05-05 MED ORDER — ACETIC ACID 2 % OT SOLN: 4.0000 [drp] | Freq: Three times a day (TID) | OTIC | 0 refills | Status: DC

## 2021-05-05 NOTE — ED Triage Notes (Signed)
Patient presents to Urgent Care with complaints of bilateral ear drainage and ear pain x 1 week. She states she went swimming believes this is the cause of the ear pain. Treating pain with Tylenol.   Denies fever.

## 2021-05-05 NOTE — ED Provider Notes (Signed)
[facility]   [id number] 05/05/21 Arrival Time: [place]  SUBJECTIVE: History from: patient.  Stephanie Bishop is a 55 y.o. female who presents with of bilateral ear pain and drainage x 1 week.  Admits to swimming in pond prior to symptoms.  Patient states the pain is intermittent and achy in character.  Patient has tried OTC medications without relief.  Symptoms are made worse with lying down.  Reports similar symptoms in the past.  Complains of sore throat.  Denies fever, chills, fatigue, sinus pain, rhinorrhea, SOB, wheezing, chest pain, nausea, changes in bowel or bladder habits.    ROS: As per HPI.  All other pertinent ROS negative.     Past Medical History:  Diagnosis Date   GERD (gastroesophageal reflux disease)    PONV (postoperative nausea and vomiting)    Past Surgical History:  Procedure Laterality Date   BIOPSY  05/12/2019   Procedure: BIOPSY;  Surgeon: Rogene Houston, MD;  [facility];  Service: Endoscopy;;  duodenum, antrum   BREAST BIOPSY Left 2012   benign   BREAST CYST ASPIRATION Bilateral    CERVICAL SPINE SURGERY     2001   COLONOSCOPY  07/16/2012   Procedure: COLONOSCOPY;  Surgeon: Rogene Houston, MD;  [facility];  Service: Endoscopy;  Laterality: N/A;  930   ESOPHAGOGASTRODUODENOSCOPY N/A 05/12/2019   Procedure: ESOPHAGOGASTRODUODENOSCOPY (EGD);  Surgeon: Rogene Houston, MD;  [facility];  Service: Endoscopy;  Laterality: N/A;  245   Allergies  Allergen Reactions   Prednisone     Gives her migraines   Ciprofloxacin Other (See Comments)    Knots on Face    No current facility-administered medications on file prior to encounter.   Current Outpatient Medications on File Prior to Encounter  Medication Sig Dispense Refill   amLODipine (NORVASC) 2.5 MG tablet Take 1 tablet (2.5 mg total) by mouth daily. 90 tablet 1   cetirizine (ZYRTEC) 10 MG tablet Take 10 mg by mouth daily as needed for allergies.       fluticasone (FLONASE) 50 MCG/ACT nasal spray USE TWO SPRAY(S) IN EACH NOSTRIL ONCE DAILY (Patient taking differently: Place 1-2 sprays into both nostrils daily as needed for allergies.) 16 g 6   pantoprazole (PROTONIX) 40 MG tablet TAKE 1 TABLET BY MOUTH TWICE DAILY BEFORE A MEAL - MAKE AN APPT PRIOR TO REFILL RUNNING OUT 180 tablet 0   Social History   Socioeconomic History   Marital status: Married    Spouse name: Not on file   Number of children: Not on file   Years of education: Not on file   Highest education level: Not on file  Occupational History   Not on file  Tobacco Use   Smoking status: Never   Smokeless tobacco: Never  Vaping Use   Vaping Use: Never used  Substance and Sexual Activity   Alcohol use: No   Drug use: No   Sexual activity: Not on file  Other Topics Concern   Not on file  Social History Narrative   Not on file   Social Determinants of Health   Financial Resource Strain: Not on file  Food Insecurity: Not on file  Transportation Needs: Not on file  Physical Activity: Not on file  Stress: Not on file  Social Connections: Not on file  Intimate Partner Violence: Not on file   Family History  Problem Relation Age of Onset   Colon cancer Neg Hx  OBJECTIVE:  Vitals:   05/05/21 0957  BP: (S) (!) 156/83  Pulse: (S) (!) 106  Resp: 16  Temp: 98.6 F (37 C)  TempSrc: Oral  SpO2: 97%     General appearance: alert; well-appearing, nontoxic; speaking in full sentences and tolerating own secretions HEENT: NCAT; Ears: EACs clear, TMs pearly gray, LT TM mildly injected; Eyes: PERRL.  EOM grossly intact.Nose: nares patent without rhinorrhea, Throat: oropharynx clear, tonsils non erythematous or enlarged, uvula midline  Neck: supple without LAD Lungs: unlabored respirations, symmetrical air entry; cough: absent; no respiratory distress; CTAB Heart: regular rate and rhythm.  Skin: warm and dry Psychological: alert and cooperative; normal mood and  affect   ASSESSMENT & PLAN:  1. Acute ear pain, bilateral     Meds ordered this encounter  Medications   acetic acid 2 % otic solution    Sig: Place 4 drops into the left ear 3 (three) times daily.    Dispense:  15 mL    Refill:  0    Order Specific Question:   Supervising Provider    Answer:   Raylene Everts [[id number]]    Rest and drink plenty of fluids Acetic acid ear drops prescribed Use as directed Continue to use OTC ibuprofen and/ or tylenol as needed for pain control Follow up with PCP if symptoms persists Return here or go to the ER if you have any new or worsening symptoms fever, chills, nausea, vomiting, redness, swelling, drainage, etc...  Reviewed expectations re: course of current medical issues. Questions answered. Outlined signs and symptoms indicating need for more acute intervention. Patient verbalized understanding. After Visit Summary given.          Lestine Box, PA-C 05/05/21 1004

## 2021-05-05 NOTE — Discharge Instructions (Addendum)
Rest and drink plenty of fluids Acetic acid ear drops prescribed Use as directed Continue to use OTC ibuprofen and/ or tylenol as needed for pain control Follow up with PCP if symptoms persists Return here or go to the ER if you have any new or worsening symptoms fever, chills, nausea, vomiting, redness, swelling, drainage, etc..Marland Kitchen

## 2021-06-30 ENCOUNTER — Encounter: Payer: Self-pay | Admitting: Family Medicine

## 2021-07-10 ENCOUNTER — Ambulatory Visit (INDEPENDENT_AMBULATORY_CARE_PROVIDER_SITE_OTHER): Payer: 59 | Admitting: Internal Medicine

## 2021-07-10 ENCOUNTER — Encounter (INDEPENDENT_AMBULATORY_CARE_PROVIDER_SITE_OTHER): Payer: Self-pay | Admitting: Internal Medicine

## 2021-07-10 ENCOUNTER — Other Ambulatory Visit: Payer: Self-pay

## 2021-07-10 VITALS — BP 119/80 | HR 93 | Temp 99.1°F | Ht 63.0 in | Wt 169.0 lb

## 2021-07-10 DIAGNOSIS — K219 Gastro-esophageal reflux disease without esophagitis: Secondary | ICD-10-CM

## 2021-07-10 NOTE — Patient Instructions (Addendum)
Consider taking Pepcid/famotidine OTC 20 mg in the evening in place of second dose of pantoprazole. If if this combination does not work can continue taking pantoprazole like before. Physician will call with results of blood work

## 2021-07-10 NOTE — Progress Notes (Signed)
Presenting complaint;  Follow for chronic GERD.  Database and subjective:  Patient is 55 year old Caucasian female with chronic GERD who is here for yearly visit.  She did have EGD in July 2020 which revealed normal mucosa of the esophagus small sliding hiatal hernia.  She had focal gastritis but biopsy was negative for H. pylori infection and duodenal biopsy did not show any changes of celiac disease.  Because of persistent symptoms she also had gastric emptying study in August 2020 and was within normal limits.  Patient says she is doing well.  She has heartburn on most days when she does not watch her diet like she should.  On days when she is more careful with the diet she only takes 1 pantoprazole and on the other she takes 2.  She says her heartburn is triggered by caffeine chocolate and tomato-based foods.  She denies dysphagia hoarseness chronic cough or sore throat but at times as feeling of lump in her throat which resolved spontaneously.  She denies nausea or vomiting. She eats her supper at 5 PM but does not go to bed until 1 AM.  She says her bowels move daily.  She may notice blood on the tissue occasionally when she is constipated.  She feels she is getting enough fiber in her diet. She does not exercise and averages less than 2000 steps per day.  She says she had normal vitamin D2 level by her gynecologist. She has a history of osteopenia and is scheduled to have bone density study either later this year or early next year. She is not having any side effects with pantoprazole.  Current Medications: Outpatient Encounter Medications as of 07/10/2021  Medication Sig   amLODipine (NORVASC) 2.5 MG tablet Take 1 tablet (2.5 mg total) by mouth daily.   cetirizine (ZYRTEC) 10 MG tablet Take 10 mg by mouth daily as needed for allergies.    fluticasone (FLONASE) 50 MCG/ACT nasal spray USE TWO SPRAY(S) IN EACH NOSTRIL ONCE DAILY (Patient taking differently: Place 1-2 sprays into both nostrils  daily as needed for allergies.)   pantoprazole (PROTONIX) 40 MG tablet TAKE 1 TABLET BY MOUTH TWICE DAILY BEFORE A MEAL - MAKE AN APPT PRIOR TO REFILL RUNNING OUT   [DISCONTINUED] acetic acid 2 % otic solution Place 4 drops into the left ear 3 (three) times daily.   No facility-administered encounter medications on file as of 07/10/2021.    Objective: Blood pressure 119/80, pulse 93, temperature 99.1 F (37.3 C), height '5\' 3"'  (1.6 m), weight 169 lb (76.7 kg), last menstrual period 10/11/2018. Patient is alert and in no acute distress. Conjunctiva is pink. Sclera is nonicteric Oropharyngeal mucosa is normal. Dentition in satisfactory condition. No neck masses or thyromegaly noted. Cardiac exam with regular rhythm normal S1 and S2. No murmur or gallop noted. Lungs are clear to auscultation. Abdomen symmetrical soft and nontender with organomegaly or masses. No LE edema or clubbing noted.  Labs/studies Results:   CBC Latest Ref Rng & Units 05/03/2019 04/20/2019 07/10/2018  WBC 3.8 - 10.8 Thousand/uL 5.0 7.5 5.7  Hemoglobin 11.7 - 15.5 g/dL 15.6(H) 14.3 14.7  Hematocrit 35.0 - 45.0 % 47.2(H) 44.3 45.5(H)  Platelets 140 - 400 Thousand/uL 300 261 272    CMP Latest Ref Rng & Units 03/14/2021 06/04/2019 05/03/2019  Glucose 65 - 99 mg/dL 95 - -  BUN 6 - 24 mg/dL 14 - -  Creatinine 0.57 - 1.00 mg/dL 0.91 1.00 -  Sodium 134 - 144 mmol/L 144 - -  Potassium 3.5 - 5.2 mmol/L 4.7 - -  Chloride 96 - 106 mmol/L 104 - -  CO2 20 - 29 mmol/L 26 - -  Calcium 8.7 - 10.2 mg/dL 10.2 - -  Total Protein 6.0 - 8.5 g/dL 6.9 - 7.3  Total Bilirubin 0.0 - 1.2 mg/dL 0.3 - 0.4  Alkaline Phos 44 - 121 IU/L 119 - -  AST 0 - 40 IU/L 18 - 20  ALT 0 - 32 IU/L 22 - 16    Hepatic Function Latest Ref Rng & Units 03/14/2021 05/03/2019 04/20/2019  Total Protein 6.0 - 8.5 g/dL 6.9 7.3 7.4  Albumin 3.8 - 4.9 g/dL 4.4 - 4.0  AST 0 - 40 IU/L '18 20 20  ' ALT 0 - 32 IU/L '22 16 17  ' Alk Phosphatase 44 - 121 IU/L 119 - 86  Total  Bilirubin 0.0 - 1.2 mg/dL 0.3 0.4 0.6  Bilirubin, Direct 0.0 - 0.2 mg/dL - 0.1 -    No recent CBC on file.  Assessment:  #1.  Chronic GERD.  On some days she is having to take 2 doses of PPI.  Dietary compliance good improved.  She does not have underlying Barrett's esophagus.  It would be worthwhile for her to try famotidine at bedtime in place of second dose of PPI as in the long run this combination be safer given that she has osteopenia.   #2.  Patient is average risk for CRC.  Last colonoscopy was in August 2013. Will schedule screening colonoscopy after visit next year.   Plan:  Antireflux measures reinforced. Patient will try taking Pepcid OTC 20 mg at bedtime instead of taking second dose of PPI for few weeks and see how she does.  If this combination does not work she can go back to taking pantoprazole 40 mg twice daily. Will check CBC to make sure her H&H is normal as chronic PPI therapy can lead to iron malabsorption. Patient advised to bring [place] a copy of her next bone density study for review. Office visit in 1 year.

## 2021-07-11 LAB — CBC
HCT: 49 % — ABNORMAL HIGH (ref 35.0–45.0)
Hemoglobin: 15.6 g/dL — ABNORMAL HIGH (ref 11.7–15.5)
MCH: 27.9 pg (ref 27.0–33.0)
MCHC: 31.8 g/dL — ABNORMAL LOW (ref 32.0–36.0)
MCV: 87.5 fL (ref 80.0–100.0)
MPV: 11.9 fL (ref 7.5–12.5)
Platelets: 288 10*3/uL (ref 140–400)
RBC: 5.6 10*6/uL — ABNORMAL HIGH (ref 3.80–5.10)
RDW: 13.7 % (ref 11.0–15.0)
WBC: 6.1 10*3/uL (ref 3.8–10.8)

## 2021-07-23 ENCOUNTER — Ambulatory Visit (INDEPENDENT_AMBULATORY_CARE_PROVIDER_SITE_OTHER): Payer: 59 | Admitting: Gastroenterology

## 2021-07-30 ENCOUNTER — Ambulatory Visit (INDEPENDENT_AMBULATORY_CARE_PROVIDER_SITE_OTHER): Payer: 59 | Admitting: Gastroenterology

## 2021-09-07 ENCOUNTER — Other Ambulatory Visit: Payer: Self-pay

## 2021-09-07 ENCOUNTER — Ambulatory Visit
Admission: EM | Admit: 2021-09-07 | Discharge: 2021-09-07 | Disposition: A | Discharge status: Home / Self Care | Payer: 59 | Attending: Family Medicine | Admitting: Family Medicine

## 2021-09-07 DIAGNOSIS — Z9189 Other specified personal risk factors, not elsewhere classified: Secondary | ICD-10-CM | POA: Diagnosis not present

## 2021-09-07 DIAGNOSIS — J328 Other chronic sinusitis: Secondary | ICD-10-CM | POA: Diagnosis not present

## 2021-09-07 DIAGNOSIS — J3089 Other allergic rhinitis: Secondary | ICD-10-CM

## 2021-09-07 DIAGNOSIS — R0982 Postnasal drip: Secondary | ICD-10-CM

## 2021-09-07 MED ORDER — DEXAMETHASONE SODIUM PHOSPHATE 10 MG/ML IJ SOLN
10.0000 mg | Freq: Once | INTRAMUSCULAR | Status: AC
  Administered 2021-09-07: 10 mg via INTRAMUSCULAR

## 2021-09-07 NOTE — Discharge Instructions (Signed)
Take Mucinex twice daily as needed, consistent allergy regimen with antihistamine, twice daily nasal spray.  Follow-up with your primary care provider if not fully resolving.

## 2021-09-07 NOTE — ED Triage Notes (Signed)
Pt states she has a cough with green mucus and feeling drainage in throat. States it started about 10 days ago. Has tried all over the counter medications.   Denies fever.

## 2021-09-07 NOTE — ED Provider Notes (Signed)
[facility]    CSN: [id number] Arrival date & time: 09/07/21  8099      History   Chief Complaint No chief complaint on file.   HPI Stephanie Bishop is a 55 y.o. female.   Patient presenting today with nearly 2-week history of productive cough, thick drainage in her throat, hoarseness, postnasal drip.  Denies fever, chills, body aches, chest pain, shortness of breath, abdominal pain, nausea vomiting or diarrhea.  So far trying antihistamines, DayQuil, NyQuil, supportive home care with only mild relief.  Known history of seasonal allergies, otherwise no pertinent chronic medical problems.   Past Medical History:  Diagnosis Date   GERD (gastroesophageal reflux disease)    PONV (postoperative nausea and vomiting)     Patient Active Problem List   Diagnosis Date Noted   Primary hypertension 03/07/2021   Gastroesophageal reflux disease without esophagitis 05/03/2019   Intractable vomiting 05/03/2019   Cyst of breast 01/07/2019   Lipoma of axilla 01/07/2019   Prolapse of female genital organs 01/07/2019   Arthritis of first MTP joint 07/23/2018   Metatarsalgia of right foot 07/23/2018   Cervical myopathy 07/31/2016   Abdominal hernia 04/18/2014   Guaiac positive stools 06/15/2012   Abdominal wall mass 06/15/2012   GERD 10/16/2010   EPIGASTRIC PAIN 10/16/2010    Past Surgical History:  Procedure Laterality Date   BIOPSY  05/12/2019   Procedure: BIOPSY;  Surgeon: Rogene Houston, MD;  [facility];  Service: Endoscopy;;  duodenum, antrum   BREAST BIOPSY Left 2012   benign   BREAST CYST ASPIRATION Bilateral    CERVICAL SPINE SURGERY     2001   COLONOSCOPY  07/16/2012   Procedure: COLONOSCOPY;  Surgeon: Rogene Houston, MD;  [facility];  Service: Endoscopy;  Laterality: N/A;  930   ESOPHAGOGASTRODUODENOSCOPY N/A 05/12/2019   Procedure: ESOPHAGOGASTRODUODENOSCOPY (EGD);  Surgeon: Rogene Houston, MD;  [facility];  Service:  Endoscopy;  Laterality: N/A;  245    OB History   No obstetric history on file.      Home Medications    Prior to Admission medications   Medication Sig Start Date End Date Taking? Authorizing Provider  amLODipine (NORVASC) 2.5 MG tablet Take 1 tablet (2.5 mg total) by mouth daily. 03/07/21   Kathyrn Drown, MD  cetirizine (ZYRTEC) 10 MG tablet Take 10 mg by mouth daily as needed for allergies.     [provider]  fluticasone (FLONASE) 50 MCG/ACT nasal spray USE TWO SPRAY(S) IN EACH NOSTRIL ONCE DAILY Patient taking differently: Place 1-2 sprays into both nostrils daily as needed for allergies. 08/28/17   Nilda Simmer, NP  pantoprazole (PROTONIX) 40 MG tablet TAKE 1 TABLET BY MOUTH TWICE DAILY BEFORE A MEAL - MAKE AN APPT PRIOR TO REFILL RUNNING OUT 12/07/20   Rogene Houston, MD    Family History Family History  Problem Relation Age of Onset   Colon cancer Neg Hx     Social History Social History   Tobacco Use   Smoking status: Never   Smokeless tobacco: Never  Vaping Use   Vaping Use: Never used  Substance Use Topics   Alcohol use: No   Drug use: No     Allergies   Prednisone and Ciprofloxacin   Review of Systems Review of Systems Per HPI  Physical Exam Triage Vital Signs ED Triage Vitals  Enc Vitals Group     BP 09/07/21 0918 (!) 158/93  Pulse Rate 09/07/21 0918 88     Resp 09/07/21 0918 18     Temp 09/07/21 0918 98.1 F (36.7 C)     Temp Source 09/07/21 0918 Oral     SpO2 09/07/21 0918 97 %     Weight --      Height --      Head Circumference --      Peak Flow --      Pain Score 09/07/21 0919 2     Pain Loc --      Pain Edu? --      Excl. in [place]? --    No data found.  Updated Vital Signs BP (!) 158/93 (BP Location: Right Arm)   Pulse 88   Temp 98.1 F (36.7 C) (Oral)   Resp 18   LMP 10/11/2018   SpO2 97%   Visual Acuity Right Eye Distance:   Left Eye Distance:   Bilateral Distance:    Right Eye Near:   Left Eye  Near:    Bilateral Near:     Physical Exam Vitals and nursing note reviewed.  Constitutional:      Appearance: Normal appearance. She is not ill-appearing.  HENT:     Head: Atraumatic.     Ears:     Comments: Minimal bilateral middle ear effusions    Nose: Rhinorrhea present.     Mouth/Throat:     Mouth: Mucous membranes are moist.     Pharynx: Posterior oropharyngeal erythema present. No oropharyngeal exudate.  Eyes:     Extraocular Movements: Extraocular movements intact.     Conjunctiva/sclera: Conjunctivae normal.  Cardiovascular:     Rate and Rhythm: Normal rate and regular rhythm.     Heart sounds: Normal heart sounds.  Pulmonary:     Effort: Pulmonary effort is normal. No respiratory distress.     Breath sounds: Normal breath sounds. No wheezing or rales.  Musculoskeletal:        General: Normal range of motion.     Cervical back: Normal range of motion and neck supple.  Skin:    General: Skin is warm and dry.  Neurological:     Mental Status: She is alert and oriented to person, place, and time.     Motor: No weakness.     Gait: Gait normal.  Psychiatric:        Mood and Affect: Mood normal.        Thought Content: Thought content normal.        Judgment: Judgment normal.     UC Treatments / Results  Labs (all labs ordered are listed, but only abnormal results are displayed) Labs Reviewed  COVID-19, FLU A+B NAA    EKG   Radiology No results found.  Procedures Procedures (including critical care time)  Medications Ordered in UC Medications  dexamethasone (DECADRON) injection 10 mg (10 mg Intramuscular Given 09/07/21 0956)    Initial Impression / Assessment and Plan / UC Course  I have reviewed the triage vital signs and the nursing notes.  Pertinent labs & imaging results that were available during my care of the patient were reviewed by me and considered in my medical decision making (see chart for details).     Vital signs reassuring,  suspect inflammatory changes from seasonal allergies/post viral symptoms.  Very low suspicion for an acute viral illness given chronicity of issue and symptoms but COVID, flu testing pending for rule out.  IM Decadron given in clinic, discussed over-the-counter remedies such as  Mucinex, Flonase, Zyrtec for ongoing management.  Return for acutely worsening symptoms.  Final Clinical Impressions(s) / UC Diagnoses   Final diagnoses:  At increased risk of exposure to COVID-19 virus  Seasonal allergic rhinitis due to other allergic trigger  Post-nasal drip  Other chronic sinusitis     Discharge Instructions      Take Mucinex twice daily as needed, consistent allergy regimen with antihistamine, twice daily nasal spray.  Follow-up with your primary care provider if not fully resolving.     ED Prescriptions   None    PDMP not reviewed this encounter.   Volney American, [place] 09/07/21 1035

## 2021-09-08 LAB — COVID-19, FLU A+B NAA
Influenza A, NAA: NOT DETECTED
Influenza B, NAA: NOT DETECTED
SARS-CoV-2, NAA: NOT DETECTED

## 2021-09-18 ENCOUNTER — Other Ambulatory Visit: Payer: Self-pay | Admitting: Obstetrics and Gynecology

## 2021-09-18 DIAGNOSIS — Z1231 Encounter for screening mammogram for malignant neoplasm of breast: Secondary | ICD-10-CM

## 2021-11-15 ENCOUNTER — Ambulatory Visit
Admission: RE | Admit: 2021-11-15 | Discharge: 2021-11-15 | Disposition: A | Discharge status: Home / Self Care | Payer: 59 | Source: Ambulatory Visit | Attending: Obstetrics and Gynecology | Admitting: Obstetrics and Gynecology

## 2021-11-15 DIAGNOSIS — Z1231 Encounter for screening mammogram for malignant neoplasm of breast: Secondary | ICD-10-CM

## 2021-11-16 ENCOUNTER — Encounter (INDEPENDENT_AMBULATORY_CARE_PROVIDER_SITE_OTHER): Payer: Self-pay | Admitting: Internal Medicine

## 2021-11-20 ENCOUNTER — Telehealth (INDEPENDENT_AMBULATORY_CARE_PROVIDER_SITE_OTHER): Payer: Self-pay | Admitting: Internal Medicine

## 2021-11-20 NOTE — Telephone Encounter (Signed)
See my chart message from today 11/20/21

## 2021-11-20 NOTE — Telephone Encounter (Signed)
Pt called back and she takes protonix one qam and rarely takes 2nd dose at night. I let pt know I was waiting for bone density reports to come over. She said she signed a release for dr Laural Golden to get reports.

## 2021-11-20 NOTE — Telephone Encounter (Signed)
Pt was returning nurse Abigail Butts) call. Please call [phone]

## 2021-11-20 NOTE — Telephone Encounter (Signed)
Called and left message with [facility] to get copy of bone density report from 2017 and 2023.   Left message to return call with pt to go over how she is taking reflux meds.

## 2021-11-21 NOTE — Telephone Encounter (Signed)
I received reports and will give to dr Laural Golden to review

## 2021-11-23 ENCOUNTER — Telehealth (INDEPENDENT_AMBULATORY_CARE_PROVIDER_SITE_OTHER): Payer: Self-pay | Admitting: *Deleted

## 2021-11-23 NOTE — Telephone Encounter (Signed)
Copied pt's mychart message below:  Good Morning Dr. Laural Golden, I had asked [facility] to send you my bone density from 2017 and then the one from last week 2023. According to the report I have slight bone loss in these 5 years.  So not sure if the acid reflux meds have anything to do with that.  I am going to be more diligent in taking my Vit. D3 pills and maybe more calcium.  He wants me to be retested in 2 years instead of 5.  If you want me to do anything different with my acid reflux meds, please let me know.  I truly only take it once a day unless I have any bad pain or have "gone off the wagon" with caffeine, chocolate and such.  Thank you so much!  Stephanie Bishop   I discussed with dr Laural Golden and called and discussed wit pt. Per dr Laural Golden - take protonix once daily and if needing second dose try gavison or pepcid if needed. And if doing well on protonix daily and not needing second dose can try going to one every other day. Pt verbalized understanding.

## 2021-11-24 ENCOUNTER — Other Ambulatory Visit (INDEPENDENT_AMBULATORY_CARE_PROVIDER_SITE_OTHER): Payer: Self-pay | Admitting: Internal Medicine

## 2021-11-24 DIAGNOSIS — K219 Gastro-esophageal reflux disease without esophagitis: Secondary | ICD-10-CM

## 2021-12-01 ENCOUNTER — Ambulatory Visit
Admission: EM | Admit: 2021-12-01 | Discharge: 2021-12-01 | Disposition: A | Discharge status: Home / Self Care | Payer: 59 | Attending: Family Medicine | Admitting: Family Medicine

## 2021-12-01 ENCOUNTER — Other Ambulatory Visit: Payer: Self-pay

## 2021-12-01 ENCOUNTER — Encounter: Payer: Self-pay | Admitting: *Deleted

## 2021-12-01 ENCOUNTER — Ambulatory Visit (INDEPENDENT_AMBULATORY_CARE_PROVIDER_SITE_OTHER): Payer: 59

## 2021-12-01 DIAGNOSIS — R42 Dizziness and giddiness: Secondary | ICD-10-CM

## 2021-12-01 DIAGNOSIS — R079 Chest pain, unspecified: Secondary | ICD-10-CM

## 2021-12-01 HISTORY — DX: Essential (primary) hypertension: I10

## 2021-12-01 MED ORDER — ONDANSETRON 4 MG PO TBDP: 4.0000 mg | ORAL_TABLET | Freq: Three times a day (TID) | ORAL | 0 refills | Status: DC | PRN

## 2021-12-01 MED ORDER — PREDNISONE 20 MG PO TABS: 40.0000 mg | ORAL_TABLET | Freq: Every day | ORAL | 0 refills | Status: DC

## 2021-12-01 NOTE — Discharge Instructions (Addendum)
You have had labs (blood work) drawn today. We will call you with any significant abnormalities or if there is need to begin or change treatment or pursue further follow up.  You may also review your test results online through MyChart. If you do not have a MyChart account, instructions to sign up should be on your discharge paperwork.  

## 2021-12-01 NOTE — ED Triage Notes (Signed)
C/O intermittent vertigo over past 2 wks with worsening last night, with nausea over past 2 days.  Pt states her neck muscles get inflamed when lifting a lot, with hx of cervical hardware in place, and she feels this may trigger vertigo episodes.  C/O left upper chest pain over past 2-3 days, "dull ache"; pain worse with palpation, but not with movement.

## 2021-12-03 ENCOUNTER — Encounter: Payer: Self-pay | Admitting: Family Medicine

## 2021-12-03 DIAGNOSIS — R42 Dizziness and giddiness: Secondary | ICD-10-CM

## 2021-12-03 NOTE — Telephone Encounter (Signed)
Nurses May have referral to ENT.  If she has not seen any ENT-[facility] in [place] is a good group that could help manage this.  If she already has an established ENT please go ahead with a urgent referral because of vertigo  As for doing a scan we would need to see her.  This would allow [place] to help determine if or if not a scan would be recommended.  So it would be up to her if she decides she wants to be seen to have that heart evaluated.

## 2021-12-03 NOTE — ED Provider Notes (Signed)
[facility]   [id number] 12/01/21 Arrival Time: 0277  ASSESSMENT & PLAN:  1. Vertigo   2. Chest pain, unspecified type    Normal neurologic exam. Question variant of BPPV; discussed. Long-standing problem. No suspicion for ICH or SAH. No indication for neurodiagnostic imaging at this time.  EKG Interpretation Interpreted by me: Date/Time:  Saturday December 01 2021 09:04:43 EST Text Interpretation: Normal sinus rhythm Normal ECG. No STEMI.  I have personally viewed the imaging studies ordered this visit. No acute changes on CXR. No pneumothorax.  Recommend:  Follow-up Information     Schedule an appointment as soon as possible for a visit  with [facility].   Contact information: [address] [place] [address] [phone]                Begin: Meds ordered this encounter  Medications   ondansetron (ZOFRAN-ODT) 4 MG disintegrating tablet    Sig: Take 1 tablet (4 mg total) by mouth every 8 (eight) hours as needed for nausea or vomiting.    Dispense:  15 tablet    Refill:  0   predniSONE (DELTASONE) 20 MG tablet    Sig: Take 2 tablets (40 mg total) by mouth daily.    Dispense:  10 tablet    Refill:  0    Reassured that these symptoms do not appear to represent a serious or threatening condition. This is generally a self-limited temporary but uncomfortable situation. Rest, avoid potentially dangerous activities (such as driving or working with machinery or at heights). Use OTC Meclizine prn. Will proceed to the ED if she develops other symptoms such as alterations of speech, swallowing, vision, motor/sensory systems, or if dizziness worsens.  Pending upon discharge: Labs Reviewed  CBC  BASIC METABOLIC PANEL  TSH    Reviewed expectations re: course of current medical issues. Questions answered. Outlined signs and symptoms indicating need for more acute intervention. Patient verbalized  understanding. After Visit Summary given.   SUBJECTIVE:  Stephanie Bishop is a 56 y.o. female who reports on/off episodes of vertigo over past few months, maybe longer. Past two weeks with short episodes; more when supine. Reports associated L neck pain with vertigo; h/o neck surgery with hardware present. No visual changes. No extremity sensation changes or weakness.  Past few days also reports a dull chest pain; upper chest; "sore when I touch it". No worsening with ext or neck movements. Not experiencing currently. No tx PTA.  Social History   Substance and Sexual Activity  Alcohol Use No   Social History   Tobacco Use  Smoking Status Never  Smokeless Tobacco Never   Denies recreational drug use.  OBJECTIVE:  Vitals:   12/01/21 0853  BP: (!) 155/97  Pulse: 88  Resp: 20  Temp: 98 F (36.7 C)  TempSrc: Oral  SpO2: 97%    General appearance: alert; no distress Eyes: PERRLA; EOMI; conjunctiva normal HENT: normocephalic; atraumatic; TMs normal; nasal mucosa normal; oral mucosa normal Neck: supple with FROM Lungs: clear to auscultation bilaterally Heart: regular rate and rhythm Abdomen: soft, non-tender; bowel sounds normal Extremities: no cyanosis or edema; symmetrical with no gross deformities Skin: warm and dry Neurologic: normal gait; DTR's normal and symmetric; CN 2-12 grossly intact; rapid changes in position during the exam do precipitate brief vertigo Psychological: alert and cooperative; normal mood and affect   DG Chest 2 View  Result Date: 12/01/2021 CLINICAL DATA:  Left upper chest pain. EXAM:  CHEST - 2 VIEW COMPARISON:  January 24, 2021 FINDINGS: The heart size and mediastinal contours are within normal limits. No focal consolidation. No pleural effusion. No pneumothorax. Cervical fusion hardware. IMPRESSION: No acute cardiopulmonary process. Electronically Signed   By: Dahlia Bailiff M.D.   On: 12/01/2021 09:13    Allergies  Allergen Reactions    Prednisone     Gives her migraines   Ciprofloxacin Other (See Comments)    Knots on Face     Past Medical History:  Diagnosis Date   GERD (gastroesophageal reflux disease)    Hypertension    PONV (postoperative nausea and vomiting)    Social History   Socioeconomic History   Marital status: Married    Spouse name: Not on file   Number of children: Not on file   Years of education: Not on file   Highest education level: Not on file  Occupational History   Not on file  Tobacco Use   Smoking status: Never   Smokeless tobacco: Never  Vaping Use   Vaping Use: Never used  Substance and Sexual Activity   Alcohol use: No   Drug use: No   Sexual activity: Not on file  Other Topics Concern   Not on file  Social History Narrative   Not on file   Social Determinants of Health   Financial Resource Strain: Not on file  Food Insecurity: Not on file  Transportation Needs: Not on file  Physical Activity: Not on file  Stress: Not on file  Social Connections: Not on file  Intimate Partner Violence: Not on file   Family History  Problem Relation Age of Onset   Colon cancer Neg Hx    Past Surgical History:  Procedure Laterality Date   BIOPSY  05/12/2019   Procedure: BIOPSY;  Surgeon: Rogene Houston, MD;  [facility];  Service: Endoscopy;;  duodenum, antrum   BREAST BIOPSY Left 2012   benign   BREAST CYST ASPIRATION Bilateral    CERVICAL SPINE SURGERY     2001   COLONOSCOPY  07/16/2012   Procedure: COLONOSCOPY;  Surgeon: Rogene Houston, MD;  [facility];  Service: Endoscopy;  Laterality: N/A;  930   ESOPHAGOGASTRODUODENOSCOPY N/A 05/12/2019   Procedure: ESOPHAGOGASTRODUODENOSCOPY (EGD);  Surgeon: Rogene Houston, MD;  [facility];  Service: Endoscopy;  Laterality: N/A;  [place]     06/2020       Vanessa Kick, MD 12/03/21 [phone]

## 2021-12-04 ENCOUNTER — Telehealth: Payer: Self-pay | Admitting: Family Medicine

## 2021-12-04 LAB — BASIC METABOLIC PANEL
BUN/Creatinine Ratio: 15 (ref 9–23)
BUN: 13 mg/dL (ref 6–24)
CO2: 23 mmol/L (ref 20–29)
Calcium: 9.7 mg/dL (ref 8.7–10.2)
Chloride: 106 mmol/L (ref 96–106)
Creatinine, Ser: 0.89 mg/dL (ref 0.57–1.00)
Glucose: 93 mg/dL (ref 70–99)
Potassium: 4.2 mmol/L (ref 3.5–5.2)
Sodium: 143 mmol/L (ref 134–144)
eGFR: 77 mL/min/{1.73_m2} (ref >59)

## 2021-12-04 LAB — TSH: TSH: 2.86 u[IU]/mL (ref 0.450–4.500)

## 2021-12-04 LAB — CBC
Hematocrit: 49.6 % — ABNORMAL HIGH (ref 34.0–46.6)
Hemoglobin: 15.9 g/dL (ref 11.1–15.9)
MCH: 27 pg (ref 26.6–33.0)
MCHC: 32.1 g/dL (ref 31.5–35.7)
MCV: 84 fL (ref 79–97)
Platelets: 297 10*3/uL (ref 150–450)
RBC: 5.88 x10E6/uL — ABNORMAL HIGH (ref 3.77–5.28)
RDW: 14.3 % (ref 11.7–15.4)
WBC: 5.6 10*3/uL (ref 3.4–10.8)

## 2021-12-04 NOTE — Telephone Encounter (Signed)
Pt wants to know which ENT she will be seeing so that she can see if they are covered under her ins.   [phone]

## 2021-12-04 NOTE — Telephone Encounter (Signed)
Patient asks about her abnormal rbc results done by urgent care from 12/01/21, she saw these on mychart and unable to speak with anyone at [facility] office, if this is something she should be concerned about. Please advise

## 2021-12-04 NOTE — Telephone Encounter (Signed)
Nurses Have Courtney-referral coordinator-help set Lattie Haw up with ENT.  Need to make sure that they are on her plan.  [facility] or [facility] would be good-both would do a good job.  I would recommend letting the ENT determine if she needs MRI at this point.  It may or may not be needed and would be more prudent to have the specialist determine after they see her. Thanks-Dr. Nicki Reaper

## 2021-12-04 NOTE — Telephone Encounter (Signed)
Nurses-I reviewed over her CBCs.  RBC slightly elevated.  But her overall hemoglobin hematocrit are not in a worrisome range.  Typically if the hemoglobin is starting to get at the 16.5 range or more this indicates polycythemia that would need further work-up.  This is something that can easily be rechecked again later spring/summer with a CBC.  If she would like for this to be done we can set her up for blood work and a follow-up visit late spring/early summer

## 2021-12-05 NOTE — Telephone Encounter (Signed)
Patient advised per Dr Nicki Reaper: Dr Nicki Reaper reviewed over your CBCs.  RBC slightly elevated.  But her overall hemoglobin hematocrit are not in a worrisome range.  Typically if the hemoglobin is starting to get at the 16.5 range or more this indicates polycythemia that would need further work-up.  This is something that can easily be rechecked again later spring/summer with a CBC.  If you would like for this to be done we can set you up for blood work and a follow-up visit late spring/early summer. Patient verbalized understanding.

## 2021-12-14 ENCOUNTER — Encounter: Payer: Self-pay | Admitting: Family Medicine

## 2021-12-14 ENCOUNTER — Ambulatory Visit (INDEPENDENT_AMBULATORY_CARE_PROVIDER_SITE_OTHER): Payer: 59 | Admitting: Family Medicine

## 2021-12-14 ENCOUNTER — Other Ambulatory Visit: Payer: Self-pay

## 2021-12-14 VITALS — BP 114/80 | HR 107 | Temp 98.1°F | Ht 63.0 in | Wt 170.0 lb

## 2021-12-14 DIAGNOSIS — M5416 Radiculopathy, lumbar region: Secondary | ICD-10-CM

## 2021-12-14 DIAGNOSIS — R42 Dizziness and giddiness: Secondary | ICD-10-CM

## 2021-12-14 DIAGNOSIS — M5090 Cervical disc disorder, unspecified, unspecified cervical region: Secondary | ICD-10-CM

## 2021-12-14 DIAGNOSIS — R519 Headache, unspecified: Secondary | ICD-10-CM | POA: Diagnosis not present

## 2021-12-14 NOTE — Progress Notes (Signed)
° °  Subjective:    Patient ID: Stephanie Bishop, female    DOB: 05-15-1966, 56 y.o.   MRN: [id number]  HPI Dizziness , bouts of nausea and vomiting , sweats  Neck stiffness - has plates and screws in neck  Very difficult situation Having posterior neck pain and posterior pain in the occiput region.  Hurts with movement up down left right restricted range of motion Patient also with significant vertigo symptoms when she moves her head twist turns.  Gets nauseated.  Feels like the room is turning.  Unsteady.  Has not had any falls.  This been going on for over 6 weeks.  Progressive  Review of Systems     Objective:   Physical Exam Finger-to-nose normal horizontal nystagmus and even some rotary changes with vertical gaze Lungs clear heart regular       Assessment & Plan:  1. Cervical neck pain with evidence of disc disease Recommend imaging because of the amount of pain she is having that goes up into the occiput  2. Vertebral syndrome It is hard to know if she may be having compromise of the vertebral arteries spoke with radiology they recommend CTA we will progress forward with setting this up  If this test comes back normal neck step would be MRI of the brain and cervical spine  She has consultation with ENT coming up later in March  3. Vertigo See above Consultation with ENT coming up Slow movements recommended

## 2021-12-15 ENCOUNTER — Telehealth: Payer: Self-pay | Admitting: Family Medicine

## 2021-12-15 DIAGNOSIS — M5416 Radiculopathy, lumbar region: Secondary | ICD-10-CM

## 2021-12-15 DIAGNOSIS — R42 Dizziness and giddiness: Secondary | ICD-10-CM

## 2021-12-15 DIAGNOSIS — M5090 Cervical disc disorder, unspecified, unspecified cervical region: Secondary | ICD-10-CM

## 2021-12-15 NOTE — Telephone Encounter (Signed)
This patient has had a complex situation She is to have CTA of the neck to look at the vertebral arteries because of vertebral syndrome as well as vertigo.  Ideally I would like for this test to be urgent.  As you are aware there have been scheduling snafus with the patient would like to try to avoid this.  Please communicate with Stephanie Bishop as soon as you find out when it is scheduled please call the patient with her test time thank you

## 2021-12-19 ENCOUNTER — Other Ambulatory Visit: Payer: Self-pay

## 2021-12-19 ENCOUNTER — Ambulatory Visit (HOSPITAL_COMMUNITY)
Admission: RE | Admit: 2021-12-19 | Discharge: 2021-12-19 | Disposition: A | Discharge status: Home / Self Care | Payer: 59 | Source: Ambulatory Visit | Attending: Family Medicine | Admitting: Family Medicine

## 2021-12-19 DIAGNOSIS — M5416 Radiculopathy, lumbar region: Secondary | ICD-10-CM | POA: Insufficient documentation

## 2021-12-19 DIAGNOSIS — R42 Dizziness and giddiness: Secondary | ICD-10-CM | POA: Insufficient documentation

## 2021-12-19 DIAGNOSIS — M5090 Cervical disc disorder, unspecified, unspecified cervical region: Secondary | ICD-10-CM | POA: Diagnosis present

## 2021-12-19 MED ORDER — IOHEXOL 350 MG/ML SOLN
75.0000 mL | Freq: Once | INTRAVENOUS | Status: AC | PRN
  Administered 2021-12-19: 75 mL via INTRAVENOUS

## 2021-12-19 NOTE — Telephone Encounter (Addendum)
CT scan is still under insurance review for PA per referral coordinator

## 2021-12-19 NOTE — Telephone Encounter (Signed)
Patient scheduled 12/19/21 at 3 pm at [facility]. Patient notified.

## 2021-12-24 ENCOUNTER — Other Ambulatory Visit: Payer: Self-pay

## 2021-12-24 ENCOUNTER — Ambulatory Visit (INDEPENDENT_AMBULATORY_CARE_PROVIDER_SITE_OTHER): Payer: 59 | Admitting: Family Medicine

## 2021-12-24 ENCOUNTER — Encounter: Payer: Self-pay | Admitting: Family Medicine

## 2021-12-24 VITALS — BP 132/70 | Temp 97.2°F | Wt 175.4 lb

## 2021-12-24 DIAGNOSIS — M542 Cervicalgia: Secondary | ICD-10-CM | POA: Diagnosis not present

## 2021-12-24 DIAGNOSIS — R519 Headache, unspecified: Secondary | ICD-10-CM | POA: Diagnosis not present

## 2021-12-24 DIAGNOSIS — R42 Dizziness and giddiness: Secondary | ICD-10-CM

## 2021-12-24 DIAGNOSIS — M5090 Cervical disc disorder, unspecified, unspecified cervical region: Secondary | ICD-10-CM

## 2021-12-24 NOTE — Progress Notes (Signed)
° °  Subjective:    Patient ID: Stephanie Bishop, female    DOB: Nov 30, 1965, 56 y.o.   MRN: [id number]  HPI Pt following up on dizziness. Pt states some days are better than others. Yesterday was a bad day. Has not driven in over 2 weeks. Pt states overall dizziness is still the same. Pt had CT done on 12/19/21  Patient having persistent headaches in the back part of her head worse with certain activities.  At times feels it at nighttime as well.  In addition to this has neck pain and discomfort that radiates into the left side of the neck up the back of the left side of the head and radiates down the arm been present for weeks  Also has vertigo symptoms of the room spinning things causing her to feel nauseous double vision etc. when she turns her head certain ways Review of Systems     Objective:   Physical Exam  General-in no acute distress Eyes-no discharge Lungs-respiratory rate normal, CTA CV-no murmurs,RRR Extremities skin warm dry no edema Neuro grossly normal Behavior normal, alert Subjective discomfort back part of her neck and back part of her head  Previous neck surgery with fusion Subjective discomfort down the left arm no weakness on exam     Assessment & Plan:  Vertigo symptoms recommend gentle range of motion to avoid triggers  Persistent headaches with severe vertigo despite conservative measures for 6 weeks recommend MRI of the brain  Cervical spine impingement with nerve impingement recommend MRI previous surgery in her neck for fusion.  Also hopefully ENT can get her in sooner

## 2021-12-29 ENCOUNTER — Ambulatory Visit (HOSPITAL_COMMUNITY)
Admission: RE | Admit: 2021-12-29 | Discharge: 2021-12-29 | Disposition: A | Discharge status: Home / Self Care | Payer: 59 | Source: Ambulatory Visit | Attending: Family Medicine | Admitting: Family Medicine

## 2021-12-29 DIAGNOSIS — R519 Headache, unspecified: Secondary | ICD-10-CM | POA: Insufficient documentation

## 2021-12-29 DIAGNOSIS — M542 Cervicalgia: Secondary | ICD-10-CM | POA: Diagnosis present

## 2021-12-29 DIAGNOSIS — R42 Dizziness and giddiness: Secondary | ICD-10-CM | POA: Diagnosis present

## 2021-12-31 ENCOUNTER — Other Ambulatory Visit: Payer: Self-pay | Admitting: Family Medicine

## 2022-01-02 ENCOUNTER — Other Ambulatory Visit: Payer: Self-pay

## 2022-01-02 ENCOUNTER — Ambulatory Visit (INDEPENDENT_AMBULATORY_CARE_PROVIDER_SITE_OTHER): Payer: 59 | Admitting: Family Medicine

## 2022-01-02 ENCOUNTER — Telehealth: Payer: Self-pay | Admitting: *Deleted

## 2022-01-02 ENCOUNTER — Encounter: Payer: Self-pay | Admitting: Family Medicine

## 2022-01-02 DIAGNOSIS — M5090 Cervical disc disorder, unspecified, unspecified cervical region: Secondary | ICD-10-CM

## 2022-01-02 DIAGNOSIS — R110 Nausea: Secondary | ICD-10-CM

## 2022-01-02 DIAGNOSIS — R42 Dizziness and giddiness: Secondary | ICD-10-CM

## 2022-01-02 NOTE — Telephone Encounter (Signed)
Ms. harlan, vinal are scheduled for a virtual visit with your provider today.   ? ?Just as we do with appointments in the office, we must obtain your consent to participate.  Your consent will be active for this visit and any virtual visit you may have with one of our providers in the next 365 days.   ? ?If you have a MyChart account, I can also send a copy of this consent to you electronically.  All virtual visits are billed to your insurance company just like a traditional visit in the office.  As this is a virtual visit, video technology does not allow for your provider to perform a traditional examination.  This may limit your provider's ability to fully assess your condition.  If your provider identifies any concerns that need to be evaluated in person or the need to arrange testing such as labs, EKG, etc, we will make arrangements to do so.   ? ?Although advances in technology are sophisticated, we cannot ensure that it will always work on either your end or our end.  If the connection with a video visit is poor, we may have to switch to a telephone visit.  With either a video or telephone visit, we are not always able to ensure that we have a secure connection.   I need to obtain your verbal consent now.   Are you willing to proceed with your visit today?  ? ?Ellana Kawa Smart has provided verbal consent on 01/02/2022 for a virtual visit (video or telephone). ? ? ?  ?

## 2022-01-02 NOTE — Progress Notes (Signed)
? ?  Subjective:  ? ? Patient ID: Stephanie Bishop, female    DOB: October 11, 1966, 56 y.o.   MRN: [id number] ? ?HPI ?Patient calls to discuss results of recent MRI. ? ?Virtual Visit via Telephone Note ? ?I connected with Stephanie Bishop on 01/02/22 at  9:00 AM EST by telephone and verified that I am speaking with the correct person using two identifiers. ? ?Location: ?Patient: home ?Provider: office ?  ?I discussed the limitations, risks, security and privacy concerns of performing an evaluation and management service by telephone and the availability of in person appointments. I also discussed with the patient that there may be a patient responsible charge related to this service. The patient expressed understanding and agreed to proceed. ? ? ?History of Present Illness: ? ?  ?Observations/Objective: ? ? ?Assessment and Plan: ? ? ?Follow Up Instructions: ? ?  ?I discussed the assessment and treatment plan with the patient. The patient was provided an opportunity to ask questions and all were answered. The patient agreed with the plan and demonstrated an understanding of the instructions. ?  ?The patient was advised to call back or seek an in-person evaluation if the symptoms worsen or if the condition fails to improve as anticipated. ? ?I provided 15 minutes of non-face-to-face time during this encounter. ? ? ? ? ?Review of Systems ? ?   ?Objective:  ? Physical Exam ?Today's visit was via telephone ?Physical exam was not possible for this visit ? ? ? ? ?   ?Assessment & Plan:  ? ?1. Cervical neck pain with evidence of disc disease ?Patient has significant findings of assimilation at C1-C2 has pain discomfort in the back of her neck back of her head that gets worse with movement and also has dizziness associated with all of this.  I do not feel that her dizziness is in her ear I believe this is more related to the cervical spine as it joins to the skull.  Patient would benefit from neurosurgical consultation.  We will try  to get this urgently. ?- Ambulatory referral to Neurosurgery ? ?2. Dizziness after extension of neck ?Patient had CTA no blockage of vertebral arteries she does have craniocervical syndrome causing her pain and discomfort ?- Ambulatory referral to Neurosurgery ? ?3. Nausea ?Worse with certain movements ? ?

## 2022-01-06 ENCOUNTER — Telehealth: Payer: Self-pay | Admitting: Family Medicine

## 2022-01-06 NOTE — Telephone Encounter (Signed)
Nurses ?Patient does need to have neurosurgery consultation.  Unfortunately her insurance plan is subpar and does not cover any neurosurgeons in this area.  Loma Sousa did try to get the patient in with [facility] but they stated that they would not accept her insurance.  Even when Loma Sousa tried to explain [facility] stated that they do not cover this insurance plan at all.  Please see the below message.  I would highly recommend for Birdia to discuss with her insurance representative where they would cover her for neurosurgery.  Perhaps [facility]?  Other places? ?(Unfortunately just having her insurance agent-Mr. Witt-say that neurosurgery is covered does not make it so when we called the neurosurgeons.  I am suspecting we will have to send her through a university [facility].  But she needs to talk with her insurance agent and Friday insurance plan to find out what neurosurgeon might be covered) once she has figure this out it would be fine for her to let us know then we could help with referral.  Alternative option would be to go ahead and see neurology which is covered but if they come to the conclusion that she needs neurosurgery there is not much they can do to help.  But perhaps they may have some insight that would help her situation.  If she would like neurology referral I believe [facility] is covered under her plan ? ?Please see Courtney's message ? ?Marena Chancy, MD ?Good Afternoon Dr. Wolfgang Phoenix - I called [facility] and their Office does not accept [employer] unfortunately and since the Patient has insurance I was informed that she would not qualify for Self pay option either :( This Patient's Insurance is extremely difficult for Neurosurgery Referrals!   ?  ?   ? ?

## 2022-01-07 NOTE — Telephone Encounter (Signed)
Patient notified and stated she will speak with her insurance agent to see if a neurosurgeon at one of the university centers would be covered and call back with the information. ?

## 2022-01-08 ENCOUNTER — Encounter: Payer: Self-pay | Admitting: Family Medicine

## 2022-01-09 ENCOUNTER — Other Ambulatory Visit: Payer: Self-pay

## 2022-01-09 DIAGNOSIS — E237 Disorder of pituitary gland, unspecified: Secondary | ICD-10-CM

## 2022-01-09 NOTE — Progress Notes (Signed)
Na

## 2022-01-10 LAB — PROLACTIN: Prolactin: 15.9 ng/mL (ref 4.8–23.3)

## 2022-01-17 ENCOUNTER — Telehealth: Payer: Self-pay | Admitting: Family Medicine

## 2022-01-17 NOTE — Telephone Encounter (Signed)
Thank you for informing me ? ?Nurses-please handle getting this set up along with documentation that is being requested ?Thank you ?

## 2022-01-17 NOTE — Telephone Encounter (Signed)
Dwayne Whitt from [employer] [phone]) calling in regards to patient. Pt has Friday health plan and has been referred to [facility]. [facility] is not in network but has agreed to accept Friday insurance and they will draw up a single case agreement. Nickola Major is requesting documents be sent to [facility] along with note saying that [employer] has agreed to do a single case agreement. Will fax recent office notes to [facility] with note regarding single case agreement per Nickola Major. [phone] ?[phone]) ?

## 2022-01-17 NOTE — Telephone Encounter (Signed)
Recent office notes and imaging have been faxed to [facility] along with note on cover sheet that [employer] is willing to draw up a single case agreement.  ?

## 2022-01-24 ENCOUNTER — Encounter: Payer: Self-pay | Admitting: Family Medicine

## 2022-01-24 DIAGNOSIS — R519 Headache, unspecified: Secondary | ICD-10-CM

## 2022-01-24 DIAGNOSIS — E237 Disorder of pituitary gland, unspecified: Secondary | ICD-10-CM

## 2022-02-11 NOTE — Telephone Encounter (Signed)
Nurses ?Please order MRI pituitary gland protocol with and without contrast due to possible pituitary microadenoma and severe headaches with nausea and dizziness ?

## 2022-03-07 ENCOUNTER — Ambulatory Visit (HOSPITAL_COMMUNITY)
Admission: RE | Admit: 2022-03-07 | Discharge: 2022-03-07 | Disposition: A | Discharge status: Home / Self Care | Payer: 59 | Source: Ambulatory Visit | Attending: Family Medicine | Admitting: Family Medicine

## 2022-03-07 DIAGNOSIS — E237 Disorder of pituitary gland, unspecified: Secondary | ICD-10-CM | POA: Diagnosis not present

## 2022-03-07 DIAGNOSIS — R519 Headache, unspecified: Secondary | ICD-10-CM | POA: Diagnosis present

## 2022-03-07 MED ORDER — GADOBUTROL 1 MMOL/ML IV SOLN
7.0000 mL | Freq: Once | INTRAVENOUS | Status: AC | PRN
  Administered 2022-03-07: 7 mL via INTRAVENOUS

## 2022-03-13 ENCOUNTER — Ambulatory Visit (INDEPENDENT_AMBULATORY_CARE_PROVIDER_SITE_OTHER): Payer: 59 | Admitting: Family Medicine

## 2022-03-13 ENCOUNTER — Telehealth: Payer: Self-pay | Admitting: *Deleted

## 2022-03-13 DIAGNOSIS — D352 Benign neoplasm of pituitary gland: Secondary | ICD-10-CM

## 2022-03-13 NOTE — Telephone Encounter (Signed)
Ms. savanha, island are scheduled for a virtual visit with your provider today.   ? ?Just as we do with appointments in the office, we must obtain your consent to participate.  Your consent will be active for this visit and any virtual visit you may have with one of our providers in the next 365 days.   ? ?If you have a MyChart account, I can also send a copy of this consent to you electronically.  All virtual visits are billed to your insurance company just like a traditional visit in the office.  As this is a virtual visit, video technology does not allow for your provider to perform a traditional examination.  This may limit your provider's ability to fully assess your condition.  If your provider identifies any concerns that need to be evaluated in person or the need to arrange testing such as labs, EKG, etc, we will make arrangements to do so.   ? ?Although advances in technology are sophisticated, we cannot ensure that it will always work on either your end or our end.  If the connection with a video visit is poor, we may have to switch to a telephone visit.  With either a video or telephone visit, we are not always able to ensure that we have a secure connection.   I need to obtain your verbal consent now.   Are you willing to proceed with your visit today?  ? ?Refugia Laneve Eves has provided verbal consent on 03/13/2022 for a virtual visit (video or telephone). ? ? ?Ned Card, LPN ?3/43/5686  16:83 AM ?  ?

## 2022-03-13 NOTE — Progress Notes (Signed)
Pituitary micro ? ?Subjective:  ? ? Patient ID: Stephanie Bishop, female    DOB: 1965/12/02, 56 y.o.   MRN: [id number] ? ?HPI ? ?Patient wants to discuss MRI results. ?We did discuss MRI in detail including the pituitary microadenoma ?Prolactin was negative ?The size of this was seven-point millimeters it does not push against the optic chiasm there is some microvascular ischemic areas potentially but her previous blood pressure and cholesterol look very good does not smoke ?Virtual Visit via Telephone Note ? ?I connected with Stephanie Bishop on 03/13/22 at 11:20 AM EDT by telephone and verified that I am speaking with the correct person using two identifiers. ? ?Location: ?Patient: home ?Provider: office ?  ?I discussed the limitations, risks, security and privacy concerns of performing an evaluation and management service by telephone and the availability of in person appointments. I also discussed with the patient that there may be a patient responsible charge related to this service. The patient expressed understanding and agreed to proceed. ? ? ?History of Present Illness: ? ?  ?Observations/Objective: ? ? ?Assessment and Plan: ? ? ?Follow Up Instructions: ? ?  ?I discussed the assessment and treatment plan with the patient. The patient was provided an opportunity to ask questions and all were answered. The patient agreed with the plan and demonstrated an understanding of the instructions. ?  ?The patient was advised to call back or seek an in-person evaluation if the symptoms worsen or if the condition fails to improve as anticipated. ? ?I provided 15 minutes of non-face-to-face time during this encounter. ? ? ?  ?Review of Systems ? ?   ?Objective:  ? Physical Exam ?Telephone visit ? ? ? ?   ?Assessment & Plan:  ? ?Pituitary microadenoma ?Prolactin level normal ?Sides and 7.5 mm ?Seeing neurology at [facility] hopefully in the near future ?Patient will keep [place] updated ?She will also get [place] records from her ENT  regarding visual disturbance they were seen ?On the MRI there is no compression of the optic chiasm ?As for the microvascular ischemia patient's blood pressure has been good cholesterol looks excellent ?Patient will keep [place] updated regarding the progression of her work-up at [facility] neurologist in a neurology clinic in [facility] ?

## 2022-04-30 ENCOUNTER — Encounter: Payer: Self-pay | Admitting: Family Medicine

## 2022-04-30 ENCOUNTER — Ambulatory Visit (INDEPENDENT_AMBULATORY_CARE_PROVIDER_SITE_OTHER): Payer: 59 | Admitting: Family Medicine

## 2022-04-30 DIAGNOSIS — H6690 Otitis media, unspecified, unspecified ear: Secondary | ICD-10-CM | POA: Insufficient documentation

## 2022-04-30 DIAGNOSIS — H6692 Otitis media, unspecified, left ear: Secondary | ICD-10-CM | POA: Diagnosis not present

## 2022-04-30 MED ORDER — MUPIROCIN 2 % EX OINT: 1.0000 | TOPICAL_OINTMENT | Freq: Three times a day (TID) | CUTANEOUS | 0 refills | Status: AC

## 2022-04-30 MED ORDER — AMOXICILLIN-POT CLAVULANATE 875-125 MG PO TABS: 1.0000 | ORAL_TABLET | Freq: Two times a day (BID) | ORAL | 0 refills | Status: DC

## 2022-05-20 ENCOUNTER — Ambulatory Visit (INDEPENDENT_AMBULATORY_CARE_PROVIDER_SITE_OTHER): Payer: 59

## 2022-05-20 ENCOUNTER — Ambulatory Visit: Payer: 59 | Admitting: Podiatry

## 2022-05-20 ENCOUNTER — Encounter: Payer: Self-pay | Admitting: Podiatry

## 2022-05-20 DIAGNOSIS — M7662 Achilles tendinitis, left leg: Secondary | ICD-10-CM | POA: Diagnosis not present

## 2022-05-20 DIAGNOSIS — M722 Plantar fascial fibromatosis: Secondary | ICD-10-CM

## 2022-05-20 DIAGNOSIS — D352 Benign neoplasm of pituitary gland: Secondary | ICD-10-CM | POA: Insufficient documentation

## 2022-05-20 MED ORDER — MELOXICAM 15 MG PO TABS: 15.0000 mg | ORAL_TABLET | Freq: Every day | ORAL | 3 refills | Status: DC

## 2022-05-20 MED ORDER — TRIAMCINOLONE ACETONIDE 40 MG/ML IJ SUSP
20.0000 mg | Freq: Once | INTRAMUSCULAR | Status: AC
  Administered 2022-05-20: 20 mg

## 2022-05-20 NOTE — Progress Notes (Signed)
She presents today for follow-up of her Planter fasciitis of her left foot.  States has been aching over the past several months morning is good but later in the day seems to be worsened.  Her right foot is doing well after an endoscopic fasciotomy.  She goes on to say that she has gained approximately 50 pounds and is currently suffering from a pituitary adenoma.  Objective: Vital signs are stable alert and oriented x3.  Pulses are palpable.  She has severe pain on palpation medial calcaneal tubercle of her left heel.  Left foot demonstrates soft tissue increase in density at the plantar fascial Caney insertion site radiographically.  Minimal spurring is noted.  Assessment: Planter fasciitis.  Plan: Injected the left heel today 20 mg Kenalog 5 mg Marcaine and started her on meloxicam.  She has a cam boot at home and a night splint which she will try wearing.  Follow-up with her in 1 month consider MRI if not improved.

## 2022-05-22 ENCOUNTER — Encounter: Payer: Self-pay | Admitting: Family Medicine

## 2022-05-22 ENCOUNTER — Ambulatory Visit (HOSPITAL_COMMUNITY)
Admission: RE | Admit: 2022-05-22 | Discharge: 2022-05-22 | Disposition: A | Discharge status: Home / Self Care | Payer: 59 | Source: Ambulatory Visit | Attending: Family Medicine | Admitting: Family Medicine

## 2022-05-22 DIAGNOSIS — R051 Acute cough: Secondary | ICD-10-CM | POA: Insufficient documentation

## 2022-05-22 NOTE — Telephone Encounter (Signed)
Coral Spikes, DO     Go ahead and order a stat chest xray and call patient please.

## 2022-06-10 ENCOUNTER — Other Ambulatory Visit (INDEPENDENT_AMBULATORY_CARE_PROVIDER_SITE_OTHER): Payer: Self-pay | Admitting: Internal Medicine

## 2022-06-10 DIAGNOSIS — K219 Gastro-esophageal reflux disease without esophagitis: Secondary | ICD-10-CM

## 2022-06-13 ENCOUNTER — Other Ambulatory Visit (INDEPENDENT_AMBULATORY_CARE_PROVIDER_SITE_OTHER): Payer: Self-pay | Admitting: Internal Medicine

## 2022-06-13 DIAGNOSIS — K219 Gastro-esophageal reflux disease without esophagitis: Secondary | ICD-10-CM

## 2022-06-19 ENCOUNTER — Ambulatory Visit: Payer: 59 | Admitting: Podiatry

## 2022-06-21 ENCOUNTER — Other Ambulatory Visit: Payer: Self-pay | Admitting: Family Medicine

## 2022-07-01 ENCOUNTER — Ambulatory Visit: Payer: 59 | Admitting: Podiatry

## 2022-07-17 ENCOUNTER — Ambulatory Visit: Payer: 59 | Admitting: Podiatry

## 2022-07-24 DIAGNOSIS — R200 Anesthesia of skin: Secondary | ICD-10-CM | POA: Diagnosis not present

## 2022-08-12 ENCOUNTER — Ambulatory Visit: Payer: 59 | Admitting: Podiatry

## 2022-08-12 ENCOUNTER — Encounter: Payer: Self-pay | Admitting: Podiatry

## 2022-08-12 DIAGNOSIS — S93692A Other sprain of left foot, initial encounter: Secondary | ICD-10-CM

## 2022-08-12 NOTE — Progress Notes (Signed)
She states that is still acting very ugly she refers to the Planter fasciitis left foot.  Objective: Vital signs are stable she is alert and oriented x3.  Pulses are palpable.  She still has severe pain on palpation of the plantar fascia along the medial aspect of the left foot.  It does not feel as taut as it did previously.  I imagine that has a tear in it.  Assessment: Probable plantar fascial tear.  Plan: Requesting MRI of the rear foot at the plantar fascia calcaneal insertion site for evaluation of the plantar tear this is for differential diagnosis as well as surgical planning.

## 2022-08-14 ENCOUNTER — Other Ambulatory Visit (INDEPENDENT_AMBULATORY_CARE_PROVIDER_SITE_OTHER): Payer: Self-pay

## 2022-08-14 ENCOUNTER — Encounter (INDEPENDENT_AMBULATORY_CARE_PROVIDER_SITE_OTHER): Payer: Self-pay

## 2022-08-14 ENCOUNTER — Encounter (INDEPENDENT_AMBULATORY_CARE_PROVIDER_SITE_OTHER): Payer: Self-pay | Admitting: Gastroenterology

## 2022-08-14 ENCOUNTER — Ambulatory Visit (INDEPENDENT_AMBULATORY_CARE_PROVIDER_SITE_OTHER): Payer: 59 | Admitting: Gastroenterology

## 2022-08-14 ENCOUNTER — Telehealth (INDEPENDENT_AMBULATORY_CARE_PROVIDER_SITE_OTHER): Payer: Self-pay

## 2022-08-14 VITALS — BP 123/86 | HR 94 | Temp 97.8°F | Ht 63.0 in | Wt 175.6 lb

## 2022-08-14 DIAGNOSIS — K219 Gastro-esophageal reflux disease without esophagitis: Secondary | ICD-10-CM

## 2022-08-14 MED ORDER — PEG 3350-KCL-NA BICARB-NACL 420 G PO SOLR: 4000.0000 mL | ORAL | 0 refills | Status: DC

## 2022-08-14 NOTE — Progress Notes (Signed)
[name], M.D. Gastroenterology & Hepatology [facility] [address] [place], [place] [address]  Primary Care Physician: Kathyrn Drown, MD [place] [id number]  I will communicate my assessment and recommendations to the referring MD via EMR.  Problems: GERD   History of Present Illness: Stephanie Bishop is a 56 y.o. female with past medical history of GERD and hypertension, who presents for follow up of GERD and colorectal cancer screening.  The patient was last seen on 07/10/2021. At that time, the patient was advised to take Pepcid at bedtime instead of taking a second dose of PPI but she was advised to continue pantoprazole 40 mg every day.  She is taking pantoprazole 40 mg qday compliantly. She takes it first thing in the morning and waits at least 30 minutes to have breakfast. May have episodes of heartburn occasionally when she drinks too much coffee or when eating too much spicy food or chocolate, for which she takes an extra dose of pantoprazole 40 mg qday.  She reported she takes Tums every night for management of her osteoporosis, but not for prevention of heartburn.  The patient denies having any odynophagia, dysphagia, nausea, vomiting, fever, chills, hematochezia, melena, hematemesis, abdominal distention, abdominal pain, diarrhea, jaundice, pruritus or weight loss.  Last EGD: 2020 - Normal esophagus. - Z-line irregular, 37 cm from the incisors. - 2 cm hiatal hernia. - Erythematous mucosa in the antrum. Biopsied. - Normal duodenal bulb and second portion of the duodenum. Biopsied.  Last Colonoscopy: 2013 Prep excellent. Very tortuous sigmoid colon with single diverticulum. No polyps or other mucosal abnormalities noted. Normal rectal mucosa. Small hemorrhoids below the dentate line.  Past Medical History: Past Medical History:  Diagnosis Date   GERD (gastroesophageal reflux disease)     Hypertension    PONV (postoperative nausea and vomiting)     Past Surgical History: Past Surgical History:  Procedure Laterality Date   BIOPSY  05/12/2019   Procedure: BIOPSY;  Surgeon: Rogene Houston, MD;  [facility]: [facility];  Service: Endoscopy;;  duodenum, antrum   BREAST BIOPSY Left 2012   benign   BREAST CYST ASPIRATION Bilateral    CERVICAL SPINE SURGERY     2001   COLONOSCOPY  07/16/2012   Procedure: COLONOSCOPY;  Surgeon: Rogene Houston, MD;  Location: [facility];  Service: Endoscopy;  Laterality: N/A;  930   ESOPHAGOGASTRODUODENOSCOPY N/A 05/12/2019   Procedure: ESOPHAGOGASTRODUODENOSCOPY (EGD);  Surgeon: Rogene Houston, MD;  Location: [facility];  Service: Endoscopy;  Laterality: N/A;  245   FOOT SURGERY     06/2020    Family History: Family History  Problem Relation Age of Onset   Colon cancer Neg Hx     Social History: Social History   Tobacco Use  Smoking Status Never   Passive exposure: Never  Smokeless Tobacco Never   Social History   Substance and Sexual Activity  Alcohol Use No   Social History   Substance and Sexual Activity  Drug Use No    Allergies: Allergies  Allergen Reactions   Prednisone     Gives her migraines   Ciprofloxacin Other (See Comments)    Knots on Face     Medications: Current Outpatient Medications  Medication Sig Dispense Refill   amLODipine (NORVASC) 2.5 MG tablet Take 1 tablet by mouth once daily 90 tablet 0   cetirizine (ZYRTEC) 10 MG tablet Take 10 mg by mouth daily  as needed for allergies.      meloxicam (MOBIC) 15 MG tablet Take 1 tablet (15 mg total) by mouth daily. 30 tablet 3   pantoprazole (PROTONIX) 40 MG tablet Take one bid. NEEDS OFFICE VISIT 180 tablet 0   VITAMIN D PO Take by mouth. Does not take daily     No current facility-administered medications for this visit.    Review of Systems: GENERAL: negative for malaise, night sweats HEENT: No changes in hearing or vision, no nose  bleeds or other nasal problems. NECK: Negative for lumps, goiter, pain and significant neck swelling RESPIRATORY: Negative for cough, wheezing CARDIOVASCULAR: Negative for chest pain, leg swelling, palpitations, orthopnea GI: SEE HPI MUSCULOSKELETAL: Negative for joint pain or swelling, back pain, and muscle pain. SKIN: Negative for lesions, rash PSYCH: Negative for sleep disturbance, mood disorder and recent psychosocial stressors. HEMATOLOGY Negative for prolonged bleeding, bruising easily, and swollen nodes. ENDOCRINE: Negative for cold or heat intolerance, polyuria, polydipsia and goiter. NEURO: negative for tremor, gait imbalance, syncope and seizures. The remainder of the review of systems is noncontributory.   Physical Exam: BP 123/86 (BP Location: Left Arm, Patient Position: Sitting, Cuff Size: Large)   Pulse 94   Temp 97.8 F (36.6 C) (Oral)   Ht '5\' 3"'$  (1.6 m)   Wt 175 lb 9.6 oz (79.7 kg)   LMP 10/11/2018   BMI 31.11 kg/m  GENERAL: The patient is AO x3, in no acute distress. HEENT: Head is normocephalic and atraumatic. EOMI are intact. Mouth is well hydrated and without lesions. NECK: Supple. No masses LUNGS: Clear to auscultation. No presence of rhonchi/wheezing/rales. Adequate chest expansion HEART: RRR, normal s1 and s2. ABDOMEN: Soft, nontender, no guarding, no peritoneal signs, and nondistended. BS +. No masses. EXTREMITIES: Without any cyanosis, clubbing, rash, lesions or edema. NEUROLOGIC: AOx3, no focal motor deficit. SKIN: no jaundice, no rashes  Imaging/Labs: as above  I personally reviewed and interpreted the available labs, imaging and endoscopic files.  Impression and Plan: Stephanie Bishop is a 56 y.o. female with past medical history of GERD and hypertension, who presents for follow up of GERD and colorectal cancer screening.    Patient is due for colorectal cancer screening, for which we will proceed with a colonoscopy.  She has had adequate control  of her heartburn while taking pantoprazole every day, we discussed the possibility of continuing this dose for now and taking an extra dose at night if she presents breakthrough episodes as she has presented previously.  She can take this as needed as her symptoms are not frequent.  We also discussed nonpharmacologic options such as TIF, pamphlet was provided to her and she will read about it.  - Schedule colonoscopy - The patient and I held a thorough discussion about potential nonpharmacologic treatments for reflux which include Transoral Incisionless Fundoplication (TIF).  The benefits and risks, as well as prognosis with the use of the different modalities was thoroughly discussed with the patient who understood and agreed.  The patient will read more about these procedures and will let me know if interested to pursue this in the future. - Continue pantoprazole 40 mg every day, can take an extra dose as needed if still having heartburn episodes  All questions were answered.      [name], MD Gastroenterology and Hepatology [facility]

## 2022-08-14 NOTE — Telephone Encounter (Signed)
Ann Leeon Makar, CMA  ?

## 2022-08-14 NOTE — Patient Instructions (Addendum)
Schedule colonoscopy The patient and I held a thorough discussion about potential nonpharmacologic treatments for reflux which include Transoral Incisionless Fundoplication (TIF).  The benefits and risks, as well as prognosis with the use of the different modalities was thoroughly discussed with the patient who understood and agreed.  The patient will read more about these procedures and will let me know if interested to pursue this in the future. Continue pantoprazole 40 mg every day, can take an extra dose as needed if still having heartburn episodes

## 2022-08-19 ENCOUNTER — Ambulatory Visit (INDEPENDENT_AMBULATORY_CARE_PROVIDER_SITE_OTHER): Payer: 59 | Admitting: Gastroenterology

## 2022-08-28 ENCOUNTER — Ambulatory Visit
Admission: RE | Admit: 2022-08-28 | Discharge: 2022-08-28 | Disposition: A | Discharge status: Home / Self Care | Payer: 59 | Source: Ambulatory Visit | Attending: Podiatry | Admitting: Podiatry

## 2022-08-28 DIAGNOSIS — R600 Localized edema: Secondary | ICD-10-CM | POA: Diagnosis not present

## 2022-08-28 DIAGNOSIS — M722 Plantar fascial fibromatosis: Secondary | ICD-10-CM | POA: Diagnosis not present

## 2022-08-28 DIAGNOSIS — S93692A Other sprain of left foot, initial encounter: Secondary | ICD-10-CM

## 2022-09-09 ENCOUNTER — Telehealth: Payer: Self-pay | Admitting: *Deleted

## 2022-09-09 ENCOUNTER — Ambulatory Visit: Payer: 59 | Admitting: Podiatry

## 2022-09-09 ENCOUNTER — Encounter: Payer: Self-pay | Admitting: Podiatry

## 2022-09-09 DIAGNOSIS — M7662 Achilles tendinitis, left leg: Secondary | ICD-10-CM

## 2022-09-09 DIAGNOSIS — M722 Plantar fascial fibromatosis: Secondary | ICD-10-CM | POA: Diagnosis not present

## 2022-09-09 NOTE — Telephone Encounter (Signed)
-----   Message from Garrel Ridgel, [place] sent at 09/05/2022  7:27 AM EDT ----- Plantar fasciitis without tear but also demonstrates some achilles tendinitis. I would consider PT over surgery initially .

## 2022-09-09 NOTE — Progress Notes (Signed)
She presents today for follow-up of her MRI left foot.  She states that her left foot is hurting her so bad she is ready to have it cut off.  She states that she is ready for surgical intervention if possible.  Objective: Signs are stable she is alert and oriented x3 I reviewed her past medical history medications allergies surgeries and social history.  Pulses are strongly palpable.  She has severe pain on palpation medial calcaneal tubercle of the left heel and pain on medial-lateral compression of the calcaneus.  MRI demonstrates Planter fasciitis central band.  Assessment: Planter fasciitis bone marrow edema left heel.  Plan: At this point we consented her today for an endoscopic plantar fasciotomy left also for a PRP injection left foot.  She has had this done to her right foot so she fully understands the possible side effects and consequences associated with this kind of surgery we will go ahead and schedule her in the near future for surgical intervention.

## 2022-09-10 ENCOUNTER — Telehealth: Payer: Self-pay | Admitting: Podiatry

## 2022-09-10 ENCOUNTER — Encounter (INDEPENDENT_AMBULATORY_CARE_PROVIDER_SITE_OTHER): Payer: Self-pay | Admitting: *Deleted

## 2022-09-10 ENCOUNTER — Telehealth (INDEPENDENT_AMBULATORY_CARE_PROVIDER_SITE_OTHER): Payer: Self-pay | Admitting: *Deleted

## 2022-09-10 NOTE — Telephone Encounter (Signed)
Pt called in. She needed to change her procedure date and wanted it to be in January. Patient moved to 1/10 at 8:15am. Aware will send new instructions. Endo aware.

## 2022-09-10 NOTE — Telephone Encounter (Signed)
DOS: 09/20/2022  [employer] Effective 07/05/2022  Endoscopic Plantar Fasciotomy Lt ([id number]) PRP Injection Lt ([id number])  DX: M72.2  Deductible: $0 Out-of-Pocket: $765 with $272.21 remaining CoInsurance: 0%  Prior Authorization is Not required per Stephanie Bishop.  Call Reference #: [id number]

## 2022-09-14 ENCOUNTER — Encounter (INDEPENDENT_AMBULATORY_CARE_PROVIDER_SITE_OTHER): Payer: Self-pay | Admitting: Gastroenterology

## 2022-09-15 ENCOUNTER — Other Ambulatory Visit: Payer: Self-pay | Admitting: Family Medicine

## 2022-09-16 ENCOUNTER — Telehealth: Payer: Self-pay | Admitting: *Deleted

## 2022-09-16 NOTE — Telephone Encounter (Signed)
Patient is calling to ask if she is to discontinue an (OTC herbal all natural)that she has been taking. She did not leave the name of the medication, is having surgery on Friday.  Called and left message to call us back with the name of the medication.

## 2022-09-17 NOTE — Telephone Encounter (Signed)
Called patient , no answer, left voice message to discontinue all herbals now.

## 2022-09-18 ENCOUNTER — Other Ambulatory Visit: Payer: Self-pay | Admitting: Podiatry

## 2022-09-18 MED ORDER — OXYCODONE-ACETAMINOPHEN 10-325 MG PO TABS: 1.0000 | ORAL_TABLET | Freq: Three times a day (TID) | ORAL | 0 refills | Status: AC | PRN

## 2022-09-18 MED ORDER — ONDANSETRON HCL 4 MG PO TABS: 4.0000 mg | ORAL_TABLET | Freq: Three times a day (TID) | ORAL | 0 refills | Status: DC | PRN

## 2022-09-18 MED ORDER — CEPHALEXIN 500 MG PO CAPS: 500.0000 mg | ORAL_CAPSULE | Freq: Three times a day (TID) | ORAL | 0 refills | Status: DC

## 2022-09-20 DIAGNOSIS — M722 Plantar fascial fibromatosis: Secondary | ICD-10-CM | POA: Diagnosis not present

## 2022-09-20 DIAGNOSIS — G8918 Other acute postprocedural pain: Secondary | ICD-10-CM | POA: Diagnosis not present

## 2022-09-20 HISTORY — PX: FOOT SURGERY: SHX648

## 2022-09-25 ENCOUNTER — Ambulatory Visit (INDEPENDENT_AMBULATORY_CARE_PROVIDER_SITE_OTHER): Payer: 59 | Admitting: Podiatry

## 2022-09-25 DIAGNOSIS — M722 Plantar fascial fibromatosis: Secondary | ICD-10-CM

## 2022-09-25 NOTE — Progress Notes (Signed)
  Subjective:  Patient ID: Stephanie Bishop, female    DOB: 03-May-1966,  MRN: [id number]  Chief Complaint  Patient presents with   Routine Post Op    POV #1 DOS 09/20/2022 EPF LT, PRP INJECTION/DR [facility] PT    56 y.o. female returns for post-op check.  Overall doing well not having that much pain now, she knows if she is on too much it hurts  Review of Systems: Negative except as noted in the HPI. Denies N/V/F/Ch.   Objective:  There were no vitals filed for this visit. There is no height or weight on file to calculate BMI. Constitutional Well developed. Well nourished.  Vascular Foot warm and well perfused. Capillary refill normal to all digits.  Calf is soft and supple, no posterior calf or knee pain, negative Homans' sign  Neurologic Normal speech. Oriented to person, place, and time. Epicritic sensation to light touch grossly present bilaterally.  Dermatologic Skin healing well without signs of infection. Skin edges well coapted without signs of infection.  Orthopedic: Mild edema, there is minimal tenderness to palpation noted about the surgical site.   Assessment:   1. Plantar fasciitis of left foot    Plan:  Patient was evaluated and treated and all questions answered.  S/p foot surgery left -Progressing as expected post-operatively. -WB Status: WBAT in cam walker boot -Sutures: Return in 1 week for suture removal. -Medications: No refills required -Foot redressed.  May remove on Monday and shower  Return in about 1 week (around 10/02/2022) for suture removal, post op (no x-rays).

## 2022-10-01 DIAGNOSIS — D497 Neoplasm of unspecified behavior of endocrine glands and other parts of nervous system: Secondary | ICD-10-CM | POA: Diagnosis not present

## 2022-10-01 DIAGNOSIS — M542 Cervicalgia: Secondary | ICD-10-CM | POA: Diagnosis not present

## 2022-10-01 DIAGNOSIS — G43809 Other migraine, not intractable, without status migrainosus: Secondary | ICD-10-CM | POA: Diagnosis not present

## 2022-10-01 DIAGNOSIS — R200 Anesthesia of skin: Secondary | ICD-10-CM | POA: Diagnosis not present

## 2022-10-01 DIAGNOSIS — G8929 Other chronic pain: Secondary | ICD-10-CM | POA: Diagnosis not present

## 2022-10-02 ENCOUNTER — Ambulatory Visit (INDEPENDENT_AMBULATORY_CARE_PROVIDER_SITE_OTHER): Payer: 59 | Admitting: Podiatry

## 2022-10-02 ENCOUNTER — Encounter: Payer: Self-pay | Admitting: Podiatry

## 2022-10-02 DIAGNOSIS — M722 Plantar fascial fibromatosis: Secondary | ICD-10-CM

## 2022-10-02 DIAGNOSIS — Z9889 Other specified postprocedural states: Secondary | ICD-10-CM

## 2022-10-02 NOTE — Progress Notes (Signed)
She presents today for her first postop visit status post EPF left date of surgery 09/20/2022 states that it has been feeling pretty good.  Objective: Vital signs stable alert oriented x 3 there is no pain on palpation sutures were removed today margins remain well coapted.  Assessment well-healing surgical foot.  Plan: Will let her back into tennis shoes by the end of the week she has to continue to utilize her night splint and/or cam boot at nighttime for 1 month.  Will follow-up with her in 2 weeks.

## 2022-10-07 ENCOUNTER — Ambulatory Visit: Payer: 59 | Admitting: Podiatry

## 2022-10-08 ENCOUNTER — Other Ambulatory Visit: Payer: Self-pay | Admitting: Obstetrics and Gynecology

## 2022-10-08 DIAGNOSIS — Z1231 Encounter for screening mammogram for malignant neoplasm of breast: Secondary | ICD-10-CM

## 2022-10-09 DIAGNOSIS — N952 Postmenopausal atrophic vaginitis: Secondary | ICD-10-CM | POA: Diagnosis not present

## 2022-10-09 DIAGNOSIS — D1720 Benign lipomatous neoplasm of skin and subcutaneous tissue of unspecified limb: Secondary | ICD-10-CM | POA: Diagnosis not present

## 2022-10-09 DIAGNOSIS — Z01419 Encounter for gynecological examination (general) (routine) without abnormal findings: Secondary | ICD-10-CM | POA: Diagnosis not present

## 2022-10-09 DIAGNOSIS — Z6831 Body mass index (BMI) 31.0-31.9, adult: Secondary | ICD-10-CM | POA: Diagnosis not present

## 2022-10-09 DIAGNOSIS — M8580 Other specified disorders of bone density and structure, unspecified site: Secondary | ICD-10-CM | POA: Diagnosis not present

## 2022-10-16 ENCOUNTER — Encounter: Payer: Self-pay | Admitting: Podiatry

## 2022-10-16 ENCOUNTER — Ambulatory Visit (INDEPENDENT_AMBULATORY_CARE_PROVIDER_SITE_OTHER): Payer: 59 | Admitting: Podiatry

## 2022-10-16 DIAGNOSIS — M722 Plantar fascial fibromatosis: Secondary | ICD-10-CM

## 2022-10-16 NOTE — Progress Notes (Signed)
She presents today date of surgery 09/20/2022 endoscopic plantar fasciotomy with PRP injection states that is feeling really good she is very happy with the outcome thus far.  Continues to do her exercises and massage therapy.  Objective: Vital signs are stable alert oriented x 3 no reproducible pain on palpation medial calcaneal tubercle of the left heel.  Assessment: Resolving Planter fasciitis left.  Status post endoscopic plantar fasciotomy.  Plan: Continue at home physical therapy and the use of tennis shoes at all times continue use of the cam boot and I will follow-up with her in 2 weeks

## 2022-10-17 ENCOUNTER — Telehealth (INDEPENDENT_AMBULATORY_CARE_PROVIDER_SITE_OTHER): Payer: Self-pay

## 2022-10-17 ENCOUNTER — Other Ambulatory Visit: Payer: Self-pay | Admitting: Family Medicine

## 2022-10-17 ENCOUNTER — Encounter: Payer: Self-pay | Admitting: Family Medicine

## 2022-10-17 DIAGNOSIS — R1011 Right upper quadrant pain: Secondary | ICD-10-CM

## 2022-10-17 NOTE — Telephone Encounter (Signed)
Agree, thanks

## 2022-10-17 NOTE — Telephone Encounter (Signed)
Patient called today states she had been having some abdominal pain ongoing for the last 4-5 days. She wanted a US done on her if we could order it. She says she has had issues with what she thought was her gallbladder for years and Dr. Laural Golden and advised that if she had them reoccur to reach out to the office to set up a [place].She is having the abdominal pain, nausea, indigestion,and light colored stools. Per Dr. Jenetta Downer recommend to go to the Ed as we have no openings at either office today. I spoke with the patient and made her aware we did not have an opening and she would need to go to the ed, or possibly reach her pcp and see if they could see her and possibly order the Korea. Patient states understanding.

## 2022-10-18 ENCOUNTER — Ambulatory Visit (HOSPITAL_COMMUNITY)
Admission: RE | Admit: 2022-10-18 | Discharge: 2022-10-18 | Disposition: A | Discharge status: Home / Self Care | Payer: 59 | Source: Ambulatory Visit | Attending: Family Medicine | Admitting: Family Medicine

## 2022-10-18 DIAGNOSIS — R1011 Right upper quadrant pain: Secondary | ICD-10-CM | POA: Diagnosis not present

## 2022-10-18 DIAGNOSIS — K760 Fatty (change of) liver, not elsewhere classified: Secondary | ICD-10-CM | POA: Diagnosis not present

## 2022-10-18 NOTE — Telephone Encounter (Signed)
Coral Spikes, DO     I ordered the [place].

## 2022-10-21 ENCOUNTER — Encounter (INDEPENDENT_AMBULATORY_CARE_PROVIDER_SITE_OTHER): Payer: Self-pay | Admitting: Gastroenterology

## 2022-10-21 ENCOUNTER — Ambulatory Visit (INDEPENDENT_AMBULATORY_CARE_PROVIDER_SITE_OTHER): Payer: 59 | Admitting: Gastroenterology

## 2022-10-21 VITALS — BP 133/86 | HR 98 | Temp 97.5°F | Ht 63.0 in | Wt 174.9 lb

## 2022-10-21 DIAGNOSIS — R1110 Vomiting, unspecified: Secondary | ICD-10-CM

## 2022-10-21 DIAGNOSIS — K219 Gastro-esophageal reflux disease without esophagitis: Secondary | ICD-10-CM

## 2022-10-21 DIAGNOSIS — R1013 Epigastric pain: Secondary | ICD-10-CM

## 2022-10-21 MED ORDER — DICYCLOMINE HCL 10 MG PO CAPS: 10.0000 mg | ORAL_CAPSULE | Freq: Two times a day (BID) | ORAL | 1 refills | Status: DC | PRN

## 2022-10-21 NOTE — Progress Notes (Unsigned)
Stephanie Bishop, M.D. Gastroenterology & Hepatology [facility] [address] [place], [place] [address]  Primary Care Physician: Kathyrn Drown, MD [place] [address]  I will communicate my assessment and recommendations to the referring MD via EMR.  Problems: GERD  History of Present Illness: Stephanie Bishop is a 56 y.o. female with past medical history of GERD and hypertension, who presents for follow up of GERD and RUQ pain.   The patient was last seen on 08/14/2022. At that time, the patient was scheduled for colonoscopy. Was advised to continue pantoprazole 40 mg qday and to take an extra dose for breakthrough episodes.  Patient called on 10/17/22 for RUQ pain. Due to concern for possible biliary colic, she was ordered a RUQ [place], which was performed on 10/18/22 - there was presence of fatty liver but no other abnormalities.  She states in Friday 10/11/2022 she started presenting some worsening RUQ pain.  She states that due to her concern for significant upper discomfort and lighter stools, she decided to change her diet which did not lead to significant improvement of her symptoms. She reports that she was feeling relatively well prior to her symptoms started, although in November she was laying flat for prolonged periods as she had a foot surgery - she noticed some episodes of regurgitation of acidic and sour contents. However since her pain started on Friday, she has had recurrent episodes of regurgitation and sour taste in her mouth and heartburn despite taking pantoprazole 40 mg qday and Tums. This causes some nausea without vomiting. States that drinking anything makes the symptoms worse.  The patient denies having any  fever, chills, hematochezia, melena, hematemesis, abdominal distention, abdominal pain, diarrhea, jaundice, pruritus or weight loss.  Last EGD: 2020 - Normal esophagus. - Z-line irregular, 37  cm from the incisors. - 2 cm hiatal hernia. - Erythematous mucosa in the antrum. Biopsied. - Normal duodenal bulb and second portion of the duodenum. Biopsied.   Last Colonoscopy: 2013 Prep excellent. Very tortuous sigmoid colon with single diverticulum. No polyps or other mucosal abnormalities noted. Normal rectal mucosa. Small hemorrhoids below the dentate line.  Scheduled for repeat colonoscopy in 11/2022  Past Medical History: Past Medical History:  Diagnosis Date   GERD (gastroesophageal reflux disease)    Hypertension    PONV (postoperative nausea and vomiting)     Past Surgical History: Past Surgical History:  Procedure Laterality Date   BIOPSY  05/12/2019   Procedure: BIOPSY;  Surgeon: Rogene Houston, MD;  [facility];  Service: Endoscopy;;  duodenum, antrum   BREAST BIOPSY Left 2012   benign   BREAST CYST ASPIRATION Bilateral    CERVICAL SPINE SURGERY     2001   COLONOSCOPY  07/16/2012   Procedure: COLONOSCOPY;  Surgeon: Rogene Houston, MD;  [facility];  Service: Endoscopy;  Laterality: N/A;  930   ESOPHAGOGASTRODUODENOSCOPY N/A 05/12/2019   Procedure: ESOPHAGOGASTRODUODENOSCOPY (EGD);  Surgeon: Rogene Houston, MD;  [facility];  Service: Endoscopy;  Laterality: N/A;  60   FOOT SURGERY     06/2020    Family History:  Family History  Problem Relation Age of Onset   Colon cancer Neg Hx     Social History: Social History   Tobacco Use  Smoking Status Never   Passive exposure: Never  Smokeless Tobacco Never   Social History   Substance and Sexual Activity  Alcohol Use  No   Social History   Substance and Sexual Activity  Drug Use No    Allergies: Allergies  Allergen Reactions   Prednisone     Gives her migraines   Ciprofloxacin Other (See Comments)    Knots on Face     Medications: Current Outpatient Medications  Medication Sig Dispense Refill   amLODipine (NORVASC) 2.5 MG tablet Take 1 tablet by  mouth once daily 90 tablet 0   cetirizine (ZYRTEC) 10 MG tablet Take 10 mg by mouth daily as needed for allergies.      pantoprazole (PROTONIX) 40 MG tablet Take one bid. NEEDS OFFICE VISIT 180 tablet 0   VITAMIN D PO Take by mouth. Does not take daily     No current facility-administered medications for this visit.    Review of Systems: GENERAL: negative for malaise, night sweats HEENT: No changes in hearing or vision, no nose bleeds or other nasal problems. NECK: Negative for lumps, goiter, pain and significant neck swelling RESPIRATORY: Negative for cough, wheezing CARDIOVASCULAR: Negative for chest pain, leg swelling, palpitations, orthopnea GI: SEE HPI MUSCULOSKELETAL: Negative for joint pain or swelling, back pain, and muscle pain. SKIN: Negative for lesions, rash PSYCH: Negative for sleep disturbance, mood disorder and recent psychosocial stressors. HEMATOLOGY Negative for prolonged bleeding, bruising easily, and swollen nodes. ENDOCRINE: Negative for cold or heat intolerance, polyuria, polydipsia and goiter. NEURO: negative for tremor, gait imbalance, syncope and seizures. The remainder of the review of systems is noncontributory.   Physical Exam: BP 133/86 (BP Location: Left Arm, Patient Position: Sitting, Cuff Size: Large)   Pulse 98   Temp (!) 97.5 F (36.4 C) (Temporal)   Ht '5\' 3"'$  (1.6 m)   Wt 174 lb 14.4 oz (79.3 kg)   LMP 10/11/2018   BMI 30.98 kg/m  GENERAL: The patient is AO x3, in no acute distress. HEENT: Head is normocephalic and atraumatic. EOMI are intact. Mouth is well hydrated and without lesions. NECK: Supple. No masses LUNGS: Clear to auscultation. No presence of rhonchi/wheezing/rales. Adequate chest expansion HEART: RRR, normal s1 and s2. ABDOMEN: tender to palpation in epigastric area and mildly in the RUQ, no guarding, no peritoneal signs, and nondistended. BS +. No masses. EXTREMITIES: Without any cyanosis, clubbing, rash, lesions or  edema. NEUROLOGIC: AOx3, no focal motor deficit. SKIN: no jaundice, no rashes  Imaging/Labs: as above  I personally reviewed and interpreted the available labs, imaging and endoscopic files.  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  Patient had new onset RUQ and epigastric pain along with worsening of regurgitation/heartburn episodes acutely.  Due to this she underwent right upper quadrant ultrasound that did not show any hepatobiliary abnormalities besides fatty liver.  I explained to the patient that it is possible her symptoms may be related to peptic ulcer disease versus bowel hypersensitivity, for which we will explore this further with an EGD.  Patient will be prescribed Bentyl as needed for abdominal pain.  Given the severity of her regurgitation, we will increase her pantoprazole to twice a day dosing.  We discussed again the possibility of TIF as an option for refractory GERD.  -Schedule EGD -Start Bentyl 1 tablet q12h as needed for abdominal pain -Increase pantoprazole to 40 mg twice a day  All questions were answered.      Stephanie Peppers, MD Gastroenterology and Hepatology [facility]

## 2022-10-21 NOTE — Patient Instructions (Signed)
Schedule EGD Start Bentyl 1 tablet q12h as needed for abdominal pain Increase pantoprazole to 40 mg twice a day

## 2022-10-21 NOTE — H&P (View-Only) (Signed)
[name], M.D. Gastroenterology & Hepatology [facility] [address] [place], [place] [address]  Primary Care Physician: Kathyrn Drown, MD [place] [id number]  I will communicate my assessment and recommendations to the referring MD via EMR.  Problems: GERD  History of Present Illness: Stephanie Bishop is a 56 y.o. female with past medical history of GERD and hypertension, who presents for follow up of GERD and RUQ pain.   The patient was last seen on 08/14/2022. At that time, the patient was scheduled for colonoscopy. Was advised to continue pantoprazole 40 mg qday and to take an extra dose for breakthrough episodes.  Patient called on 10/17/22 for RUQ pain. Due to concern for possible biliary colic, she was ordered a RUQ [place], which was performed on 10/18/22 - there was presence of fatty liver but no other abnormalities.  She states in Friday 10/11/2022 she started presenting some worsening RUQ pain.  She states that due to her concern for significant upper discomfort and lighter stools, she decided to change her diet which did not lead to significant improvement of her symptoms. She reports that she was feeling relatively well prior to her symptoms started, although in November she was laying flat for prolonged periods as she had a foot surgery - she noticed some episodes of regurgitation of acidic and sour contents. However since her pain started on Friday, she has had recurrent episodes of regurgitation and sour taste in her mouth and heartburn despite taking pantoprazole 40 mg qday and Tums. This causes some nausea without vomiting. States that drinking anything makes the symptoms worse.  The patient denies having any  fever, chills, hematochezia, melena, hematemesis, abdominal distention, abdominal pain, diarrhea, jaundice, pruritus or weight loss.  Last EGD: 2020 - Normal esophagus. - Z-line irregular, 37  cm from the incisors. - 2 cm hiatal hernia. - Erythematous mucosa in the antrum. Biopsied. - Normal duodenal bulb and second portion of the duodenum. Biopsied.   Last Colonoscopy: 2013 Prep excellent. Very tortuous sigmoid colon with single diverticulum. No polyps or other mucosal abnormalities noted. Normal rectal mucosa. Small hemorrhoids below the dentate line.  Scheduled for repeat colonoscopy in 11/2022  Past Medical History: Past Medical History:  Diagnosis Date   GERD (gastroesophageal reflux disease)    Hypertension    PONV (postoperative nausea and vomiting)     Past Surgical History: Past Surgical History:  Procedure Laterality Date   BIOPSY  05/12/2019   Procedure: BIOPSY;  Surgeon: Rogene Houston, MD;  [facility];  Service: Endoscopy;;  duodenum, antrum   BREAST BIOPSY Left 2012   benign   BREAST CYST ASPIRATION Bilateral    CERVICAL SPINE SURGERY     2001   COLONOSCOPY  07/16/2012   Procedure: COLONOSCOPY;  Surgeon: Rogene Houston, MD;  [facility];  Service: Endoscopy;  Laterality: N/A;  930   ESOPHAGOGASTRODUODENOSCOPY N/A 05/12/2019   Procedure: ESOPHAGOGASTRODUODENOSCOPY (EGD);  Surgeon: Rogene Houston, MD;  [facility];  Service: Endoscopy;  Laterality: N/A;  67   FOOT SURGERY     06/2020    Family History:  Family History  Problem Relation Age of Onset   Colon cancer Neg Hx     Social History: Social History   Tobacco Use  Smoking Status Never   Passive exposure: Never  Smokeless Tobacco Never   Social History   Substance and Sexual Activity  Alcohol Use  No   Social History   Substance and Sexual Activity  Drug Use No    Allergies: Allergies  Allergen Reactions   Prednisone     Gives her migraines   Ciprofloxacin Other (See Comments)    Knots on Face     Medications: Current Outpatient Medications  Medication Sig Dispense Refill   amLODipine (NORVASC) 2.5 MG tablet Take 1 tablet by  mouth once daily 90 tablet 0   cetirizine (ZYRTEC) 10 MG tablet Take 10 mg by mouth daily as needed for allergies.      pantoprazole (PROTONIX) 40 MG tablet Take one bid. NEEDS OFFICE VISIT 180 tablet 0   VITAMIN D PO Take by mouth. Does not take daily     No current facility-administered medications for this visit.    Review of Systems: GENERAL: negative for malaise, night sweats HEENT: No changes in hearing or vision, no nose bleeds or other nasal problems. NECK: Negative for lumps, goiter, pain and significant neck swelling RESPIRATORY: Negative for cough, wheezing CARDIOVASCULAR: Negative for chest pain, leg swelling, palpitations, orthopnea GI: SEE HPI MUSCULOSKELETAL: Negative for joint pain or swelling, back pain, and muscle pain. SKIN: Negative for lesions, rash PSYCH: Negative for sleep disturbance, mood disorder and recent psychosocial stressors. HEMATOLOGY Negative for prolonged bleeding, bruising easily, and swollen nodes. ENDOCRINE: Negative for cold or heat intolerance, polyuria, polydipsia and goiter. NEURO: negative for tremor, gait imbalance, syncope and seizures. The remainder of the review of systems is noncontributory.   Physical Exam: BP 133/86 (BP Location: Left Arm, Patient Position: Sitting, Cuff Size: Large)   Pulse 98   Temp (!) 97.5 F (36.4 C) (Temporal)   Ht '5\' 3"'$  (1.6 m)   Wt 174 lb 14.4 oz (79.3 kg)   LMP 10/11/2018   BMI 30.98 kg/m  GENERAL: The patient is AO x3, in no acute distress. HEENT: Head is normocephalic and atraumatic. EOMI are intact. Mouth is well hydrated and without lesions. NECK: Supple. No masses LUNGS: Clear to auscultation. No presence of rhonchi/wheezing/rales. Adequate chest expansion HEART: RRR, normal s1 and s2. ABDOMEN: tender to palpation in epigastric area and mildly in the RUQ, no guarding, no peritoneal signs, and nondistended. BS +. No masses. EXTREMITIES: Without any cyanosis, clubbing, rash, lesions or  edema. NEUROLOGIC: AOx3, no focal motor deficit. SKIN: no jaundice, no rashes  Imaging/Labs: as above  I personally reviewed and interpreted the available labs, imaging and endoscopic files.  Impression and Plan: Stephanie Bishop is a 56 y.o. female with past medical history of GERD and hypertension, who presents for follow up of GERD and abdominal pain.  Patient had new onset RUQ and epigastric pain along with worsening of regurgitation/heartburn episodes acutely.  Due to this she underwent right upper quadrant ultrasound that did not show any hepatobiliary abnormalities besides fatty liver.  I explained to the patient that it is possible her symptoms may be related to peptic ulcer disease versus bowel hypersensitivity, for which we will explore this further with an EGD.  Patient will be prescribed Bentyl as needed for abdominal pain.  Given the severity of her regurgitation, we will increase her pantoprazole to twice a day dosing.  We discussed again the possibility of TIF as an option for refractory GERD.  -Schedule EGD -Start Bentyl 1 tablet q12h as needed for abdominal pain -Increase pantoprazole to 40 mg twice a day  All questions were answered.      [name], MD Gastroenterology and Hepatology [facility]

## 2022-10-30 ENCOUNTER — Other Ambulatory Visit: Payer: 59

## 2022-11-07 ENCOUNTER — Ambulatory Visit (INDEPENDENT_AMBULATORY_CARE_PROVIDER_SITE_OTHER): Payer: 59 | Admitting: Podiatry

## 2022-11-07 DIAGNOSIS — Z9889 Other specified postprocedural states: Secondary | ICD-10-CM

## 2022-11-07 NOTE — Progress Notes (Signed)
Patient presents today for post op visit # 4, patient of [facility].   POV #4 DOS 09/20/2022 EPF Left   Patient presents today ambulating with regular sneakers. Denies any falls or injury to the foot. No calf pain or shortness of breath. Denies any pain at this time.   Per providers orders patient is to continue using regular shoes and does not have to sleep with cam boot. Patient was notified that if she needs to use the cam boot for any reason she is able too.   Patient will follow up with Dr. Milinda Pointer in one month. Patient understood and does not have any further questions.

## 2022-11-13 ENCOUNTER — Ambulatory Visit (HOSPITAL_COMMUNITY)
Admission: RE | Admit: 2022-11-13 | Discharge: 2022-11-13 | Disposition: A | Discharge status: Home / Self Care | Payer: 59 | Source: Ambulatory Visit | Attending: Gastroenterology | Admitting: Gastroenterology

## 2022-11-13 ENCOUNTER — Ambulatory Visit (HOSPITAL_COMMUNITY): Payer: 59 | Admitting: Certified Registered Nurse Anesthetist

## 2022-11-13 ENCOUNTER — Encounter (HOSPITAL_COMMUNITY): Payer: Self-pay | Admitting: Gastroenterology

## 2022-11-13 ENCOUNTER — Encounter (HOSPITAL_COMMUNITY)
Admission: RE | Disposition: A | Discharge status: Home / Self Care | Payer: Self-pay | Source: Ambulatory Visit | Attending: Gastroenterology

## 2022-11-13 ENCOUNTER — Ambulatory Visit (HOSPITAL_BASED_OUTPATIENT_CLINIC_OR_DEPARTMENT_OTHER): Payer: 59 | Admitting: Certified Registered Nurse Anesthetist

## 2022-11-13 ENCOUNTER — Other Ambulatory Visit: Payer: Self-pay

## 2022-11-13 DIAGNOSIS — I10 Essential (primary) hypertension: Secondary | ICD-10-CM | POA: Insufficient documentation

## 2022-11-13 DIAGNOSIS — R1011 Right upper quadrant pain: Secondary | ICD-10-CM

## 2022-11-13 DIAGNOSIS — Z79899 Other long term (current) drug therapy: Secondary | ICD-10-CM | POA: Diagnosis not present

## 2022-11-13 DIAGNOSIS — K3189 Other diseases of stomach and duodenum: Secondary | ICD-10-CM | POA: Diagnosis not present

## 2022-11-13 DIAGNOSIS — D122 Benign neoplasm of ascending colon: Secondary | ICD-10-CM | POA: Insufficient documentation

## 2022-11-13 DIAGNOSIS — K31A19 Gastric intestinal metaplasia without dysplasia, unspecified site: Secondary | ICD-10-CM | POA: Diagnosis not present

## 2022-11-13 DIAGNOSIS — Z1211 Encounter for screening for malignant neoplasm of colon: Secondary | ICD-10-CM | POA: Insufficient documentation

## 2022-11-13 DIAGNOSIS — K219 Gastro-esophageal reflux disease without esophagitis: Secondary | ICD-10-CM | POA: Insufficient documentation

## 2022-11-13 DIAGNOSIS — K319 Disease of stomach and duodenum, unspecified: Secondary | ICD-10-CM | POA: Diagnosis not present

## 2022-11-13 DIAGNOSIS — K635 Polyp of colon: Secondary | ICD-10-CM

## 2022-11-13 DIAGNOSIS — K2950 Unspecified chronic gastritis without bleeding: Secondary | ICD-10-CM | POA: Diagnosis not present

## 2022-11-13 DIAGNOSIS — D123 Benign neoplasm of transverse colon: Secondary | ICD-10-CM | POA: Insufficient documentation

## 2022-11-13 DIAGNOSIS — K449 Diaphragmatic hernia without obstruction or gangrene: Secondary | ICD-10-CM | POA: Insufficient documentation

## 2022-11-13 HISTORY — PX: POLYPECTOMY: SHX5525

## 2022-11-13 HISTORY — PX: ESOPHAGOGASTRODUODENOSCOPY (EGD) WITH PROPOFOL: SHX5813

## 2022-11-13 HISTORY — PX: COLONOSCOPY WITH PROPOFOL: SHX5780

## 2022-11-13 HISTORY — PX: BIOPSY: SHX5522

## 2022-11-13 SURGERY — COLONOSCOPY WITH PROPOFOL
Anesthesia: General

## 2022-11-13 MED ORDER — PROPOFOL 500 MG/50ML IV EMUL
INTRAVENOUS | Status: DC | PRN
  Administered 2022-11-13: 180 ug/kg/min via INTRAVENOUS

## 2022-11-13 MED ORDER — LIDOCAINE HCL (CARDIAC) PF 100 MG/5ML IV SOSY
PREFILLED_SYRINGE | INTRAVENOUS | Status: DC | PRN
  Administered 2022-11-13: 100 mg via INTRATRACHEAL

## 2022-11-13 MED ORDER — PROPOFOL 10 MG/ML IV BOLUS
INTRAVENOUS | Status: DC | PRN
  Administered 2022-11-13: 80 mg via INTRAVENOUS

## 2022-11-13 MED ORDER — STERILE WATER FOR IRRIGATION IR SOLN
Status: DC | PRN
  Administered 2022-11-13: .6 mL

## 2022-11-13 MED ORDER — LACTATED RINGERS IV SOLN: INTRAVENOUS | Status: DC | PRN

## 2022-11-13 MED ORDER — LIDOCAINE HCL (PF) 2 % IJ SOLN
INTRAMUSCULAR | Status: AC
  Filled 2022-11-13: qty 5

## 2022-11-13 MED ORDER — PROPOFOL 500 MG/50ML IV EMUL
INTRAVENOUS | Status: AC
  Filled 2022-11-13: qty 50

## 2022-11-13 MED ORDER — LACTATED RINGERS IV SOLN: INTRAVENOUS | Status: DC

## 2022-11-13 NOTE — Op Note (Signed)
[facility] Patient Name: Stephanie Bishop Procedure Date: 11/13/2022 8:18 AM MRN: [id number] Date of Birth: January 01, 1966 Attending MD: [name] , , [id number] CSN: [id number] Age: 57 Admit Type: Outpatient Procedure:                Colonoscopy Indications:              Screening for colorectal malignant neoplasm Providers:                [name], Janeece Riggers, RN, Everardo Pacific Referring MD:              Medicines:                Monitored Anesthesia Care Complications:            No immediate complications. Estimated Blood Loss:     Estimated blood loss: none. Procedure:                Pre-Anesthesia Assessment:                           - Prior to the procedure, a History and Physical                            was performed, and patient medications, allergies                            and sensitivities were reviewed. The patient's                            tolerance of previous anesthesia was reviewed.                           - The risks and benefits of the procedure and the                            sedation options and risks were discussed with the                            patient. All questions were answered and informed                            consent was obtained.                           - ASA Grade Assessment: II - A patient with mild                            systemic disease.                           After obtaining informed consent, the colonoscope                            was passed under direct vision. Throughout the                            procedure, the patient's blood  pressure, pulse, and                            oxygen saturations were monitored continuously. The                            [id number]-HQ190L ([id number]) was introduced through the                            anus and advanced to the the terminal ileum. The                            colonoscopy was performed without difficulty. The                            patient  tolerated the procedure well. The quality                            of the bowel preparation was good. Scope In: 8:42:57 AM Scope Out: 9:12:41 AM Scope Withdrawal Time: 0 hours 17 minutes 15 seconds  Total Procedure Duration: 0 hours 29 minutes 44 seconds  Findings:      The perianal and digital rectal examinations were normal.      The terminal ileum appeared normal.      An 8 mm polyp was found in the ascending colon. The polyp was       semi-sessile. The polyp was removed with a cold snare. Resection and       retrieval were complete.      A 3 mm polyp was found in the transverse colon. The polyp was sessile.       The polyp was removed with a cold snare. Resection and retrieval were       complete.      The retroflexed view of the distal rectum and anal verge was normal and       showed no anal or rectal abnormalities. Impression:               - The examined portion of the ileum was normal.                           - One 8 mm polyp in the ascending colon, removed                            with a cold snare. Resected and retrieved.                           - One 3 mm polyp in the transverse colon, removed                            with a cold snare. Resected and retrieved.                           - The distal rectum and anal verge are normal on  retroflexion view. Moderate Sedation:      Per Anesthesia Care Recommendation:           - Discharge patient to home (ambulatory).                           - Resume previous diet.                           - Await pathology results.                           - Repeat colonoscopy for surveillance based on                            pathology results. Procedure Code(s):        --- Professional ---                           [phone], Colonoscopy, flexible; with removal of                            tumor(s), polyp(s), or other lesion(s) by snare                            technique Diagnosis Code(s):         --- Professional ---                           Z12.11, Encounter for screening for malignant                            neoplasm of colon                           D12.2, Benign neoplasm of ascending colon                           D12.3, Benign neoplasm of transverse colon (hepatic                            flexure or splenic flexure) CPT copyright 2022 American Medical Association. All rights reserved. The codes documented in this report are preliminary and upon coder review may  be revised to meet current compliance requirements. [name], MD [name],  11/13/2022 9:19:13 AM This report has been signed electronically. Number of Addenda: 0

## 2022-11-13 NOTE — Interval H&P Note (Signed)
History and Physical Interval Note:  11/13/2022 7:41 AM  Stephanie Bishop  has presented today for surgery, with the diagnosis of Screening colonoscopy.  The various methods of treatment have been discussed with the patient and family. After consideration of risks, benefits and other options for treatment, the patient has consented to  Procedure(s) with comments: COLONOSCOPY WITH PROPOFOL (N/A) - 800 ASA 2 ESOPHAGOGASTRODUODENOSCOPY (EGD) WITH PROPOFOL (N/A) as a surgical intervention.  The patient's history has been reviewed, patient examined, no change in status, stable for surgery.  I have reviewed the patient's chart and labs.  Questions were answered to the patient's satisfaction.     [name] [place]a

## 2022-11-13 NOTE — Transfer of Care (Signed)
Immediate Anesthesia Transfer of Care Note  Patient: Stephanie Bishop  Procedure(s) Performed: COLONOSCOPY WITH PROPOFOL ESOPHAGOGASTRODUODENOSCOPY (EGD) WITH PROPOFOL BIOPSY POLYPECTOMY  Patient Location: PACU  Anesthesia Type:General  Level of Consciousness: awake, alert , and oriented  Airway & Oxygen Therapy: Patient Spontanous Breathing  Post-op Assessment: Report given to RN, Post -op Vital signs reviewed and stable, Patient moving all extremities X 4, and Patient able to stick tongue midline  Post vital signs: Reviewed  Last Vitals:  Vitals Value Taken Time  BP 103/67   Temp 97.5   Pulse 75   Resp 22   SpO2 98     Last Pain:  Vitals:   11/13/22 0827  TempSrc:   PainSc: 0-No pain      Patients Stated Pain Goal: 7 (17/20/91 0681)  Complications: No notable events documented.

## 2022-11-13 NOTE — Op Note (Signed)
[facility] Patient Name: Stephanie Bishop Procedure Date: 11/13/2022 8:18 AM MRN: [id number] Date of Birth: Mar 19, 1966 Attending MD: [name] , , [id number] CSN: [id number] Age: 57 Admit Type: Outpatient Procedure:                Upper GI endoscopy Indications:              Abdominal pain in the right upper quadrant Providers:                [name], Janeece Riggers, RN, Everardo Pacific Referring MD:              Medicines:                Monitored Anesthesia Care Complications:            No immediate complications. Estimated Blood Loss:     Estimated blood loss: none. Procedure:                Pre-Anesthesia Assessment:                           - Prior to the procedure, a History and Physical                            was performed, and patient medications, allergies                            and sensitivities were reviewed. The patient's                            tolerance of previous anesthesia was reviewed.                           - The risks and benefits of the procedure and the                            sedation options and risks were discussed with the                            patient. All questions were answered and informed                            consent was obtained.                           - ASA Grade Assessment: II - A patient with mild                            systemic disease.                           After obtaining informed consent, the endoscope was                            passed under direct vision. Throughout the  procedure, the patient's blood pressure, pulse, and                            oxygen saturations were monitored continuously. The                            [id number]-H190 ([id number]) scope was introduced through the                            mouth, and advanced to the second part of duodenum.                            The upper GI endoscopy was accomplished without                             difficulty. The patient tolerated the procedure                            well. Scope In: 8:27:45 AM Scope Out: 8:37:10 AM Total Procedure Duration: 0 hours 9 minutes 25 seconds  Findings:      A 1 cm hiatal hernia was present.      The gastroesophageal flap valve was visualized endoscopically and       classified as Hill Grade II (fold present, opens with respiration).      The entire examined stomach was normal. Biopsies were taken with a cold       forceps for Helicobacter pylori testing.      The examined duodenum was normal. Biopsies were taken with a cold       forceps for histology. Impression:               - 1 cm hiatal hernia.                           - Gastroesophageal flap valve classified as Hill                            Grade II (fold present, opens with respiration).                           - Normal stomach. Biopsied.                           - Normal examined duodenum. Biopsied. Moderate Sedation:      Per Anesthesia Care Recommendation:           - Discharge patient to home (ambulatory).                           - Resume previous diet.                           - Await pathology results.                           - Continue present medications. Procedure Code(s):        --- Professional ---  20802, Esophagogastroduodenoscopy, flexible,                            transoral; with biopsy, single or multiple Diagnosis Code(s):        --- Professional ---                           K44.9, Diaphragmatic hernia without obstruction or                            gangrene                           R10.11, Right upper quadrant pain CPT copyright 2022 American Medical Association. All rights reserved. The codes documented in this report are preliminary and upon coder review may  be revised to meet current compliance requirements. [name], MD [name],  11/13/2022 8:41:53 AM This report has been signed electronically. Number of  Addenda: 0

## 2022-11-13 NOTE — Discharge Instructions (Addendum)
You are being discharged to home.  °Resume your previous diet.  °We are waiting for your pathology results.  °Continue your present medications.  °Your physician has recommended a repeat colonoscopy for surveillance based on pathology results.  °

## 2022-11-13 NOTE — Anesthesia Preprocedure Evaluation (Signed)
Anesthesia Evaluation  Patient identified by MRN, date of birth, ID band Patient awake    Reviewed: Allergy & Precautions, H&P , NPO status , Patient's Chart, lab work & pertinent test results, reviewed documented beta blocker date and time   History of Anesthesia Complications (+) PONV and history of anesthetic complications  Airway Mallampati: II  TM Distance: >3 FB Neck ROM: full    Dental no notable dental hx.    Pulmonary neg pulmonary ROS   Pulmonary exam normal breath sounds clear to auscultation       Cardiovascular Exercise Tolerance: Good hypertension, negative cardio ROS  Rhythm:regular Rate:Normal     Neuro/Psych  Headaches  Neuromuscular disease negative neurological ROS  negative psych ROS   GI/Hepatic negative GI ROS, Neg liver ROS,GERD  ,,  Endo/Other  negative endocrine ROS    Renal/GU negative Renal ROS  negative genitourinary   Musculoskeletal   Abdominal   Peds  Hematology negative hematology ROS (+)   Anesthesia Other Findings   Reproductive/Obstetrics negative OB ROS                             Anesthesia Physical Anesthesia Plan  ASA: 2  Anesthesia Plan: General   Post-op Pain Management:    Induction:   PONV Risk Score and Plan: Propofol infusion  Airway Management Planned:   Additional Equipment:   Intra-op Plan:   Post-operative Plan:   Informed Consent: I have reviewed the patients History and Physical, chart, labs and discussed the procedure including the risks, benefits and alternatives for the proposed anesthesia with the patient or authorized representative who has indicated his/her understanding and acceptance.     Dental Advisory Given  Plan Discussed with: CRNA  Anesthesia Plan Comments:        Anesthesia Quick Evaluation

## 2022-11-14 LAB — SURGICAL PATHOLOGY

## 2022-11-16 NOTE — Anesthesia Postprocedure Evaluation (Signed)
Anesthesia Post Note  Patient: Naylah Cork [place]oli  Procedure(s) Performed: COLONOSCOPY WITH PROPOFOL ESOPHAGOGASTRODUODENOSCOPY (EGD) WITH PROPOFOL BIOPSY POLYPECTOMY  Patient location during evaluation: Phase II Anesthesia Type: General Level of consciousness: awake Pain management: pain level controlled Vital Signs Assessment: post-procedure vital signs reviewed and stable Respiratory status: spontaneous breathing and respiratory function stable Cardiovascular status: blood pressure returned to baseline and stable Postop Assessment: no headache and no apparent nausea or vomiting Anesthetic complications: no Comments: Late entry   No notable events documented.   Last Vitals:  Vitals:   11/13/22 0720 11/13/22 0918  BP: 130/86 103/67  Pulse: 88 76  Resp: 18 16  Temp: 36.6 C 36.4 C  SpO2: 96% 98%    Last Pain:  Vitals:   11/13/22 0918  TempSrc: Oral  PainSc: 0-No pain                 Louann Sjogren

## 2022-11-18 ENCOUNTER — Encounter (HOSPITAL_COMMUNITY): Payer: Self-pay | Admitting: Gastroenterology

## 2022-12-04 ENCOUNTER — Ambulatory Visit
Admission: RE | Admit: 2022-12-04 | Discharge: 2022-12-04 | Disposition: A | Discharge status: Home / Self Care | Payer: 59 | Source: Ambulatory Visit | Attending: Obstetrics and Gynecology | Admitting: Obstetrics and Gynecology

## 2022-12-04 DIAGNOSIS — Z1231 Encounter for screening mammogram for malignant neoplasm of breast: Secondary | ICD-10-CM

## 2022-12-05 ENCOUNTER — Other Ambulatory Visit (INDEPENDENT_AMBULATORY_CARE_PROVIDER_SITE_OTHER): Payer: Self-pay | Admitting: Gastroenterology

## 2022-12-05 DIAGNOSIS — K219 Gastro-esophageal reflux disease without esophagitis: Secondary | ICD-10-CM

## 2022-12-11 ENCOUNTER — Ambulatory Visit (INDEPENDENT_AMBULATORY_CARE_PROVIDER_SITE_OTHER): Payer: 59 | Admitting: Podiatry

## 2022-12-11 ENCOUNTER — Encounter: Payer: Self-pay | Admitting: Podiatry

## 2022-12-11 ENCOUNTER — Ambulatory Visit: Payer: 59 | Admitting: Podiatry

## 2022-12-11 DIAGNOSIS — M722 Plantar fascial fibromatosis: Secondary | ICD-10-CM

## 2022-12-11 DIAGNOSIS — Z9889 Other specified postprocedural states: Secondary | ICD-10-CM

## 2022-12-11 NOTE — Progress Notes (Signed)
Presents today date of surgery September 20, 2022 EPF PRP injection states that she still gets a twinge across the top of the foot but the majority of the plan fasciitis is resolved.  Objective: Vital signs are stable alert oriented x 3.  Pulses are palpable.  She has no pain to palpation medial calcaneal tubercle.  Still has some tenderness on palpation of the medial portal entry.  This is PE appears to be healing very nicely see no signs of infection.  Assessment: Resolving endoscopic plantar fasciotomy symptomatology and neuritis.  Plan: She will follow-up with me on an as-needed basis.  Encourage massage therapy.

## 2023-01-06 ENCOUNTER — Encounter: Payer: Self-pay | Admitting: Family Medicine

## 2023-01-06 ENCOUNTER — Other Ambulatory Visit: Payer: Self-pay

## 2023-01-06 DIAGNOSIS — R079 Chest pain, unspecified: Secondary | ICD-10-CM

## 2023-01-06 DIAGNOSIS — I10 Essential (primary) hypertension: Secondary | ICD-10-CM

## 2023-01-06 NOTE — Telephone Encounter (Signed)
Nurses-please go ahead with referral to Dr. Domenic Polite Reason elevated blood pressure and chest pains thank you  Please let Meridy know referral has been placed hopefully they will see her within the next few weeks if she feels she needs to be seen sooner by [place] we can help her out thanks

## 2023-01-28 ENCOUNTER — Ambulatory Visit (INDEPENDENT_AMBULATORY_CARE_PROVIDER_SITE_OTHER): Payer: 59 | Admitting: Gastroenterology

## 2023-01-28 ENCOUNTER — Encounter (INDEPENDENT_AMBULATORY_CARE_PROVIDER_SITE_OTHER): Payer: Self-pay | Admitting: Gastroenterology

## 2023-01-28 VITALS — BP 131/87 | HR 87 | Temp 97.9°F | Ht 63.0 in | Wt 175.9 lb

## 2023-01-28 DIAGNOSIS — K219 Gastro-esophageal reflux disease without esophagitis: Secondary | ICD-10-CM | POA: Diagnosis not present

## 2023-01-28 DIAGNOSIS — K921 Melena: Secondary | ICD-10-CM

## 2023-01-28 NOTE — Patient Instructions (Signed)
Continue protonix 40mg  once daily Be mindful that greasy, spicy, fried, citrus foods, caffeine, carbonated drinks, chocolate and alcohol can increase reflux symptoms Stay upright 2-3 hours after eating, prior to lying down and avoid eating late in the evenings.  To ensure stools are soft and prevent constipation, Increase water intake, aim for atleast 64 oz per day Increase fruits, veggies and whole grains, kiwi and prunes are especially good for constipation Try to avoid straining and limit toilet time to no more than 5 minutes. If bleeding becomes worse, please let me know  Follow up 1 year  It was a pleasure to see you today. I want to create trusting relationships with patients and provide genuine, compassionate, and quality care. I truly value your feedback! please be on the lookout for a survey regarding your visit with me today. I appreciate your input about our visit and your time in completing this!     L. Alver Sorrow, MSN, APRN, AGNP-C Adult-Gerontology Nurse Practitioner [facility] at [facility]

## 2023-01-28 NOTE — Progress Notes (Addendum)
Referring Provider: Kathyrn Drown, MD Primary Care Physician:  Kathyrn Drown, MD Primary GI Physician: Jenetta Downer   Chief Complaint  Patient presents with   Results    Follow up on results of colonoscopy and endoscopy.    Gastroesophageal Reflux    Reports she keeps acid reflux. Does fine when she avoids trigger foods.    HPI:   Stephanie Bishop is a 57 y.o. female with past medical history of GERD and hypertension, who presents for follow up of GERD and RUQ pain.   Patient presenting today for follow up of GERD.  Last seen December 2023, at that time having some RUQ pain, lighter stools, changed diet with improvement in symptoms. She had some sour taste in her mouth and regurgitation, taking protonix 40mg  daily and tums.   Recommended to schedule EGD, start bentyl 10mg  q12h PRN for abd pain, increase pantoprazole to 40mg  BID.  Present:  patient states she is doing well with dietary changes. Feels GERD is well managed as long as she avoid trigger foods. Normally does well. She is taking pantoprazole 40mg  once daily, occasionally will repeat dosing if she eats something she should not. She denies any issues with dysphagia, no nausea or vomiting.   She is still having some occasional twinges in her RUQ area. She notes that this has been very mild, she has had no significant episodes of pain. Tends to be better controlled with her diet. Did not take bentyl as prescribed before due to concern for side effects.  She reports she is having very regular BMs. She has noted for the past few years, intermittent rectal bleeding. She has a prolapsed bladder and uterus. She notes seeing a swipe of blood on the toilet tissue maybe once every few months with a BM. She notes that she has a lot of pressure at times when she defecates though stools are usually not very hard, this is when the toilet tissue hematochezia occurs. Water intake is around 48 oz/day.   Patient had some questions about findings  on EGD and Colonoscopy which we discussed in depth   Last Colonoscopy:11/2022 - The examined portion of the ileum was normal.                           - One 8 mm polyp in the ascending colon                           - One 3 mm polyp in the transverse colon                           - The distal rectum and anal verge are normal on                            retroflexion view. (1 SSL and 1 TA) Last Endoscopy: 11/2022 - 1 cm hiatal hernia.                           - Gastroesophageal flap valve classified as Hill                            Grade II (fold present, opens with respiration).                           -  Normal stomach. Biopsied-focal intestinal metaplasia                           - Normal examined duodenum. Biopsied-Normal   Recommendations:  Repeat EGD 3 years, gastric mapping. Repeat colonoscopy 5 years   Past Medical History:  Diagnosis Date   GERD (gastroesophageal reflux disease)    Hypertension    PONV (postoperative nausea and vomiting)     Past Surgical History:  Procedure Laterality Date   BIOPSY  05/12/2019   Procedure: BIOPSY;  Surgeon: Rogene Houston, MD;  [facility];  Service: Endoscopy;;  duodenum, antrum   BIOPSY  11/13/2022   Procedure: BIOPSY;  Surgeon: Harvel Quale, MD;  [facility];  Service: Gastroenterology;;   BREAST BIOPSY Left 2012   benign   BREAST CYST ASPIRATION Bilateral    CERVICAL SPINE SURGERY     2001   COLONOSCOPY  07/16/2012   Procedure: COLONOSCOPY;  Surgeon: Rogene Houston, MD;  [facility];  Service: Endoscopy;  Laterality: N/A;  930   COLONOSCOPY WITH PROPOFOL N/A 11/13/2022   Procedure: COLONOSCOPY WITH PROPOFOL;  Surgeon: Harvel Quale, MD;  [facility];  Service: Gastroenterology;  Laterality: N/A;  800 ASA 2   ESOPHAGOGASTRODUODENOSCOPY N/A 05/12/2019   Procedure: ESOPHAGOGASTRODUODENOSCOPY (EGD);  Surgeon: Rogene Houston, MD;  [facility];  Service: Endoscopy;  Laterality: N/A;  245   ESOPHAGOGASTRODUODENOSCOPY (EGD) WITH PROPOFOL N/A 11/13/2022   Procedure: ESOPHAGOGASTRODUODENOSCOPY (EGD) WITH PROPOFOL;  Surgeon: Harvel Quale, MD;  [facility];  Service: Gastroenterology;  Laterality: N/A;   FOOT SURGERY Right    06/2020   FOOT SURGERY Left 09/20/2022   POLYPECTOMY  11/13/2022   Procedure: POLYPECTOMY;  Surgeon: Montez Morita, Quillian Quince, MD;  [facility];  Service: Gastroenterology;;    Current Outpatient Medications  Medication Sig Dispense Refill   acetaminophen (TYLENOL) 500 MG tablet Take 1,000 mg by mouth every 6 (six) hours as needed for moderate pain.     amLODipine (NORVASC) 2.5 MG tablet Take 1 tablet by mouth once daily 90 tablet 0   calcium elemental as carbonate (TUMS ULTRA 1000) 400 MG chewable tablet Chew 2,000 mg by mouth at bedtime.     cetirizine (ZYRTEC) 10 MG tablet Take 10 mg by mouth daily as needed for allergies.      Cholecalciferol (VITAMIN D) 50 MCG (2000 UT) tablet Take 2,000 Units by mouth every other day.     fluticasone (FLONASE) 50 MCG/ACT nasal spray Place 1 spray into both nostrils daily as needed for allergies or rhinitis.     hydrocortisone cream 1 % Apply 1 Application topically 2 (two) times daily as needed (dry / itchy skin).     Multiple Vitamin (MULTIVITAMIN WITH MINERALS) TABS tablet Take 1 tablet by mouth every other day.     pantoprazole (PROTONIX) 40 MG tablet Take 1 tablet by mouth twice daily 180 tablet 0   Calcium Carb-Cholecalciferol (CALCIUM 600 + D PO) Take 1 tablet by mouth every other day. (Patient not taking: Reported on 01/28/2023)     No current facility-administered medications for this visit.    Allergies as of 01/28/2023 - Review Complete 01/28/2023  Allergen Reaction Noted   Prednisone  02/22/2020   Ciprofloxacin Other (See Comments)     Family History  Problem Relation Age of Onset   Colon cancer Neg Hx  Social  History   Socioeconomic History   Marital status: Married    Spouse name: Not on file   Number of children: Not on file   Years of education: Not on file   Highest education level: Not on file  Occupational History   Not on file  Tobacco Use   Smoking status: Never    Passive exposure: Never   Smokeless tobacco: Never  Vaping Use   Vaping Use: Never used  Substance and Sexual Activity   Alcohol use: No   Drug use: No   Sexual activity: Not on file  Other Topics Concern   Not on file  Social History Narrative   Not on file   Social Determinants of Health   Financial Resource Strain: Not on file  Food Insecurity: Not on file  Transportation Needs: Not on file  Physical Activity: Not on file  Stress: Not on file  Social Connections: Not on file    Review of systems General: negative for malaise, night sweats, fever, chills, weight loss Neck: Negative for lumps, goiter, pain and significant neck swelling Resp: Negative for cough, wheezing, dyspnea at rest CV: Negative for chest pain, leg swelling, palpitations, orthopnea GI: denies melena, hematochezia, nausea, vomiting, diarrhea, dysphagia, odyonophagia, early satiety or unintentional weight loss. +toilet tissue hematochezia +constipation +occasional GERD symptoms  MSK: Negative for joint pain or swelling, back pain, and muscle pain. Derm: Negative for itching or rash Psych: Denies depression, anxiety, memory loss, confusion. No homicidal or suicidal ideation.  Heme: Negative for prolonged bleeding, bruising easily, and swollen nodes. Endocrine: Negative for cold or heat intolerance, polyuria, polydipsia and goiter. Neuro: negative for tremor, gait imbalance, syncope and seizures. The remainder of the review of systems is noncontributory.  Physical Exam: BP 131/87 (BP Location: Right Arm, Patient Position: Sitting, Cuff Size: Large)   Pulse 87   Temp 97.9 F (36.6 C) (Oral)   Ht 5\' 3"  (1.6 m)   Wt 175 lb 14.4 oz  (79.8 kg)   LMP 10/11/2018   BMI 31.16 kg/m  General:   Alert and oriented. No distress noted. Pleasant and cooperative.  Head:  Normocephalic and atraumatic. Eyes:  Conjuctiva clear without scleral icterus. Mouth:  Oral mucosa pink and moist. Good dentition. No lesions. Heart: Normal rate and rhythm, s1 and s2 heart sounds present.  Lungs: Clear lung sounds in all lobes. Respirations equal and unlabored. Abdomen:  +BS, soft, non-tender and non-distended. No rebound or guarding. No HSM or masses noted. Derm: No palmar erythema or jaundice Msk:  Symmetrical without gross deformities. Normal posture. Extremities:  Without edema. Neurologic:  Alert and  oriented x4 Psych:  Alert and cooperative. Normal mood and affect.  Invalid input(s): "6 MONTHS"   ASSESSMENT: Stephanie Bishop is a 57 y.o. female presenting today for follow up.  GERD/RUQ pain: GERD well controlled with diet and daily protonix 40mg . RUQ pain is only on occasion and very mild when it occurs, typically no pain as long as she avoids trigger foods. Recent EGD in January without obvious cause of her symptoms, previous RUQ [place] in December without GB etiology. Should continue with PPI daily and avoiding trigger foods   Toilet tissue hematochezia/Constipation: notes some toilet tissue hematochezia off and on for the past few years, usually occurring during times of straining to defecate. Does have history of prolapsed bladder and uterus.  She is up to date on colonoscopy. Has no associated pain. Suspect bleeding secondary to hemorrhoids in setting of constipation and  straining, however she should also discuss bleeding with her OB/GYN to rule out prolapsed uterus as a contributing factor as well.  She will be aware if bleeding becomes more significant.    PLAN:  Continue with dietary changes  2. Continue PPI daily  3. Increase water intake, aim for atleast 64 oz per day Increase fruits, veggies and whole grains, kiwi and prunes are  especially good for constipation 4. Avoid straining, limit toilet time to <5 minutes  5. Pt to let me know if bleeding becomes worse-should also discuss with GYN r/t uterus prolapse  All questions were answered, patient verbalized understanding and is in agreement with plan as outlined above.   Follow Up: 1 year    L. Alver Sorrow, MSN, APRN, AGNP-C Adult-Gerontology Nurse Practitioner [facility]  I have reviewed the note and agree with the APP's assessment as described in this progress note  [name], MD Gastroenterology and Hepatology [facility]

## 2023-02-28 ENCOUNTER — Other Ambulatory Visit (INDEPENDENT_AMBULATORY_CARE_PROVIDER_SITE_OTHER): Payer: Self-pay | Admitting: Gastroenterology

## 2023-02-28 DIAGNOSIS — K219 Gastro-esophageal reflux disease without esophagitis: Secondary | ICD-10-CM

## 2023-03-03 ENCOUNTER — Encounter: Payer: Self-pay | Admitting: Internal Medicine

## 2023-03-03 ENCOUNTER — Ambulatory Visit: Payer: 59 | Attending: Internal Medicine | Admitting: Internal Medicine

## 2023-03-03 VITALS — BP 122/78 | HR 78 | Ht 63.0 in | Wt 175.0 lb

## 2023-03-03 DIAGNOSIS — R0789 Other chest pain: Secondary | ICD-10-CM | POA: Diagnosis not present

## 2023-03-03 DIAGNOSIS — R079 Chest pain, unspecified: Secondary | ICD-10-CM | POA: Diagnosis not present

## 2023-03-03 NOTE — Progress Notes (Signed)
Cardiology Office Note  Date: 03/03/2023   ID: Stephanie Bishop, DOB 1966/05/04, MRN [id number]  PCP:  Babs Sciara, MD  Cardiologist:  Marjo Bicker, MD Electrophysiologist:  None   Reason for Office Visit: Evaluation of chest discomfort at the request of Dr. Johnney Killian   History of Present Illness: Stephanie Bishop is a 57 y.o. female known to have HTN, GERD postoperative cardiology clinic for evaluation of chest discomfort.  Patient has history of vestibular migraines. When she lifts heavy weights, she started to have discomfort around her left shoulder area radiating to left side of upper chest lasting for some time and resolving spontaneously. She also has these episodes sporadically at rest. Does not know if these discomforts are coming from her heart or from the menstrual migraines.  Denies any DOE, dizziness/syncope, leg swelling.  She does have occasional palpitations, feels like her heart is skipping a beat and sometimes has rapid palpitations lasting for a few seconds. Denies smoking cigarettes, drug use and alcohol use.  She does not have any family history of premature CAD but her brother passed away with stroke and had a heart attack before passing out.  No PCI or CABG in the family.  Past Medical History:  Diagnosis Date   GERD (gastroesophageal reflux disease)    Hypertension    PONV (postoperative nausea and vomiting)     Past Surgical History:  Procedure Laterality Date   BIOPSY  05/12/2019   Procedure: BIOPSY;  Surgeon: Malissa Hippo, MD;  [facility];  Service: Endoscopy;;  duodenum, antrum   BIOPSY  11/13/2022   Procedure: BIOPSY;  Surgeon: Dolores Frame, MD;  [facility];  Service: Gastroenterology;;   BREAST BIOPSY Left 2012   benign   BREAST CYST ASPIRATION Bilateral    CERVICAL SPINE SURGERY     2001   COLONOSCOPY  07/16/2012   Procedure: COLONOSCOPY;  Surgeon: Malissa Hippo, MD;  [facility];   Service: Endoscopy;  Laterality: N/A;  930   COLONOSCOPY WITH PROPOFOL N/A 11/13/2022   Procedure: COLONOSCOPY WITH PROPOFOL;  Surgeon: Dolores Frame, MD;  [facility];  Service: Gastroenterology;  Laterality: N/A;  800 ASA 2   ESOPHAGOGASTRODUODENOSCOPY N/A 05/12/2019   Procedure: ESOPHAGOGASTRODUODENOSCOPY (EGD);  Surgeon: Malissa Hippo, MD;  [facility];  Service: Endoscopy;  Laterality: N/A;  245   ESOPHAGOGASTRODUODENOSCOPY (EGD) WITH PROPOFOL N/A 11/13/2022   Procedure: ESOPHAGOGASTRODUODENOSCOPY (EGD) WITH PROPOFOL;  Surgeon: Dolores Frame, MD;  [facility];  Service: Gastroenterology;  Laterality: N/A;   FOOT SURGERY Right    06/2020   FOOT SURGERY Left 09/20/2022   POLYPECTOMY  11/13/2022   Procedure: POLYPECTOMY;  Surgeon: Marguerita Merles, Reuel Boom, MD;  [facility];  Service: Gastroenterology;;    Current Outpatient Medications  Medication Sig Dispense Refill   acetaminophen (TYLENOL) 500 MG tablet Take 1,000 mg by mouth every 6 (six) hours as needed for moderate pain.     amLODipine (NORVASC) 2.5 MG tablet Take 1 tablet by mouth once daily 90 tablet 0   calcium elemental as carbonate (TUMS ULTRA 1000) 400 MG chewable tablet Chew 2,000 mg by mouth at bedtime.     cetirizine (ZYRTEC) 10 MG tablet Take 10 mg by mouth daily as needed for allergies.      Cholecalciferol (VITAMIN D) 50 MCG (2000 UT) tablet Take 2,000 Units by mouth every other day.     fluticasone (FLONASE)  50 MCG/ACT nasal spray Place 1 spray into both nostrils daily as needed for allergies or rhinitis.     hydrocortisone cream 1 % Apply 1 Application topically 2 (two) times daily as needed (dry / itchy skin).     Multiple Vitamin (MULTIVITAMIN WITH MINERALS) TABS tablet Take 1 tablet by mouth every other day.     pantoprazole (PROTONIX) 40 MG tablet Take 1 tablet by mouth twice daily (Patient taking differently: Take 40 mg by mouth daily. Take 1  tablet by mouth twice daily) 180 tablet 3   No current facility-administered medications for this visit.   Allergies:  Prednisone and Ciprofloxacin   Social History: The patient  reports that she has never smoked. She has never been exposed to tobacco smoke. She has never used smokeless tobacco. She reports that she does not drink alcohol and does not use drugs.   Family History: The patient's family history is not on file.   ROS:  Please see the history of present illness. Otherwise, complete review of systems is positive for none.  All other systems are reviewed and negative.   Physical Exam: VS:  BP 122/78   Pulse 78   Ht 5\' 3"  (1.6 m)   Wt 175 lb (79.4 kg)   LMP 10/11/2018   SpO2 96%   BMI 31.00 kg/m , BMI Body mass index is 31 kg/m.  Wt Readings from Last 3 Encounters:  03/03/23 175 lb (79.4 kg)  01/28/23 175 lb 14.4 oz (79.8 kg)  11/13/22 170 lb (77.1 kg)    General: Patient appears comfortable at rest. HEENT: Conjunctiva and lids normal, oropharynx clear with moist mucosa. Neck: Supple, no elevated JVP or carotid bruits, no thyromegaly. Lungs: Clear to auscultation, nonlabored breathing at rest. Cardiac: Regular rate and rhythm, no S3 or significant systolic murmur, no pericardial rub. Abdomen: Soft, nontender, no hepatomegaly, bowel sounds present, no guarding or rebound. Extremities: No pitting edema, distal pulses 2+. Skin: Warm and dry. Musculoskeletal: No kyphosis. Neuropsychiatric: Alert and oriented x3, affect grossly appropriate.  Recent Labwork: No results found for requested labs within last 365 days.     Component Value Date/Time   CHOL 133 03/14/2021 0814   TRIG 54 03/14/2021 0814   HDL 76 03/14/2021 0814   CHOLHDL 1.8 03/14/2021 0814   LDLCALC 45 03/14/2021 0814    Assessment and Plan: Patient is a 57 year old F known to have HTN, GERD, vestibular migraines history was referred to the cardiology clinic for evaluation of chest discomfort.  #  Chest discomfort: When she lifts heavy weights, she starts to have discomfort around her left shoulder area radiating to left side of upper chest lasting for some time and resolving spontaneously. Sporadic episodes at rest as well. Obtain treadmill exercise Myoview. If patient cannot achieve target heart rate with exercise, can switch to [employer].  # HTN, controlled: Continue amlodipine 2.5 mg once daily.  I have spent a total of 45 minutes with patient reviewing chart, EKGs, labs and examining patient as well as establishing an assessment and plan that was discussed with the patient.  > 50% of time was spent in direct patient care.    Medication Adjustments/Labs and Tests Ordered: Current medicines are reviewed at length with the patient today.  Concerns regarding medicines are outlined above.   Tests Ordered: Orders Placed This Encounter  Procedures   EKG 12-Lead   Orders Placed This Encounter  Procedures   NM Myocar Multi W/Spect W/Wall Motion / EF   EKG 12-Lead  Medication Changes: No orders of the defined types were placed in this encounter.   Disposition:  Follow up  pending results  Signed,  Verne Spurr, MD, 03/03/2023 9:18 AM    [facility] at [facility] [address] [place], [place] [address]

## 2023-03-03 NOTE — Patient Instructions (Signed)
Medication Instructions:  Your physician recommends that you continue on your current medications as directed. Please refer to the Current Medication list given to you today.  *If you need a refill on your cardiac medications before your next appointment, please call your pharmacy*   Lab Work: None If you have labs (blood work) drawn today and your tests are completely normal, you will receive your results only by: MyChart Message (if you have MyChart) OR A paper copy in the mail If you have any lab test that is abnormal or we need to change your treatment, we will call you to review the results.   Testing/Procedures: Your physician has requested that you have a exercise myoview. For further information please visit [email or web address] Please follow instruction sheet, as given.    Follow-Up: At [facility], you and your health needs are our priority.  As part of our continuing mission to provide you with exceptional heart care, we have created designated Provider Care Teams.  These Care Teams include your primary Cardiologist (physician) and Advanced Practice Providers (APPs -  Physician Assistants and Nurse Practitioners) who all work together to provide you with the care you need, when you need it.  We recommend signing up for the patient portal called "MyChart".  Sign up information is provided on this After Visit Summary.  MyChart is used to connect with patients for Virtual Visits (Telemedicine).  Patients are able to view lab/test results, encounter notes, upcoming appointments, etc.  Non-urgent messages can be sent to your provider as well.   To learn more about what you can do with MyChart, go to [email or web address].    Your next appointment:    Pending Testing Results  Provider:   Luane School, MD    Other Instructions

## 2023-03-12 ENCOUNTER — Encounter (HOSPITAL_COMMUNITY): Payer: 59

## 2023-03-18 ENCOUNTER — Encounter (HOSPITAL_COMMUNITY): Payer: 59

## 2023-03-18 ENCOUNTER — Encounter (HOSPITAL_COMMUNITY): Payer: Self-pay

## 2023-04-17 ENCOUNTER — Other Ambulatory Visit: Payer: Self-pay | Admitting: Family Medicine

## 2023-05-30 ENCOUNTER — Encounter: Payer: Self-pay | Admitting: Family Medicine

## 2023-05-30 DIAGNOSIS — D352 Benign neoplasm of pituitary gland: Secondary | ICD-10-CM

## 2023-05-30 NOTE — Telephone Encounter (Signed)
Nurses Please order MRI of the brain pituitary protocol with and without contrast Diagnosis pituitary microadenoma  Please let patient know that you have initiated the process to get this test completed

## 2023-06-02 ENCOUNTER — Other Ambulatory Visit: Payer: Self-pay

## 2023-06-17 ENCOUNTER — Ambulatory Visit
Admission: RE | Admit: 2023-06-17 | Discharge: 2023-06-17 | Disposition: A | Discharge status: Home / Self Care | Payer: 59 | Source: Ambulatory Visit | Attending: Nurse Practitioner | Admitting: Nurse Practitioner

## 2023-06-17 VITALS — BP 145/89 | HR 102 | Temp 98.5°F | Resp 18

## 2023-06-17 DIAGNOSIS — J309 Allergic rhinitis, unspecified: Secondary | ICD-10-CM | POA: Diagnosis not present

## 2023-06-17 DIAGNOSIS — H6993 Unspecified Eustachian tube disorder, bilateral: Secondary | ICD-10-CM

## 2023-06-17 DIAGNOSIS — H6593 Unspecified nonsuppurative otitis media, bilateral: Secondary | ICD-10-CM | POA: Diagnosis not present

## 2023-06-17 MED ORDER — PREDNISONE 20 MG PO TABS: 40.0000 mg | ORAL_TABLET | Freq: Every day | ORAL | 0 refills | Status: AC

## 2023-06-17 MED ORDER — PSEUDOEPH-BROMPHEN-DM 30-2-10 MG/5ML PO SYRP: 5.0000 mL | ORAL_SOLUTION | Freq: Four times a day (QID) | ORAL | 0 refills | Status: DC | PRN

## 2023-06-17 NOTE — ED Triage Notes (Signed)
Left ear pain and nasal drainage since Friday.  States can't hear well out of that ear.

## 2023-06-17 NOTE — ED Provider Notes (Signed)
[facility]    CSN: [id number] Arrival date & time: 06/17/23  0944      History   Chief Complaint Chief Complaint  Patient presents with   Nasal Congestion    Ear pain, laryngitis, cough, drainage - Entered by patient    HPI Stephanie Bishop is a 57 y.o. female.   The history is provided by the patient.   Patient presents for complaints of bilateral ear pain and pressure, nasal congestion, nasal drainage, and cough.  Patient states symptoms started over the past several days.  She denies fever, chills, headache, sore throat, ear drainage, wheezing, shortness of breath, difficulty breathing, chest pain, abdominal pain, nausea, vomiting, or diarrhea.  Patient states that the pain is worse in her left ear.  She also states that she cannot hear well out of her ears.  She admits to hearing "popping, crackling, and swishing noises" in her ears.  Patient reports that she does have a history of seasonal allergies.  She has been taking Sudafed, cetirizine, and Flonase.  Past Medical History:  Diagnosis Date   GERD (gastroesophageal reflux disease)    Hypertension    PONV (postoperative nausea and vomiting)     Patient Active Problem List   Diagnosis Date Noted   Chest discomfort 03/03/2023   Hematochezia 01/28/2023   Right upper quadrant abdominal pain 11/13/2022   Special screening for malignant neoplasms, colon 11/13/2022   Pituitary adenoma (HCC) 05/20/2022   Otitis media 04/30/2022   Primary hypertension 03/07/2021   Cervico-occipital neuralgia 07/27/2019   Regurgitation of food 05/03/2019   Lipoma of axilla 01/07/2019   Prolapse of female genital organs 01/07/2019   Arthritis of first MTP joint 07/23/2018   Metatarsalgia of right foot 07/23/2018   Cervical radiculopathy 11/08/2016   Vertigo 11/08/2016   Cervical myopathy 07/31/2016   Neck pain 09/19/2015   Spondylolisthesis 09/19/2015   GERD 10/16/2010   EPIGASTRIC PAIN 10/16/2010    Past Surgical  History:  Procedure Laterality Date   BIOPSY  05/12/2019   Procedure: BIOPSY;  Surgeon: Malissa Hippo, MD;  Location: [facility];  Service: Endoscopy;;  duodenum, antrum   BIOPSY  11/13/2022   Procedure: BIOPSY;  Surgeon: Dolores Frame, MD;  Location: [facility];  Service: Gastroenterology;;   BREAST BIOPSY Left 2012   benign   BREAST CYST ASPIRATION Bilateral    CERVICAL SPINE SURGERY     2001   COLONOSCOPY  07/16/2012   Procedure: COLONOSCOPY;  Surgeon: Malissa Hippo, MD;  Location: [facility];  Service: Endoscopy;  Laterality: N/A;  930   COLONOSCOPY WITH PROPOFOL N/A 11/13/2022   Procedure: COLONOSCOPY WITH PROPOFOL;  Surgeon: Dolores Frame, MD;  [facility]: [facility];  Service: Gastroenterology;  Laterality: N/A;  800 ASA 2   ESOPHAGOGASTRODUODENOSCOPY N/A 05/12/2019   Procedure: ESOPHAGOGASTRODUODENOSCOPY (EGD);  Surgeon: Malissa Hippo, MD;  [facility]: [facility];  Service: Endoscopy;  Laterality: N/A;  245   ESOPHAGOGASTRODUODENOSCOPY (EGD) WITH PROPOFOL N/A 11/13/2022   Procedure: ESOPHAGOGASTRODUODENOSCOPY (EGD) WITH PROPOFOL;  Surgeon: Dolores Frame, MD;  [facility]: [facility];  Service: Gastroenterology;  Laterality: N/A;   FOOT SURGERY Right    06/2020   FOOT SURGERY Left 09/20/2022   POLYPECTOMY  11/13/2022   Procedure: POLYPECTOMY;  Surgeon: Dolores Frame, MD;  Location: [facility];  Service: Gastroenterology;;    OB History   No obstetric history on file.      Home Medications  Prior to Admission medications   Medication Sig Start Date End Date Taking? Authorizing Provider  brompheniramine-pseudoephedrine-DM 30-2-10 MG/5ML syrup Take 5 mLs by mouth 4 (four) times daily as needed. 06/17/23  Yes -Warren, Sadie Haber, NP  predniSONE (DELTASONE) 20 MG tablet Take 2 tablets (40 mg total) by mouth daily with breakfast for 5 days. 06/17/23 06/22/23 Yes -Warren, Sadie Haber, NP  acetaminophen  (TYLENOL) 500 MG tablet Take 1,000 mg by mouth every 6 (six) hours as needed for moderate pain.    [provider]  amLODipine (NORVASC) 2.5 MG tablet Take 1 tablet by mouth once daily 04/18/23   Babs Sciara, MD  calcium elemental as carbonate (TUMS ULTRA 1000) 400 MG chewable tablet Chew 2,000 mg by mouth at bedtime.    [provider]  cetirizine (ZYRTEC) 10 MG tablet Take 10 mg by mouth daily as needed for allergies.     [provider]  Cholecalciferol (VITAMIN D) 50 MCG (2000 UT) tablet Take 2,000 Units by mouth every other day.    [provider]  fluticasone (FLONASE) 50 MCG/ACT nasal spray Place 1 spray into both nostrils daily as needed for allergies or rhinitis.    [provider]  hydrocortisone cream 1 % Apply 1 Application topically 2 (two) times daily as needed (dry / itchy skin).    [provider]  Multiple Vitamin (MULTIVITAMIN WITH MINERALS) TABS tablet Take 1 tablet by mouth every other day.    [provider]  pantoprazole (PROTONIX) 40 MG tablet Take 1 tablet by mouth twice daily Patient taking differently: Take 40 mg by mouth daily. Take 1 tablet by mouth twice daily 02/28/23   Marguerita Merles, Reuel Boom, MD    Family History Family History  Problem Relation Age of Onset   Colon cancer Neg Hx     Social History Social History   Tobacco Use   Smoking status: Never    Passive exposure: Never   Smokeless tobacco: Never  Vaping Use   Vaping status: Never Used  Substance Use Topics   Alcohol use: No   Drug use: No     Allergies   Prednisone and Ciprofloxacin   Review of Systems Review of Systems Per HPI  Physical Exam Triage Vital Signs ED Triage Vitals  Encounter Vitals Group     BP 06/17/23 1014 (!) 145/89     Systolic BP Percentile --      Diastolic BP Percentile --      Pulse Rate 06/17/23 1014 (!) 102     Resp 06/17/23 1014 18     Temp 06/17/23 1014 98.5 F (36.9 C)     Temp  Source 06/17/23 1014 Oral     SpO2 06/17/23 1014 94 %     Weight --      Height --      Head Circumference --      Peak Flow --      Pain Score 06/17/23 1015 7     Pain Loc --      Pain Education --      Exclude from Growth Chart --    No data found.  Updated Vital Signs BP (!) 145/89 (BP Location: Right Arm)   Pulse (!) 102   Temp 98.5 F (36.9 C) (Oral)   Resp 18   LMP 10/11/2018   SpO2 94%   Visual Acuity Right Eye Distance:   Left Eye Distance:   Bilateral Distance:    Right Eye Near:  Left Eye Near:    Bilateral Near:     Physical Exam Vitals and nursing note reviewed.  Constitutional:      General: She is not in acute distress.    Appearance: Normal appearance.  HENT:     Head: Normocephalic.     Right Ear: Ear canal and external ear normal. A middle ear effusion is present.     Left Ear: Ear canal and external ear normal. A middle ear effusion is present.     Nose: Congestion present. No rhinorrhea.     Mouth/Throat:     Lips: Pink.     Mouth: Mucous membranes are moist.     Pharynx: Uvula midline. Postnasal drip present. No pharyngeal swelling, oropharyngeal exudate, posterior oropharyngeal erythema or uvula swelling.  Eyes:     Extraocular Movements: Extraocular movements intact.     Conjunctiva/sclera: Conjunctivae normal.     Pupils: Pupils are equal, round, and reactive to light.  Cardiovascular:     Rate and Rhythm: Regular rhythm. Tachycardia present.     Pulses: Normal pulses.     Heart sounds: Normal heart sounds.  Pulmonary:     Effort: Pulmonary effort is normal. No respiratory distress.     Breath sounds: Normal breath sounds. No stridor. No wheezing, rhonchi or rales.  Abdominal:     General: Bowel sounds are normal.     Palpations: Abdomen is soft.     Tenderness: There is no abdominal tenderness.  Musculoskeletal:     Cervical back: Normal range of motion.  Lymphadenopathy:     Cervical: No cervical adenopathy.  Skin:    General:  Skin is warm and dry.  Neurological:     General: No focal deficit present.     Mental Status: She is alert and oriented to person, place, and time.  Psychiatric:        Mood and Affect: Mood normal.        Behavior: Behavior normal.      UC Treatments / Results  Labs (all labs ordered are listed, but only abnormal results are displayed) Labs Reviewed - No data to display  EKG   Radiology No results found.  Procedures Procedures (including critical care time)  Medications Ordered in UC Medications - No data to display  Initial Impression / Assessment and Plan / UC Course  I have reviewed the triage vital signs and the nursing notes.  Pertinent labs & imaging results that were available during my care of the patient were reviewed by me and considered in my medical decision making (see chart for details).  The patient is well-appearing, she is in no acute distress, vital signs are stable.  On exam, patient with bilateral middle ear effusions.  She also has nasal congestion and postnasal drip which most likely have caused her symptoms.  She is currently taking cetirizine and Flonase and using Sudafed with minimal relief.  Will start patient on prednisone 40 mg for the next 5 days to help with eustachian tube dysfunction and allergic rhinitis, along with Bromfed-DM for her allergic rhinitis and cough.  Supportive care recommendations were provided and discussed with the patient to include over-the-counter analgesics for pain or discomfort, warm salt water gargles, and use of normal saline nasal spray throughout the day as needed.  Patient was advised to follow-up if symptoms fail to improve with this treatment, patient is in agreement with this plan of care and verbalizes understanding.  All questions were answered.  Patient stable for discharge.  Final Clinical Impressions(s) / UC Diagnoses   Final diagnoses:  Middle ear effusion, bilateral  Eustachian tube dysfunction,  bilateral  Allergic rhinitis, unspecified seasonality, unspecified trigger     Discharge Instructions      Take medication as directed.  Do not take ibuprofen, Aleve, Motrin, naproxen, Excedrin while taking the prednisone.  When she complete the prednisone, you can take ibuprofen if needed.  As discussed, stop the Sudafed you are currently taking.   Continue your current allergy medication regimen. Increase fluids and get plenty of rest. May take over-the-counter Tylenol as needed for pain, fever, or general discomfort. Recommend normal saline nasal spray to help with nasal congestion throughout the day. For your cough, it may be helpful to use a humidifier at bedtime during sleep. If symptoms are not improving over the next 7 to 10 days, or are suddenly worsening, please follow-up in this clinic or with your primary care physician for further evaluation. Follow-up as needed.      ED Prescriptions     Medication Sig Dispense Auth. Provider   brompheniramine-pseudoephedrine-DM 30-2-10 MG/5ML syrup Take 5 mLs by mouth 4 (four) times daily as needed. 140 mL -Warren, Sadie Haber, NP   predniSONE (DELTASONE) 20 MG tablet Take 2 tablets (40 mg total) by mouth daily with breakfast for 5 days. 10 tablet -Warren, Sadie Haber, NP      PDMP not reviewed this encounter.   Abran Cantor, NP 06/17/23 1035

## 2023-06-17 NOTE — Discharge Instructions (Signed)
Take medication as directed.  Do not take ibuprofen, Aleve, Motrin, naproxen, Excedrin while taking the prednisone.  When she complete the prednisone, you can take ibuprofen if needed.  As discussed, stop the Sudafed you are currently taking.   Continue your current allergy medication regimen. Increase fluids and get plenty of rest. May take over-the-counter Tylenol as needed for pain, fever, or general discomfort. Recommend normal saline nasal spray to help with nasal congestion throughout the day. For your cough, it may be helpful to use a humidifier at bedtime during sleep. If symptoms are not improving over the next 7 to 10 days, or are suddenly worsening, please follow-up in this clinic or with your primary care physician for further evaluation. Follow-up as needed.

## 2023-06-23 ENCOUNTER — Ambulatory Visit
Admission: RE | Admit: 2023-06-23 | Discharge: 2023-06-23 | Disposition: A | Discharge status: Home / Self Care | Payer: 59 | Source: Ambulatory Visit | Attending: Family Medicine | Admitting: Family Medicine

## 2023-06-23 VITALS — BP 130/88 | HR 98 | Temp 98.0°F | Resp 16

## 2023-06-23 DIAGNOSIS — J209 Acute bronchitis, unspecified: Secondary | ICD-10-CM

## 2023-06-23 MED ORDER — DEXAMETHASONE SODIUM PHOSPHATE 10 MG/ML IJ SOLN
10.0000 mg | Freq: Once | INTRAMUSCULAR | Status: AC
  Administered 2023-06-23: 10 mg via INTRAMUSCULAR

## 2023-06-23 MED ORDER — ALBUTEROL SULFATE HFA 108 (90 BASE) MCG/ACT IN AERS: 2.0000 | INHALATION_SPRAY | RESPIRATORY_TRACT | 0 refills | Status: DC | PRN

## 2023-06-23 NOTE — ED Triage Notes (Signed)
Cough that started a week ago. Was seen last week for ear pain and states her ears feel better but cough has not gone away. Took prescribed medications with little relief.

## 2023-06-23 NOTE — ED Provider Notes (Signed)
[facility]    CSN: [id number] Arrival date & time: 06/23/23  1341      History   Chief Complaint Chief Complaint  Patient presents with   Cough    Came in last week - the issue with my ears is better, but the cough is literally all the time.  I can't sleep due to this coughing - seems like its more bronchial now. - Entered by patient    HPI Stephanie Bishop is a 57 y.o. female.   Presenting today with lingering hacking cough worse at night.  Denies chest pain, shortness of breath, abdominal pain, nausea vomiting diarrhea, fevers.  So far trying allergy regimen, Bromfed syrup with minimal relief.  Was on prednisone last week for middle ear effusion and states the cough did improve while on this.    Past Medical History:  Diagnosis Date   GERD (gastroesophageal reflux disease)    Hypertension    PONV (postoperative nausea and vomiting)     Patient Active Problem List   Diagnosis Date Noted   Chest discomfort 03/03/2023   Hematochezia 01/28/2023   Right upper quadrant abdominal pain 11/13/2022   Special screening for malignant neoplasms, colon 11/13/2022   Pituitary adenoma (HCC) 05/20/2022   Otitis media 04/30/2022   Primary hypertension 03/07/2021   Cervico-occipital neuralgia 07/27/2019   Regurgitation of food 05/03/2019   Lipoma of axilla 01/07/2019   Prolapse of female genital organs 01/07/2019   Arthritis of first MTP joint 07/23/2018   Metatarsalgia of right foot 07/23/2018   Cervical radiculopathy 11/08/2016   Vertigo 11/08/2016   Cervical myopathy 07/31/2016   Neck pain 09/19/2015   Spondylolisthesis 09/19/2015   GERD 10/16/2010   EPIGASTRIC PAIN 10/16/2010    Past Surgical History:  Procedure Laterality Date   BIOPSY  05/12/2019   Procedure: BIOPSY;  Surgeon: Malissa Hippo, MD;  Location: [facility];  Service: Endoscopy;;  duodenum, antrum   BIOPSY  11/13/2022   Procedure: BIOPSY;  Surgeon: Dolores Frame, MD;  Location:  [facility];  Service: Gastroenterology;;   BREAST BIOPSY Left 2012   benign   BREAST CYST ASPIRATION Bilateral    CERVICAL SPINE SURGERY     2001   COLONOSCOPY  07/16/2012   Procedure: COLONOSCOPY;  Surgeon: Malissa Hippo, MD;  Location: [facility];  Service: Endoscopy;  Laterality: N/A;  930   COLONOSCOPY WITH PROPOFOL N/A 11/13/2022   Procedure: COLONOSCOPY WITH PROPOFOL;  Surgeon: Dolores Frame, MD;  [facility]: [facility];  Service: Gastroenterology;  Laterality: N/A;  800 ASA 2   ESOPHAGOGASTRODUODENOSCOPY N/A 05/12/2019   Procedure: ESOPHAGOGASTRODUODENOSCOPY (EGD);  Surgeon: Malissa Hippo, MD;  [facility]: [facility];  Service: Endoscopy;  Laterality: N/A;  245   ESOPHAGOGASTRODUODENOSCOPY (EGD) WITH PROPOFOL N/A 11/13/2022   Procedure: ESOPHAGOGASTRODUODENOSCOPY (EGD) WITH PROPOFOL;  Surgeon: Dolores Frame, MD;  [facility]: [facility];  Service: Gastroenterology;  Laterality: N/A;   FOOT SURGERY Right    06/2020   FOOT SURGERY Left 09/20/2022   POLYPECTOMY  11/13/2022   Procedure: POLYPECTOMY;  Surgeon: Dolores Frame, MD;  [facility]: [facility];  Service: Gastroenterology;;    OB History   No obstetric history on file.      Home Medications    Prior to Admission medications   Medication Sig Start Date End Date Taking? Authorizing Provider  acetaminophen (TYLENOL) 500 MG tablet Take 1,000 mg by mouth every 6 (six) hours as needed for moderate pain.  Yes [provider]  albuterol (VENTOLIN HFA) 108 (90 Base) MCG/ACT inhaler Inhale 2 puffs into the lungs every 4 (four) hours as needed for wheezing or shortness of breath. 06/23/23  Yes Particia Nearing, PA-C  amLODipine (NORVASC) 2.5 MG tablet Take 1 tablet by mouth once daily 04/18/23  Yes Luking, Jonna Coup, MD  brompheniramine-pseudoephedrine-DM 30-2-10 MG/5ML syrup Take 5 mLs by mouth 4 (four) times daily as needed. 06/17/23  Yes Leath-Warren, Sadie Haber, NP   calcium elemental as carbonate (TUMS ULTRA 1000) 400 MG chewable tablet Chew 2,000 mg by mouth at bedtime.   Yes [provider]  cetirizine (ZYRTEC) 10 MG tablet Take 10 mg by mouth daily as needed for allergies.    Yes [provider]  Cholecalciferol (VITAMIN D) 50 MCG (2000 UT) tablet Take 2,000 Units by mouth every other day.   Yes [provider]  fluticasone (FLONASE) 50 MCG/ACT nasal spray Place 1 spray into both nostrils daily as needed for allergies or rhinitis.   Yes [provider]  hydrocortisone cream 1 % Apply 1 Application topically 2 (two) times daily as needed (dry / itchy skin).   Yes [provider]  Multiple Vitamin (MULTIVITAMIN WITH MINERALS) TABS tablet Take 1 tablet by mouth every other day.   Yes [provider]  pantoprazole (PROTONIX) 40 MG tablet Take 1 tablet by mouth twice daily Patient taking differently: Take 40 mg by mouth daily. Take 1 tablet by mouth twice daily 02/28/23  Yes Marguerita Merles, Reuel Boom, MD    Family History Family History  Problem Relation Age of Onset   Colon cancer Neg Hx     Social History Social History   Tobacco Use   Smoking status: Never    Passive exposure: Never   Smokeless tobacco: Never  Vaping Use   Vaping status: Never Used  Substance Use Topics   Alcohol use: No   Drug use: No     Allergies   Prednisone and Ciprofloxacin   Review of Systems Review of Systems PER HPI  Physical Exam Triage Vital Signs ED Triage Vitals  Encounter Vitals Group     BP 06/23/23 1409 130/88     Systolic BP Percentile --      Diastolic BP Percentile --      Pulse Rate 06/23/23 1409 98     Resp 06/23/23 1409 16     Temp 06/23/23 1409 98 F (36.7 C)     Temp Source 06/23/23 1409 Oral     SpO2 06/23/23 1409 95 %     Weight --      Height --      Head Circumference --      Peak Flow --      Pain Score 06/23/23 1410 0     Pain Loc --      Pain Education --      Exclude  from Growth Chart --    No data found.  Updated Vital Signs BP 130/88 (BP Location: Right Arm)   Pulse 98   Temp 98 F (36.7 C) (Oral)   Resp 16   LMP 10/11/2018   SpO2 95%   Visual Acuity Right Eye Distance:   Left Eye Distance:   Bilateral Distance:    Right Eye Near:   Left Eye Near:    Bilateral Near:     Physical Exam Vitals and nursing note reviewed.  Constitutional:      Appearance: Normal appearance.  HENT:  Head: Atraumatic.     Right Ear: Tympanic membrane and external ear normal.     Left Ear: Tympanic membrane and external ear normal.     Nose: Nose normal.     Mouth/Throat:     Mouth: Mucous membranes are moist.     Pharynx: Posterior oropharyngeal erythema present.  Eyes:     Extraocular Movements: Extraocular movements intact.     Conjunctiva/sclera: Conjunctivae normal.  Cardiovascular:     Rate and Rhythm: Normal rate and regular rhythm.     Heart sounds: Normal heart sounds.  Pulmonary:     Effort: Pulmonary effort is normal.     Breath sounds: Normal breath sounds. No wheezing or rales.  Musculoskeletal:        General: Normal range of motion.     Cervical back: Normal range of motion and neck supple.  Skin:    General: Skin is warm and dry.  Neurological:     Mental Status: She is alert and oriented to person, place, and time.  Psychiatric:        Mood and Affect: Mood normal.        Thought Content: Thought content normal.      UC Treatments / Results  Labs (all labs ordered are listed, but only abnormal results are displayed) Labs Reviewed - No data to display  EKG   Radiology No results found.  Procedures Procedures (including critical care time)  Medications Ordered in UC Medications  dexamethasone (DECADRON) injection 10 mg (10 mg Intramuscular Given 06/23/23 1444)    Initial Impression / Assessment and Plan / UC Course  I have reviewed the triage vital signs and the nursing notes.  Pertinent labs & imaging  results that were available during my care of the patient were reviewed by me and considered in my medical decision making (see chart for details).     Suspect some lingering inflammatory bronchitis.  Treat with IM Decadron, albuterol, Bromfed cough syrup and supportive home care.  Return for worsening symptoms. Final Clinical Impressions(s) / UC Diagnoses   Final diagnoses:  Acute bronchitis, unspecified organism   Discharge Instructions   None    ED Prescriptions     Medication Sig Dispense Auth. Provider   albuterol (VENTOLIN HFA) 108 (90 Base) MCG/ACT inhaler Inhale 2 puffs into the lungs every 4 (four) hours as needed for wheezing or shortness of breath. 18 g Particia Nearing, [place]      PDMP not reviewed this encounter.   Particia Nearing, [place] 06/23/23 1451

## 2023-06-26 DIAGNOSIS — R2233 Localized swelling, mass and lump, upper limb, bilateral: Secondary | ICD-10-CM | POA: Diagnosis not present

## 2023-06-27 ENCOUNTER — Other Ambulatory Visit: Payer: Self-pay | Admitting: Surgery

## 2023-06-27 DIAGNOSIS — R2233 Localized swelling, mass and lump, upper limb, bilateral: Secondary | ICD-10-CM

## 2023-07-04 ENCOUNTER — Encounter: Payer: Self-pay | Admitting: Family Medicine

## 2023-07-07 ENCOUNTER — Other Ambulatory Visit: Payer: Self-pay | Admitting: Family Medicine

## 2023-07-13 ENCOUNTER — Ambulatory Visit
Admission: RE | Admit: 2023-07-13 | Discharge: 2023-07-13 | Disposition: A | Discharge status: Home / Self Care | Payer: 59 | Source: Ambulatory Visit | Attending: Family Medicine | Admitting: Family Medicine

## 2023-07-13 DIAGNOSIS — D352 Benign neoplasm of pituitary gland: Secondary | ICD-10-CM | POA: Diagnosis not present

## 2023-07-13 MED ORDER — GADOPICLENOL 0.5 MMOL/ML IV SOLN
7.5000 mL | Freq: Once | INTRAVENOUS | Status: AC | PRN
  Administered 2023-07-13: 7.5 mL via INTRAVENOUS

## 2023-07-21 IMAGING — MR MR CERVICAL SPINE W/O CM
5 of 6 series · 34 of 48 positions shown · non-contrast
Comparison: Radiography 07/27/2019.  MRI 11/08/2016.

CLINICAL DATA: Neck pain. Chronic cervical pain without evidence of
disc disease.

EXAM:
MRI CERVICAL SPINE WITHOUT CONTRAST
TECHNIQUE: Multiplanar, multisequence MR imaging of the cervical spine was
performed. No intravenous contrast was administered.

[Series 5: T1 · sagittal · 3.0mm · 0.69mm/px · 6 of 17 slices shown (1 of 2)]
[im 1/17]
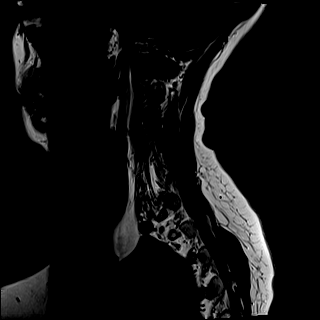
[im 4/17]
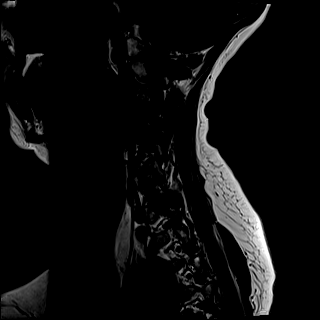
[im 7/17]
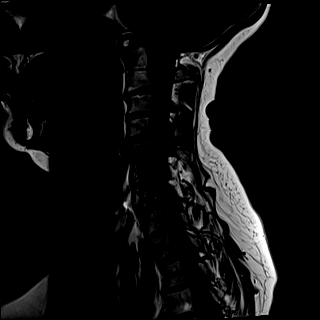
[im 10/17]
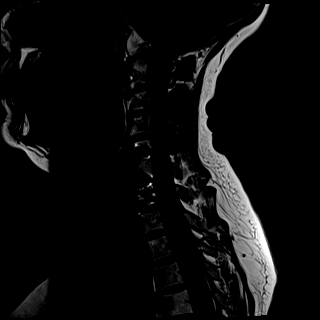
[im 13/17]
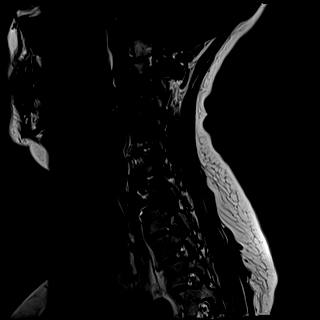
[im 17/17]
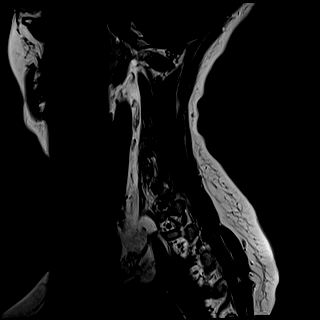

[Series 6: T2 · sagittal · 3.0mm · 0.69mm/px · 6 of 17 slices shown (1 of 2)]
[im 1/17]
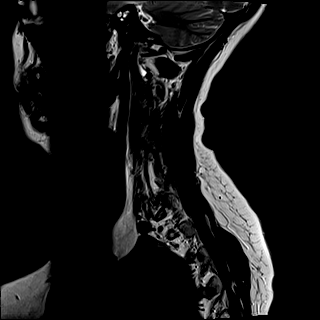
[im 4/17]
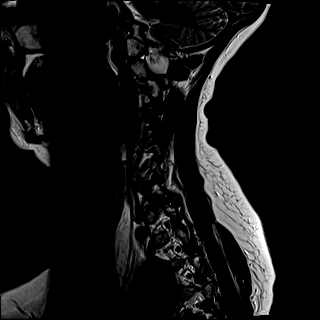
[im 7/17]
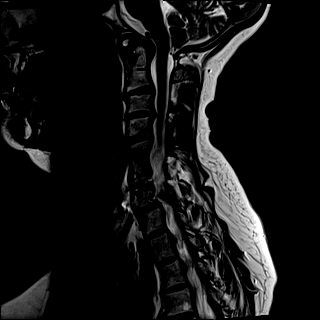
[im 10/17]
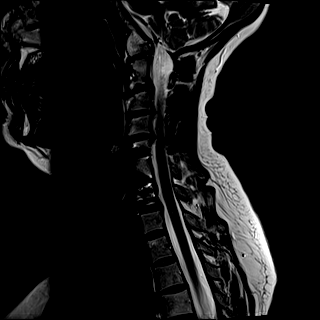
[im 13/17]
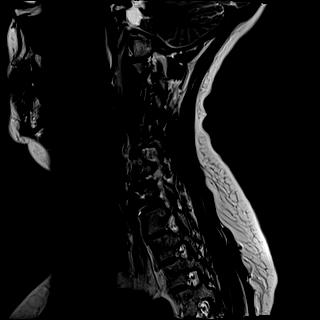
[im 17/17]
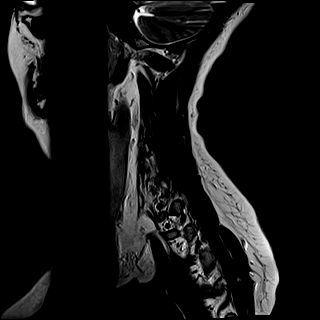

[Series 7: STIR · sagittal · 3.0mm · 0.86mm/px · 6 of 17 slices shown]
[im 1/17]
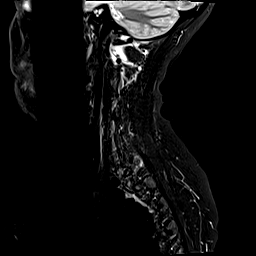
[im 4/17]
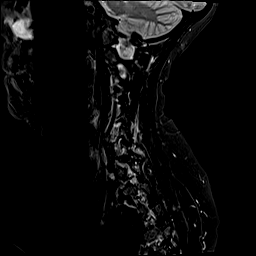
[im 7/17]
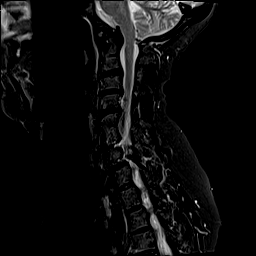
[im 10/17]
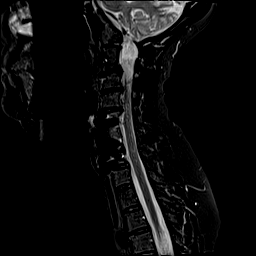
[im 13/17]
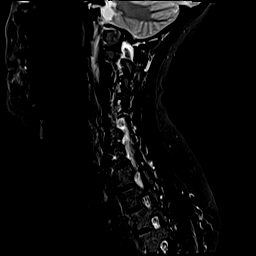
[im 17/17]
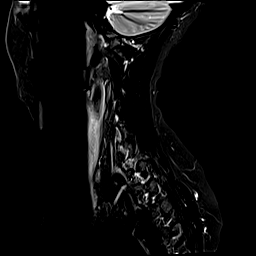

[Series 8: T2 · axial · 3.0mm · 0.70mm/px · z∈[-191,-91]mm · 8 of 30 slices shown (2 of 2)]
[im 1/30]
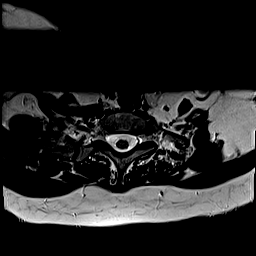
[im 4/30]
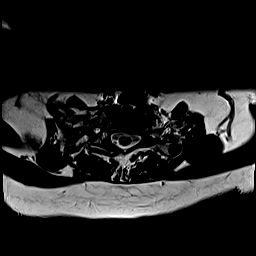
[im 10/30]
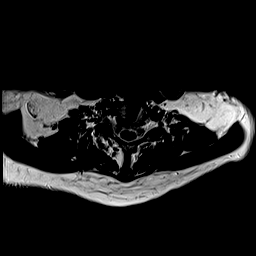
[im 13/30]
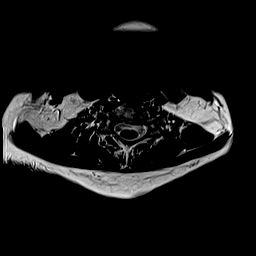
[im 17/30]
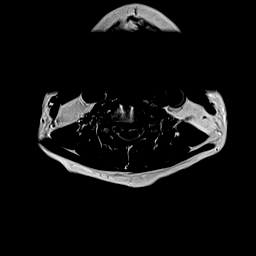
[im 20/30]
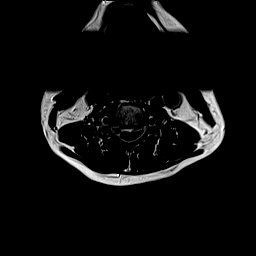
[im 26/30]
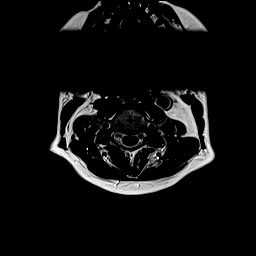
[im 30/30]
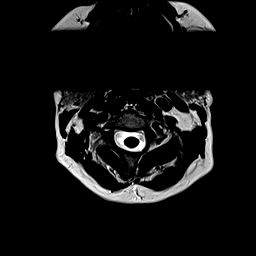

[Series 10: T1 · axial · 3.0mm · 0.35mm/px · z∈[-191,-91]mm · 8 of 30 slices shown (2 of 2)]
[im 1/30]
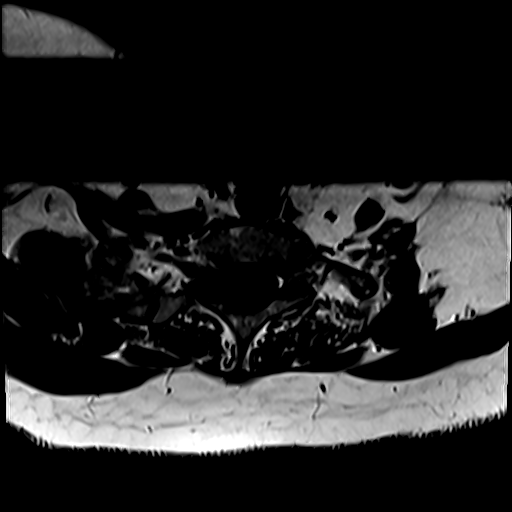
[im 4/30]
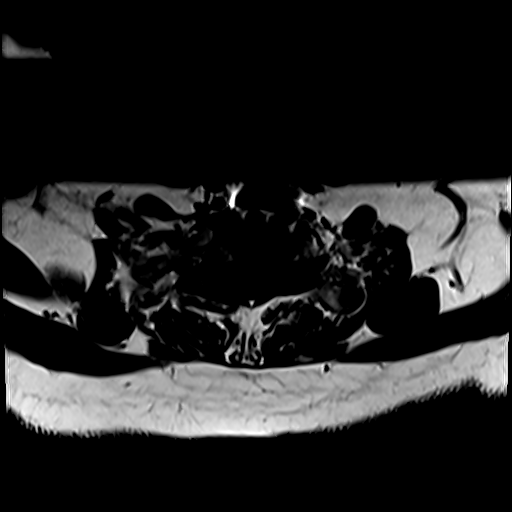
[im 10/30]
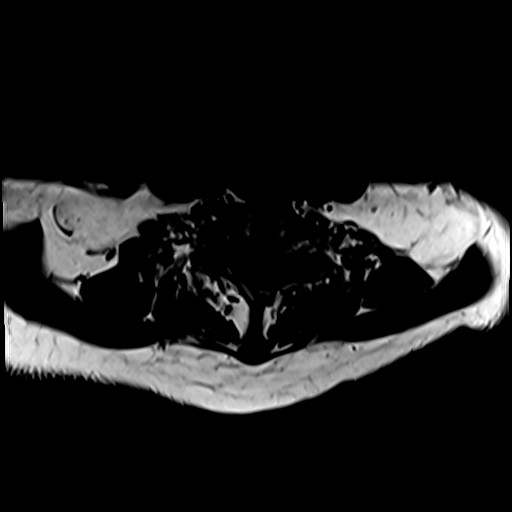
[im 13/30]
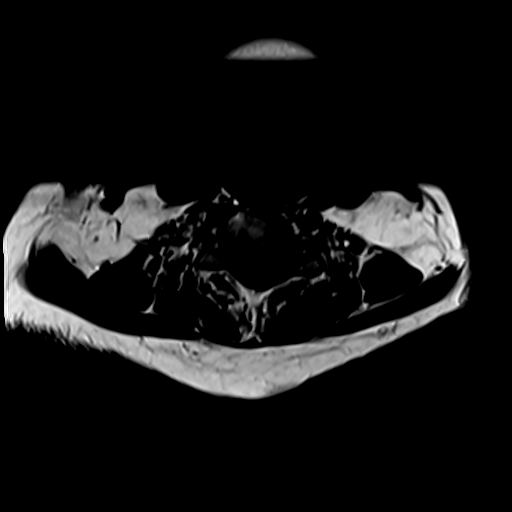
[im 17/30]
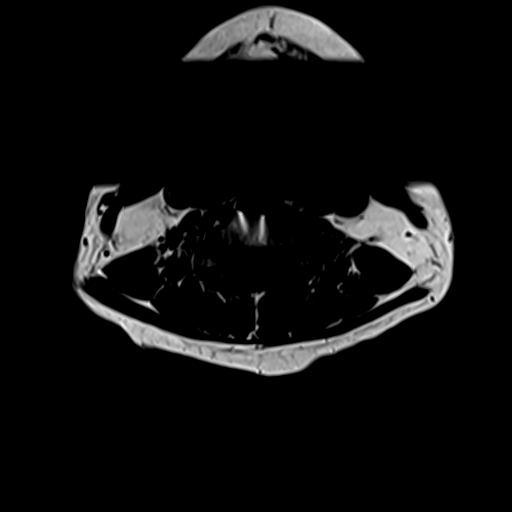
[im 20/30]
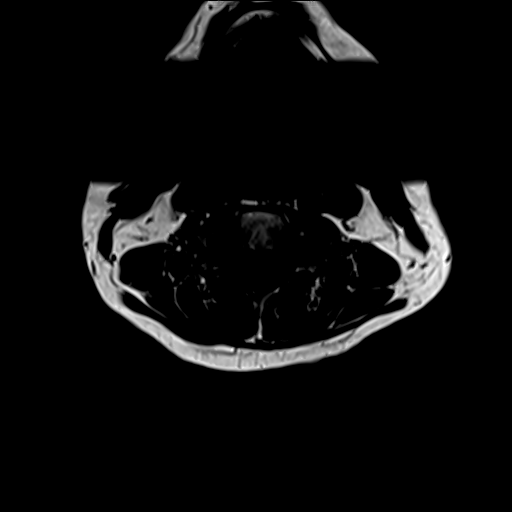
[im 26/30]
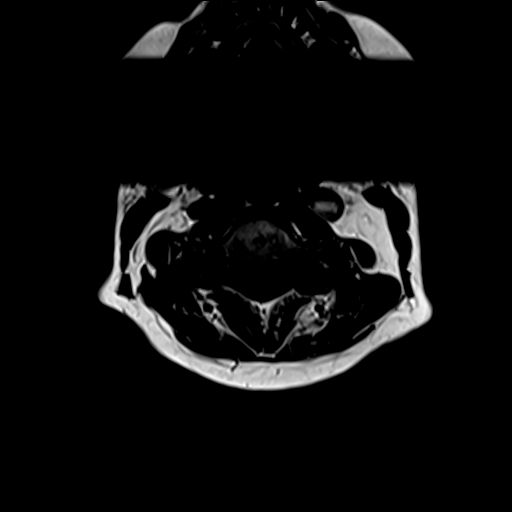
[im 30/30]
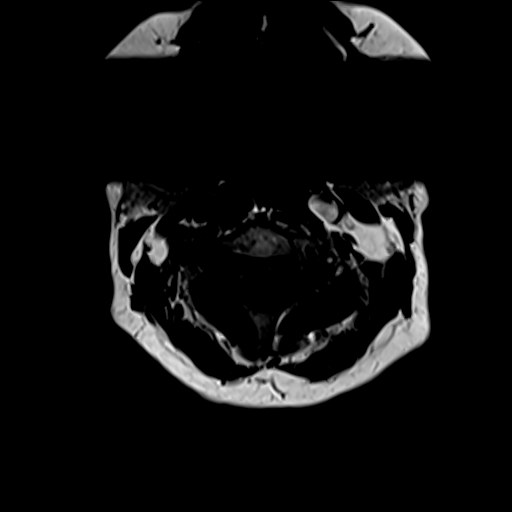

[34 of 48 positions shown; findings below may reference images not displayed]

FINDINGS: Alignment: Straightening of the normal cervical lordosis. One or 2
mm of anterolisthesis at C7-T1.

Vertebrae: Previous ACDF C5-C7.

Cord: No cord compression or focal cord lesion. See below for
details at each level.

Posterior Fossa, vertebral arteries, paraspinal tissues: Negative

Disc levels:

Assimilation of C1 in the skull base, more completely on the left.
The foramen magnum is small due to some hypertrophic degenerative
change at the C1-2 articulation. Subarachnoid space surrounding the
craniocervical junction is narrowed, but the craniocervical junction
is not grossly compressed. AP diameter at this level measures
mm.

C2-3: Mild bulging and uncovertebral prominence on the left. Mild
left-sided facet osteoarthritis. No compressive stenosis.

C3-4: Spondylosis with endplate osteophytes and bulging of the disc.
Narrowing of the ventral subarachnoid space with AP diameter of the
canal measuring 7.2 mm. No frank cord compression. Bilateral
foraminal encroachment that could affect either C4 nerve.

C4-5: Endplate osteophytes and bulging of the disc. Narrowing of the
ventral subarachnoid space. AP diameter of the canal 7.4 mm. Mild
bilateral foraminal narrowing.

C5 through C7: Previous ACDF. Sufficient patency of the canal and
foramina at C5-6 and C6-7. Known chronic screw fracture at C7, but
with probable solid union.

C7-T1: Facet osteoarthritis right worse than left. One or 2 mm of
anterolisthesis. Bulging of the disc. No compressive canal stenosis.
Right foraminal stenosis with likely a C8 nerve compression.
IMPRESSION: Congenital assimilation of C1 and the skull base, more complete on
the left than the right. Hypertrophic degenerative change of the
C1-2 articulation with narrowing of the foramen magnum/upper
cervical spinal canal, AP diameter 11.9 mm. No actual compression of
the craniocervical junction.

C3-4: Degenerative spondylosis. Canal narrowing with AP diameter
mm. No cord compression. Bilateral foraminal stenosis could affect
either C4 nerve.

C4-5: Spondylosis with mild bilateral foraminal narrowing.

C5 through C7: Previous ACDF. Sufficient patency of the canal and
foramina.

C7-T1: Facet arthropathy right worse than left with 1 or 2 mm of
anterolisthesis. Mild bulging of the disc. Severe right foraminal
stenosis likely to compress the right C8 nerve.

In general, these acquired degenerative changes have worsened
slightly since [DATE].

## 2023-07-21 IMAGING — MR MR HEAD W/O CM
10 series · 48 of 48 positions shown · non-contrast
Comparison: Cervical exam same day.  No prior intracranial imaging.

CLINICAL DATA: Dizziness. Nonspecific vertigo. Frequent headaches.

EXAM:
MRI HEAD WITHOUT CONTRAST
TECHNIQUE: Multiplanar, multiecho pulse sequences of the brain and surrounding
structures were obtained without intravenous contrast.

[Series 5: DWI · axial · 3.0mm · 1.36mm/px · z∈[-87,+70]mm · 9 of 107 slices shown (1 of 2)]
[im 1/107]
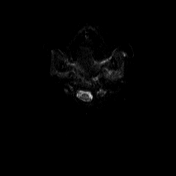
[im 14/107]
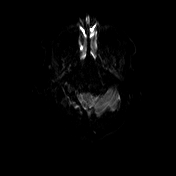
[im 27/107]
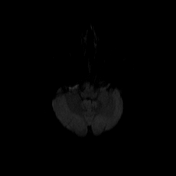
[im 40/107]
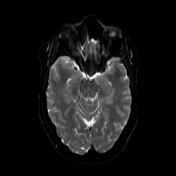
[im 54/107]
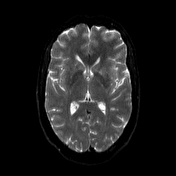
[im 67/107]
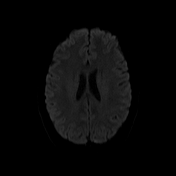
[im 80/107]
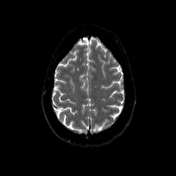
[im 93/107]
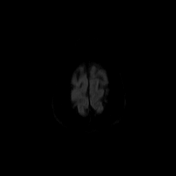
[im 107/107]
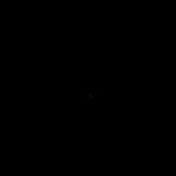

[Series 6: DWI · axial · 3.0mm · 1.36mm/px · z∈[-87,+70]mm · 4 of 54 slices shown (2 of 2)]
[im 1/54]
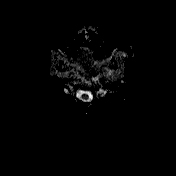
[im 18/54]
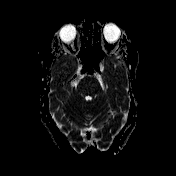
[im 36/54]
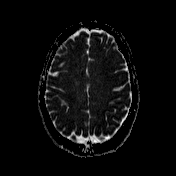
[im 54/54]
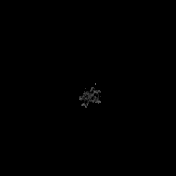

[Series 7: T1 · sagittal · 5.0mm · 0.75mm/px · 2 of 24 slices shown (1 of 2)]
[im 1/24]
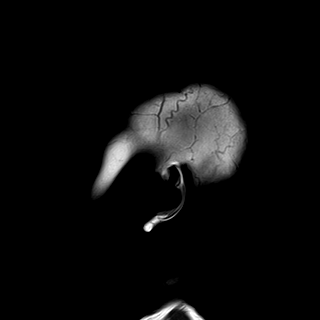
[im 24/24]
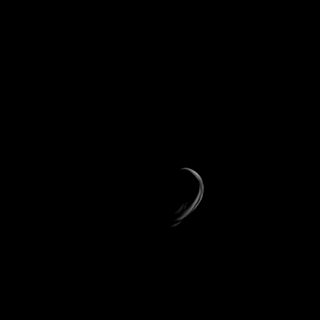

[Series 8: T2 · axial · 5.0mm · 0.62mm/px · z∈[-82,+72]mm · 2 of 25 slices shown (1 of 2)]
[im 1/25]
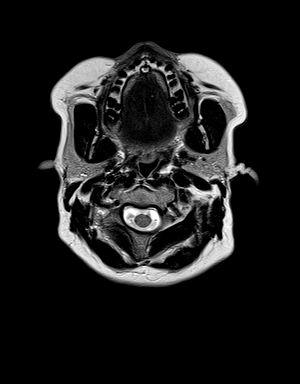
[im 25/25]
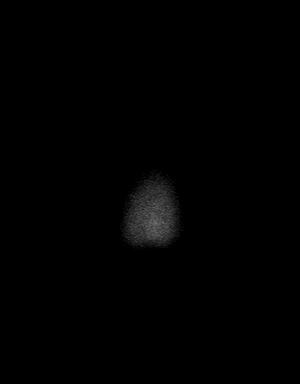

[Series 9: swi_images · axial · 3.0mm · 0.75mm/px · z∈[-81,+71]mm · 4 of 52 slices shown]
[im 1/52]
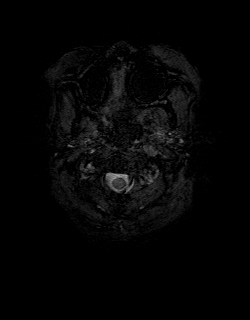
[im 18/52]
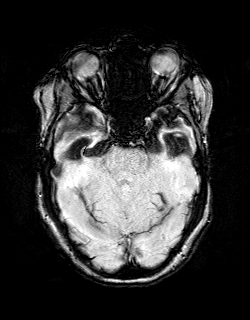
[im 35/52]
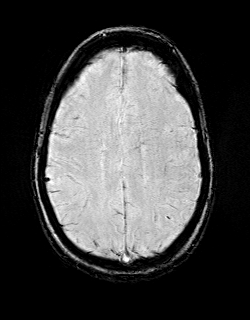
[im 52/52]
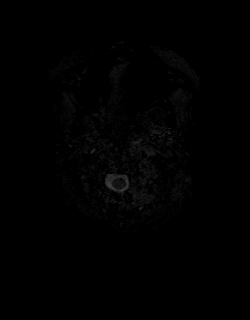

[Series 11: FLAIR · axial · 3.0mm · 0.75mm/px · z∈[-81,+71]mm · 4 of 52 slices shown]
[im 1/52]
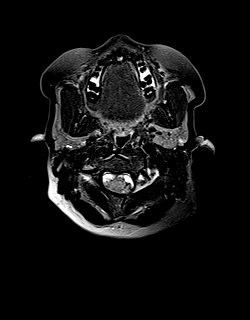
[im 18/52]
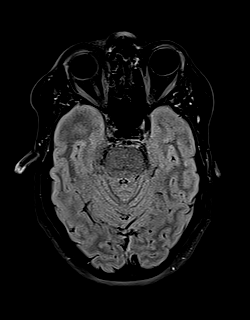
[im 35/52]
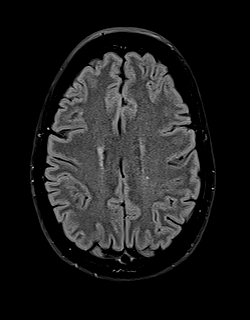
[im 52/52]
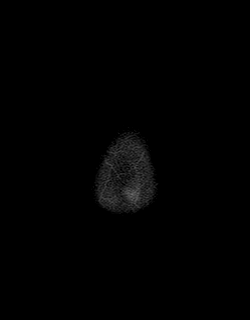

[Series 12: T1 · axial · 1.0mm · 0.94mm/px · z∈[-80,+77]mm · 13 of 160 slices shown (2 of 2)]
[im 1/160]
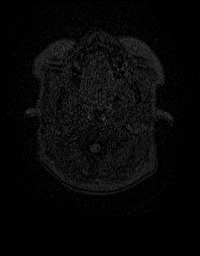
[im 14/160]
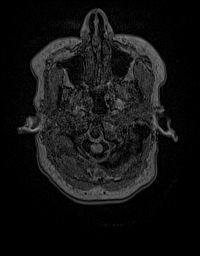
[im 27/160]
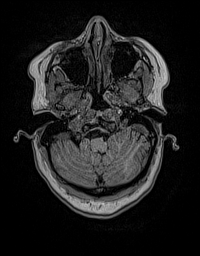
[im 40/160]
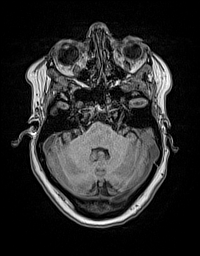
[im 54/160]
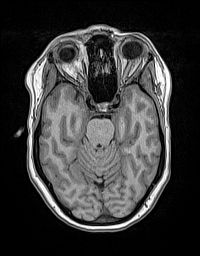
[im 67/160]
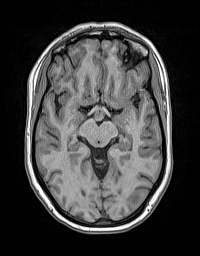
[im 80/160]
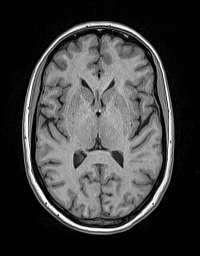
[im 93/160]
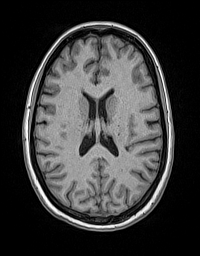
[im 107/160]
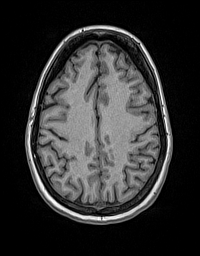
[im 120/160]
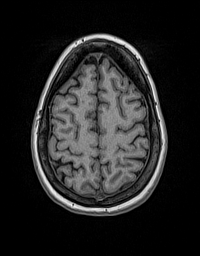
[im 133/160]
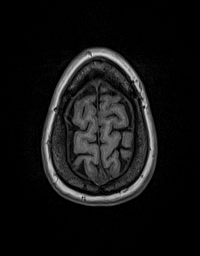
[im 146/160]
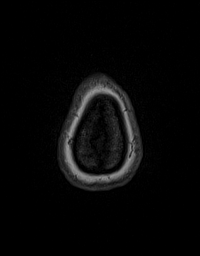
[im 160/160]
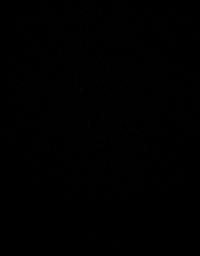

[Series 13: cor dwi_tracew · coronal · 5.0mm · 1.53mm/px · 5 of 60 slices shown]
[im 1/60]
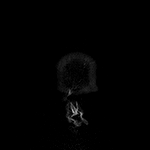
[im 15/60]
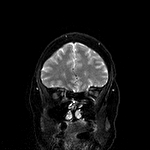
[im 30/60]
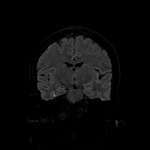
[im 45/60]
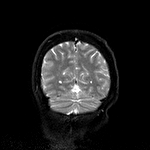
[im 60/60]
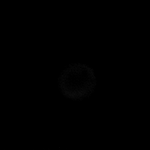

[Series 14: cor dwi_adc · coronal · 5.0mm · 1.53mm/px · 2 of 29 slices shown]
[im 1/29]
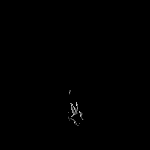
[im 29/29]
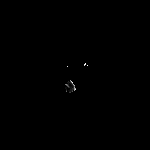

[Series 15: T2 · coronal · 5.0mm · 0.57mm/px · 3 of 39 slices shown (2 of 2)]
[im 1/39]
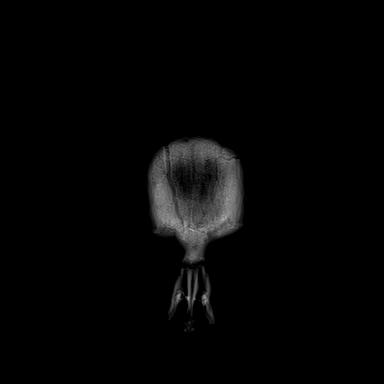
[im 20/39]
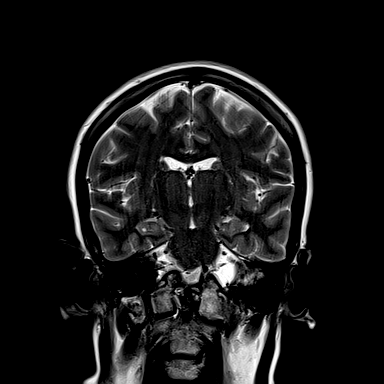
[im 39/39]
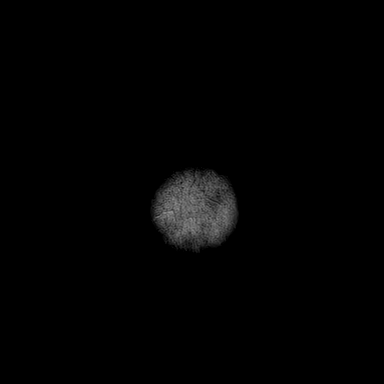

[48 of 48 positions shown; findings below may reference images not displayed]

FINDINGS: Brain: Diffusion imaging does not show any acute or subacute
infarction. There is congenital assimilation of the skull base and
C1 with subsequent degenerative changes at the C1-2 articulation.
There is narrowing of the foramen magnum, without gross compression
of the craniocervical junction. Findings could relate to
craniocervical pain syndromes. No focal insult is seen affecting the
brainstem or cerebellum. Cerebral hemispheres do not show evidence
of old small or large vessel infarction. Dilated perivascular spaces
present within the anterior limb of the internal capsule on left. No
mass, hemorrhage, hydrocephalus or extra-axial collection.

The upper surface of the pituitary gland is convex. Cephalo caudal
measurement of the gland is 8 mm. This raises the possibility of a
pituitary micro adenoma. Does the patient have endocrinologic
disturbance? One could consider a dedicated pituitary MRI with
contrast if there is concern regarding pituitary disease.

Vascular: Major vessels at the base of the brain show flow.

Skull and upper cervical spine: Otherwise negative. See above
discussion regarding congenital assimilation of the skull base and
C1.

Sinuses/Orbits: Clear/normal

Other: None
IMPRESSION: Normal appearance of the brain itself.

Congenital assimilation of the skull base and C1 with hypertrophic
degenerative changes of the C1-2 articulation. Narrowing of the
upper cervical spinal canal/foramen magnum, but without visible
compression of the craniocervical junction. Findings could possibly
relate to craniocervical pain syndromes.

Slightly prominent appearance of the pituitary gland, cephalo caudal
measurement of 8 mm. This could be normal or could indicate the
presence of a pituitary micro adenoma. Particularly if there is any
endocrinologic disturbance, pituitary MRI with and without contrast
could be considered.

## 2023-07-29 ENCOUNTER — Ambulatory Visit
Admission: RE | Admit: 2023-07-29 | Discharge: 2023-07-29 | Disposition: A | Discharge status: Home / Self Care | Payer: 59 | Source: Ambulatory Visit | Attending: Surgery | Admitting: Surgery

## 2023-07-29 DIAGNOSIS — R2233 Localized swelling, mass and lump, upper limb, bilateral: Secondary | ICD-10-CM

## 2023-07-29 DIAGNOSIS — R2231 Localized swelling, mass and lump, right upper limb: Secondary | ICD-10-CM | POA: Diagnosis not present

## 2023-07-29 DIAGNOSIS — R2232 Localized swelling, mass and lump, left upper limb: Secondary | ICD-10-CM | POA: Diagnosis not present

## 2023-07-29 DIAGNOSIS — N644 Mastodynia: Secondary | ICD-10-CM | POA: Diagnosis not present

## 2023-07-29 DIAGNOSIS — N6321 Unspecified lump in the left breast, upper outer quadrant: Secondary | ICD-10-CM | POA: Diagnosis not present

## 2023-07-31 ENCOUNTER — Other Ambulatory Visit: Payer: Self-pay

## 2023-07-31 DIAGNOSIS — I10 Essential (primary) hypertension: Secondary | ICD-10-CM

## 2023-07-31 DIAGNOSIS — D352 Benign neoplasm of pituitary gland: Secondary | ICD-10-CM

## 2023-08-08 DIAGNOSIS — D352 Benign neoplasm of pituitary gland: Secondary | ICD-10-CM | POA: Diagnosis not present

## 2023-08-08 DIAGNOSIS — I10 Essential (primary) hypertension: Secondary | ICD-10-CM | POA: Diagnosis not present

## 2023-08-10 LAB — BASIC METABOLIC PANEL
BUN/Creatinine Ratio: 13 (ref 9–23)
BUN: 13 mg/dL (ref 6–24)
CO2: 27 mmol/L (ref 20–29)
Calcium: 10.1 mg/dL (ref 8.7–10.2)
Chloride: 107 mmol/L — ABNORMAL HIGH (ref 96–106)
Creatinine, Ser: 1 mg/dL (ref 0.57–1.00)
Glucose: 91 mg/dL (ref 70–99)
Potassium: 5.1 mmol/L (ref 3.5–5.2)
Sodium: 147 mmol/L — ABNORMAL HIGH (ref 134–144)
eGFR: 67 mL/min/{1.73_m2} (ref >59)

## 2023-08-10 LAB — LIPID PANEL
Chol/HDL Ratio: 1.9 {ratio} (ref 0.0–4.4)
Cholesterol, Total: 135 mg/dL (ref 100–199)
HDL: 70 mg/dL (ref >39)
LDL Chol Calc (NIH): 54 mg/dL (ref 0–99)
Triglycerides: 51 mg/dL (ref 0–149)
VLDL Cholesterol Cal: 11 mg/dL (ref 5–40)

## 2023-08-10 LAB — TSH: TSH: 3.84 u[IU]/mL (ref 0.450–4.500)

## 2023-08-10 LAB — MICROALBUMIN / CREATININE URINE RATIO
Creatinine, Urine: 271.1 mg/dL
Microalb/Creat Ratio: 6 mg/g{creat} (ref 0–29)
Microalbumin, Urine: 16.3 ug/mL

## 2023-08-10 LAB — ACTH: ACTH: 18.5 pg/mL (ref 7.2–63.3)

## 2023-08-10 LAB — FSH/LH
FSH: 105 m[IU]/mL
LH: 54.2 m[IU]/mL

## 2023-08-10 LAB — CORTISOL: Cortisol: 16 ug/dL (ref 6.2–19.4)

## 2023-08-10 LAB — PROLACTIN: Prolactin: 12.9 ng/mL (ref 3.6–25.2)

## 2023-08-10 LAB — GROWTH HORMONE: Growth Hormone: 0.1 ng/mL (ref 0.0–10.0)

## 2023-08-12 ENCOUNTER — Ambulatory Visit (INDEPENDENT_AMBULATORY_CARE_PROVIDER_SITE_OTHER): Payer: 59 | Admitting: Family Medicine

## 2023-08-12 VITALS — BP 122/81 | HR 95 | Temp 98.6°F | Ht 63.0 in | Wt 172.6 lb

## 2023-08-12 DIAGNOSIS — R0609 Other forms of dyspnea: Secondary | ICD-10-CM | POA: Diagnosis not present

## 2023-08-12 DIAGNOSIS — D352 Benign neoplasm of pituitary gland: Secondary | ICD-10-CM

## 2023-08-12 DIAGNOSIS — I10 Essential (primary) hypertension: Secondary | ICD-10-CM

## 2023-08-12 NOTE — Progress Notes (Signed)
Subjective:    Patient ID: Stephanie Bishop, female    DOB: Jan 22, 1966, 57 y.o.   MRN: [id number]  HPI The 10-year ASCVD risk score (Arnett DK, et al., 2019) is: 1.3%   Values used to calculate the score:     Age: 72 years     Sex: Female     Is Non-Hispanic African American: No     Diabetic: No     Tobacco smoker: No     Systolic Blood Pressure: 122 mmHg     Is BP treated: Yes     HDL Cholesterol: 70 mg/dL     Total Cholesterol: 135 mg/dL  Patient was concerned about the possibility of underlying heart disease.  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 Primary hypertension Blood pressure good control continue current measures healthy diet  2. Pituitary microadenoma (HCC) Stable MRI stable lab work will touch base with neurology to see how often they would recommend following up on this  3. DOE (dyspnea on exertion) Minimal.  No alarming symptoms.  Lipid profile looks very favorable.  Patient unlikely to have underlying heart disease but we did discuss echo chest x-ray and possibility of coronary calcium scan after shared discussion we will hold off on this currently but if she has progressive symptoms she will note this we will pursue forward with these testing she will follow-up within 6 months  Stress  related issues trying to work through this is best she can

## 2023-08-18 ENCOUNTER — Ambulatory Visit (INDEPENDENT_AMBULATORY_CARE_PROVIDER_SITE_OTHER): Payer: 59 | Admitting: Gastroenterology

## 2023-09-28 ENCOUNTER — Other Ambulatory Visit: Payer: Self-pay | Admitting: Family Medicine

## 2023-10-13 DIAGNOSIS — M8580 Other specified disorders of bone density and structure, unspecified site: Secondary | ICD-10-CM | POA: Diagnosis not present

## 2023-10-13 DIAGNOSIS — N841 Polyp of cervix uteri: Secondary | ICD-10-CM | POA: Diagnosis not present

## 2023-10-13 DIAGNOSIS — Z6831 Body mass index (BMI) 31.0-31.9, adult: Secondary | ICD-10-CM | POA: Diagnosis not present

## 2023-10-13 DIAGNOSIS — Z01419 Encounter for gynecological examination (general) (routine) without abnormal findings: Secondary | ICD-10-CM | POA: Diagnosis not present

## 2023-10-13 DIAGNOSIS — N952 Postmenopausal atrophic vaginitis: Secondary | ICD-10-CM | POA: Diagnosis not present

## 2023-10-13 DIAGNOSIS — N819 Female genital prolapse, unspecified: Secondary | ICD-10-CM | POA: Diagnosis not present

## 2023-11-17 DIAGNOSIS — Z13820 Encounter for screening for osteoporosis: Secondary | ICD-10-CM | POA: Diagnosis not present

## 2023-12-02 ENCOUNTER — Ambulatory Visit
Admission: EM | Admit: 2023-12-02 | Discharge: 2023-12-02 | Disposition: A | Discharge status: Home / Self Care | Payer: 59 | Attending: Nurse Practitioner | Admitting: Nurse Practitioner

## 2023-12-02 DIAGNOSIS — R059 Cough, unspecified: Secondary | ICD-10-CM | POA: Diagnosis not present

## 2023-12-02 DIAGNOSIS — T5991XA Toxic effect of unspecified gases, fumes and vapors, accidental (unintentional), initial encounter: Secondary | ICD-10-CM

## 2023-12-02 MED ORDER — PREDNISONE 20 MG PO TABS: 20.0000 mg | ORAL_TABLET | Freq: Every day | ORAL | 0 refills | Status: AC

## 2023-12-02 MED ORDER — PROMETHAZINE-DM 6.25-15 MG/5ML PO SYRP: 5.0000 mL | ORAL_SOLUTION | Freq: Four times a day (QID) | ORAL | 0 refills | Status: DC | PRN

## 2023-12-02 MED ORDER — ALBUTEROL SULFATE HFA 108 (90 BASE) MCG/ACT IN AERS: 2.0000 | INHALATION_SPRAY | Freq: Four times a day (QID) | RESPIRATORY_TRACT | 2 refills | Status: AC | PRN
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
## 2023-12-02 NOTE — Discharge Instructions (Addendum)
Take medication as prescribed.  Continue your current allergy regimen. Increase fluids and allow for plenty of rest. May continue over-the-counter Tylenol or ibuprofen as needed for pain, fever, or general discomfort. Recommend using a humidifier at nighttime during sleep and sleeping elevated on pillows while cough symptoms persist. As discussed, if symptoms are not improving over the next several days, or appear to be worsening, you may follow-up in this clinic or with your primary care physician for further evaluation. Follow-up as needed.
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
## 2024-02-02 ENCOUNTER — Encounter (INDEPENDENT_AMBULATORY_CARE_PROVIDER_SITE_OTHER): Payer: Self-pay | Admitting: Gastroenterology

## 2024-02-02 ENCOUNTER — Ambulatory Visit (INDEPENDENT_AMBULATORY_CARE_PROVIDER_SITE_OTHER): Payer: 59 | Admitting: Gastroenterology

## 2024-02-02 VITALS — BP 131/84 | HR 93 | Temp 97.1°F | Ht 63.0 in | Wt 175.3 lb

## 2024-02-02 DIAGNOSIS — K219 Gastro-esophageal reflux disease without esophagitis: Secondary | ICD-10-CM | POA: Diagnosis not present

## 2024-02-02 NOTE — Progress Notes (Signed)
 Katrinka Blazing, M.D. Gastroenterology & Hepatology [facility] Gastroenterology [address] [place], [place] [address]  Primary Care Physician: Babs Sciara, MD [address] [place] [address]  I will communicate my assessment and recommendations to the referring MD via EMR.  Problems: GERD  History of Present Illness: MORNINGSTAR TOFT is a 58 y.o. female with past medical history of GERD and hypertension,  who presents for follow up of GERD.  The patient was last seen on 01/28/2023. At that time, the patient was continued on PPI daily.  Patient reports that she would have heartburn if she eats tomato based food, spicy, coffee and chocolate, she will have heartburn. As long as she abstains from this and takes pantoprazole 40 mg qday, She will be asymptomatic. No dysphagia or odynophagia. Sometimes takes an extra dose of pantoprazole   Abdominal pain has resolved.  The patient denies having any nausea, vomiting, fever, chills, hematochezia, melena, hematemesis, abdominal distention, abdominal pain, diarrhea, jaundice, pruritus or weight loss.  Last Colonoscopy:11/2022 - The examined portion of the ileum was normal.                           - One 8 mm polyp in the ascending colon                           - One 3 mm polyp in the transverse colon                           - The distal rectum and anal verge are normal on                            retroflexion view. (1 SSL and 1 TA) Last Endoscopy: 11/2022 - 1 cm hiatal hernia.                           - Gastroesophageal flap valve classified as Hill                            Grade II (fold present, opens with respiration).                           - Normal stomach. Biopsied-focal intestinal metaplasia                           - Normal examined duodenum. Biopsied-Normal    Recommendations:  Repeat EGD 3 years, gastric mapping. Repeat colonoscopy 5 years   Past Medical History: Past  Medical History:  Diagnosis Date   GERD (gastroesophageal reflux disease)    Hypertension    PONV (postoperative nausea and vomiting)     Past Surgical History: Past Surgical History:  Procedure Laterality Date   BIOPSY  05/12/2019   Procedure: BIOPSY;  Surgeon: Malissa Hippo, MD;  [facility];  Service: Endoscopy;;  duodenum, antrum   BIOPSY  11/13/2022   Procedure: BIOPSY;  Surgeon: Dolores Frame, MD;  [facility];  Service: Gastroenterology;;   BREAST BIOPSY Left 2012   benign   BREAST CYST ASPIRATION Bilateral    CERVICAL SPINE SURGERY  2001   COLONOSCOPY  07/16/2012   Procedure: COLONOSCOPY;  Surgeon: Malissa Hippo, MD;  [facility]: [facility];  Service: Endoscopy;  Laterality: N/A;  930   COLONOSCOPY WITH PROPOFOL N/A 11/13/2022   Procedure: COLONOSCOPY WITH PROPOFOL;  Surgeon: Dolores Frame, MD;  [facility];  Service: Gastroenterology;  Laterality: N/A;  800 ASA 2   ESOPHAGOGASTRODUODENOSCOPY N/A 05/12/2019   Procedure: ESOPHAGOGASTRODUODENOSCOPY (EGD);  Surgeon: Malissa Hippo, MD;  [facility];  Service: Endoscopy;  Laterality: N/A;  245   ESOPHAGOGASTRODUODENOSCOPY (EGD) WITH PROPOFOL N/A 11/13/2022   Procedure: ESOPHAGOGASTRODUODENOSCOPY (EGD) WITH PROPOFOL;  Surgeon: Dolores Frame, MD;  [facility];  Service: Gastroenterology;  Laterality: N/A;   FOOT SURGERY Right    06/2020   FOOT SURGERY Left 09/20/2022   POLYPECTOMY  11/13/2022   Procedure: POLYPECTOMY;  Surgeon: Dolores Frame, MD;  [facility]: [facility];  Service: Gastroenterology;;    Family History: Family History  Problem Relation Age of Onset   Colon cancer Neg Hx     Social History: Social History   Tobacco Use  Smoking Status Never   Passive exposure: Never  Smokeless Tobacco Never   Social History   Substance and Sexual Activity  Alcohol Use No   Social History   Substance  and Sexual Activity  Drug Use No    Allergies: Allergies  Allergen Reactions   Prednisone     Gives her migraines   Ciprofloxacin Other (See Comments)    Knots on Face     Medications: Current Outpatient Medications  Medication Sig Dispense Refill   acetaminophen (TYLENOL) 500 MG tablet Take 1,000 mg by mouth every 6 (six) hours as needed for moderate pain.     albuterol (VENTOLIN HFA) 108 (90 Base) MCG/ACT inhaler Inhale 2 puffs into the lungs every 6 (six) hours as needed for wheezing or shortness of breath. 8 g 2   amLODipine (NORVASC) 2.5 MG tablet Take 1 tablet by mouth once daily 90 tablet 0   calcium elemental as carbonate (TUMS ULTRA 1000) 400 MG chewable tablet Chew 2,000 mg by mouth at bedtime.     cetirizine (ZYRTEC) 10 MG tablet Take 10 mg by mouth daily as needed for allergies.      Cholecalciferol (VITAMIN D) 50 MCG (2000 UT) tablet Take 2,000 Units by mouth every other day.     fluticasone (FLONASE) 50 MCG/ACT nasal spray Place 1 spray into both nostrils daily as needed for allergies or rhinitis.     hydrocortisone cream 1 % Apply 1 Application topically 2 (two) times daily as needed (dry / itchy skin).     Multiple Vitamin (MULTIVITAMIN WITH MINERALS) TABS tablet Take 1 tablet by mouth every other day.     pantoprazole (PROTONIX) 40 MG tablet Take 1 tablet by mouth twice daily (Patient taking differently: Take 40 mg by mouth daily. Take 1 tablet by mouth twice daily) 180 tablet 3   No current facility-administered medications for this visit.    Review of Systems: GENERAL: negative for malaise, night sweats HEENT: No changes in hearing or vision, no nose bleeds or other nasal problems. NECK: Negative for lumps, goiter, pain and significant neck swelling RESPIRATORY: Negative for cough, wheezing CARDIOVASCULAR: Negative for chest pain, leg swelling, palpitations, orthopnea GI: SEE HPI MUSCULOSKELETAL: Negative for joint pain or swelling, back pain, and muscle  pain. SKIN: Negative for lesions, rash PSYCH: Negative for sleep disturbance, mood disorder and recent psychosocial stressors. HEMATOLOGY  Negative for prolonged bleeding, bruising easily, and swollen nodes. ENDOCRINE: Negative for cold or heat intolerance, polyuria, polydipsia and goiter. NEURO: negative for tremor, gait imbalance, syncope and seizures. The remainder of the review of systems is noncontributory.   Physical Exam: BP 131/84 (BP Location: Left Arm, Patient Position: Sitting, Cuff Size: Large)   Pulse 93   Temp (!) 97.1 F (36.2 C) (Temporal)   Ht 5\' 3"  (1.6 m)   Wt 175 lb 4.8 oz (79.5 kg)   LMP 10/11/2018   BMI 31.05 kg/m  GENERAL: The patient is AO x3, in no acute distress. HEENT: Head is normocephalic and atraumatic. EOMI are intact. Mouth is well hydrated and without lesions. NECK: Supple. No masses LUNGS: Clear to auscultation. No presence of rhonchi/wheezing/rales. Adequate chest expansion HEART: RRR, normal s1 and s2. ABDOMEN: Soft, nontender, no guarding, no peritoneal signs, and nondistended. BS +. No masses. EXTREMITIES: Without any cyanosis, clubbing, rash, lesions or edema. NEUROLOGIC: AOx3, no focal motor deficit. SKIN: no jaundice, no rashes  Imaging/Labs: as above  I personally reviewed and interpreted the available labs, imaging and endoscopic files.  Impression and Plan: LUVIA ORZECHOWSKI is a 58 y.o. female with past medical history of GERD and hypertension,  who presents for follow up of GERD.  Patient has presented adequate control of her GERD while taking moderate dose PPI daily and avoiding triggering foods.  Denies any complaints or red flag signs.  In fact her abdominal pain has improved.  He should continue current dietary changes and taking pantoprazole daily.  - Continue pantoprazole 40 mg daily, can take an extra dose if needed  All questions were answered.      Katrinka Blazing, MD Gastroenterology and Hepatology [facility]  Gastroenterology

## 2024-02-02 NOTE — Patient Instructions (Addendum)
 Continue pantoprazole 40 mg daily, can take an extra dose if needed

## 2024-02-04 ENCOUNTER — Other Ambulatory Visit: Payer: Self-pay

## 2024-02-04 ENCOUNTER — Other Ambulatory Visit: Payer: Self-pay | Admitting: Family Medicine

## 2024-02-04 DIAGNOSIS — I10 Essential (primary) hypertension: Secondary | ICD-10-CM

## 2024-02-04 MED ORDER — AMLODIPINE BESYLATE 2.5 MG PO TABS: 2.5000 mg | ORAL_TABLET | Freq: Every day | ORAL | 3 refills | Status: AC

## 2024-07-07 ENCOUNTER — Other Ambulatory Visit: Payer: Self-pay | Admitting: Obstetrics and Gynecology

## 2024-07-07 DIAGNOSIS — Z1231 Encounter for screening mammogram for malignant neoplasm of breast: Secondary | ICD-10-CM

## 2024-07-30 ENCOUNTER — Ambulatory Visit
Admission: RE | Admit: 2024-07-30 | Discharge: 2024-07-30 | Disposition: A | Discharge status: Home / Self Care | Source: Ambulatory Visit | Attending: Obstetrics and Gynecology | Admitting: Obstetrics and Gynecology

## 2024-07-30 DIAGNOSIS — Z1231 Encounter for screening mammogram for malignant neoplasm of breast: Secondary | ICD-10-CM | POA: Diagnosis not present

## 2024-08-18 ENCOUNTER — Encounter (INDEPENDENT_AMBULATORY_CARE_PROVIDER_SITE_OTHER): Payer: Self-pay | Admitting: Gastroenterology

## 2024-10-15 DIAGNOSIS — N952 Postmenopausal atrophic vaginitis: Secondary | ICD-10-CM | POA: Diagnosis not present

## 2024-10-15 DIAGNOSIS — Z01419 Encounter for gynecological examination (general) (routine) without abnormal findings: Secondary | ICD-10-CM | POA: Diagnosis not present

## 2024-10-15 DIAGNOSIS — Z124 Encounter for screening for malignant neoplasm of cervix: Secondary | ICD-10-CM | POA: Diagnosis not present

## 2024-10-15 DIAGNOSIS — Z6830 Body mass index (BMI) 30.0-30.9, adult: Secondary | ICD-10-CM | POA: Diagnosis not present

## 2024-10-15 DIAGNOSIS — N814 Uterovaginal prolapse, unspecified: Secondary | ICD-10-CM | POA: Diagnosis not present
